# Patient Record
Sex: Male | Born: 1955 | Race: White | Hispanic: No | Smoking: Never smoker
Health system: Southern US, Community
[De-identification: ages and names within clinical notes are randomized; demographics above are authoritative.]

## PROBLEM LIST (undated history)

## (undated) DIAGNOSIS — I10 Essential (primary) hypertension: Secondary | ICD-10-CM

## (undated) DIAGNOSIS — E119 Type 2 diabetes mellitus without complications: Secondary | ICD-10-CM

## (undated) DIAGNOSIS — I499 Cardiac arrhythmia, unspecified: Secondary | ICD-10-CM

## (undated) DIAGNOSIS — M542 Cervicalgia: Secondary | ICD-10-CM

## (undated) DIAGNOSIS — Z95810 Presence of automatic (implantable) cardiac defibrillator: Secondary | ICD-10-CM

## (undated) DIAGNOSIS — C189 Malignant neoplasm of colon, unspecified: Secondary | ICD-10-CM

## (undated) DIAGNOSIS — E785 Hyperlipidemia, unspecified: Secondary | ICD-10-CM

## (undated) DIAGNOSIS — I509 Heart failure, unspecified: Secondary | ICD-10-CM

## (undated) DIAGNOSIS — I429 Cardiomyopathy, unspecified: Secondary | ICD-10-CM

## (undated) HISTORY — DX: Cardiomyopathy, unspecified: I42.9

## (undated) HISTORY — DX: Cardiac arrhythmia, unspecified: I49.9

## (undated) HISTORY — DX: Essential (primary) hypertension: I10

## (undated) HISTORY — DX: Type 2 diabetes mellitus without complications: E11.9

## (undated) HISTORY — DX: Malignant neoplasm of colon, unspecified: C18.9

## (undated) HISTORY — PX: CARDIAC CATHETERIZATION: SHX172

## (undated) HISTORY — DX: Cervicalgia: M54.2

---

## 2003-08-06 HISTORY — PX: ROTATOR CUFF REPAIR: SHX139

## 2007-08-10 ENCOUNTER — Ambulatory Visit: Payer: Self-pay | Admitting: Family Medicine

## 2007-08-11 ENCOUNTER — Ambulatory Visit: Payer: Self-pay | Admitting: Internal Medicine

## 2007-12-15 ENCOUNTER — Ambulatory Visit: Payer: Self-pay

## 2008-01-24 ENCOUNTER — Ambulatory Visit: Payer: Self-pay | Admitting: Orthopaedic Surgery

## 2008-11-09 ENCOUNTER — Inpatient Hospital Stay: Payer: Self-pay | Admitting: Internal Medicine

## 2012-10-05 DIAGNOSIS — C189 Malignant neoplasm of colon, unspecified: Secondary | ICD-10-CM

## 2012-10-05 HISTORY — PX: INSERTION CENTRAL VENOUS ACCESS DEVICE W/ SUBCUTANEOUS PORT: SUR725

## 2012-10-05 HISTORY — DX: Malignant neoplasm of colon, unspecified: C18.9

## 2013-03-30 ENCOUNTER — Observation Stay: Payer: Self-pay | Admitting: Internal Medicine

## 2013-03-30 LAB — COMPREHENSIVE METABOLIC PANEL
BUN: 13 mg/dL (ref 7–18)
Bilirubin,Total: 0.8 mg/dL (ref 0.2–1.0)
Calcium, Total: 10.4 mg/dL — ABNORMAL HIGH (ref 8.5–10.1)
Chloride: 98 mmol/L (ref 98–107)
Co2: 30 mmol/L (ref 21–32)
Creatinine: 1.41 mg/dL — ABNORMAL HIGH (ref 0.60–1.30)
EGFR (African American): >60
EGFR (Non-African Amer.): 55 — ABNORMAL LOW
Potassium: 4.1 mmol/L (ref 3.5–5.1)
SGOT(AST): 32 U/L (ref 15–37)
SGPT (ALT): 51 U/L (ref 12–78)
Sodium: 134 mmol/L — ABNORMAL LOW (ref 136–145)
Total Protein: 8.6 g/dL — ABNORMAL HIGH (ref 6.4–8.2)

## 2013-03-30 LAB — TROPONIN I: Troponin-I: <0.02 ng/mL

## 2013-03-30 LAB — URINALYSIS, COMPLETE
Bacteria: NONE SEEN
Bilirubin,UR: NEGATIVE
Blood: NEGATIVE
Glucose,UR: >=500 mg/dL (ref 0–75)
Ketone: NEGATIVE
Nitrite: NEGATIVE
Ph: 6 (ref 4.5–8.0)
RBC,UR: 3 /HPF (ref 0–5)
Squamous Epithelial: <1

## 2013-03-30 LAB — CBC
HGB: 16 g/dL (ref 13.0–18.0)
MCH: 31.3 pg (ref 26.0–34.0)
RBC: 5.12 10*6/uL (ref 4.40–5.90)
RDW: 13.3 % (ref 11.5–14.5)
WBC: 14.8 10*3/uL — ABNORMAL HIGH (ref 3.8–10.6)

## 2013-03-30 LAB — HEMOGLOBIN A1C: Hemoglobin A1C: 12 % — ABNORMAL HIGH (ref 4.2–6.3)

## 2013-03-30 LAB — BETA-HYDROXYBUTYRIC ACID: Beta-Hydroxybutyrate: 1 mg/dL (ref 0.2–2.8)

## 2013-03-31 LAB — CBC WITH DIFFERENTIAL/PLATELET
Basophil %: 0.9 %
Eosinophil %: 3.5 %
Lymphocyte %: 38.3 %
MCH: 31.2 pg (ref 26.0–34.0)
Monocyte #: 0.8 x10 3/mm (ref 0.2–1.0)
Monocyte %: 7.3 %
Platelet: 165 10*3/uL (ref 150–440)
RDW: 13.1 % (ref 11.5–14.5)
WBC: 10.4 10*3/uL (ref 3.8–10.6)

## 2013-03-31 LAB — BASIC METABOLIC PANEL
Anion Gap: 6 — ABNORMAL LOW (ref 7–16)
BUN: 10 mg/dL (ref 7–18)
Calcium, Total: 8.7 mg/dL (ref 8.5–10.1)
Chloride: 105 mmol/L (ref 98–107)
Creatinine: 1.09 mg/dL (ref 0.60–1.30)
Potassium: 3.4 mmol/L — ABNORMAL LOW (ref 3.5–5.1)

## 2013-04-05 LAB — CULTURE, BLOOD (SINGLE)

## 2013-04-28 ENCOUNTER — Ambulatory Visit: Payer: Self-pay | Admitting: Unknown Physician Specialty

## 2013-05-18 ENCOUNTER — Ambulatory Visit: Payer: Self-pay | Admitting: Surgery

## 2013-05-18 LAB — CBC
HGB: 14.1 g/dL (ref 13.0–18.0)
MCHC: 34.9 g/dL (ref 32.0–36.0)
Platelet: 207 10*3/uL (ref 150–440)
RDW: 13.1 % (ref 11.5–14.5)
WBC: 10.3 10*3/uL (ref 3.8–10.6)

## 2013-05-18 LAB — BASIC METABOLIC PANEL
Calcium, Total: 9.6 mg/dL (ref 8.5–10.1)
Chloride: 106 mmol/L (ref 98–107)
Creatinine: 0.97 mg/dL (ref 0.60–1.30)
Glucose: 166 mg/dL — ABNORMAL HIGH (ref 65–99)
Potassium: 4.2 mmol/L (ref 3.5–5.1)

## 2013-05-25 ENCOUNTER — Inpatient Hospital Stay: Payer: Self-pay | Admitting: Surgery

## 2013-05-25 HISTORY — PX: COLECTOMY: SHX59

## 2013-05-25 LAB — PLATELET COUNT: Platelet: 192 10*3/uL (ref 150–440)

## 2013-05-26 LAB — CBC WITH DIFFERENTIAL/PLATELET
Eosinophil #: 0.3 10*3/uL (ref 0.0–0.7)
Lymphocyte %: 28 %
MCH: 31.4 pg (ref 26.0–34.0)
Monocyte %: 9.5 %
Neutrophil #: 8 10*3/uL — ABNORMAL HIGH (ref 1.4–6.5)
Neutrophil %: 59.6 %
RBC: 4.36 10*6/uL — ABNORMAL LOW (ref 4.40–5.90)

## 2013-05-26 LAB — BASIC METABOLIC PANEL
Anion Gap: 7 (ref 7–16)
BUN: 9 mg/dL (ref 7–18)
Calcium, Total: 8.7 mg/dL (ref 8.5–10.1)
Chloride: 102 mmol/L (ref 98–107)
Co2: 26 mmol/L (ref 21–32)
EGFR (African American): >60
EGFR (Non-African Amer.): >60
Osmolality: 274 (ref 275–301)
Potassium: 3.7 mmol/L (ref 3.5–5.1)
Sodium: 135 mmol/L — ABNORMAL LOW (ref 136–145)

## 2013-05-31 LAB — PATHOLOGY REPORT

## 2013-06-12 ENCOUNTER — Ambulatory Visit: Payer: Self-pay | Admitting: Internal Medicine

## 2013-06-22 DIAGNOSIS — I429 Cardiomyopathy, unspecified: Secondary | ICD-10-CM

## 2013-06-22 DIAGNOSIS — I499 Cardiac arrhythmia, unspecified: Secondary | ICD-10-CM

## 2013-06-22 HISTORY — DX: Cardiomyopathy, unspecified: I42.9

## 2013-06-22 HISTORY — PX: BLADDER SURGERY: SHX569

## 2013-06-22 HISTORY — DX: Cardiac arrhythmia, unspecified: I49.9

## 2013-06-26 ENCOUNTER — Ambulatory Visit: Payer: Self-pay | Admitting: Surgery

## 2013-06-27 LAB — PROTIME-INR: Prothrombin Time: 12.5 secs (ref 11.5–14.7)

## 2013-06-27 LAB — CBC CANCER CENTER
Basophil %: 0.9 %
Eosinophil #: 0.4 x10 3/mm (ref 0.0–0.7)
HGB: 14.9 g/dL (ref 13.0–18.0)
Lymphocyte #: 3.1 x10 3/mm (ref 1.0–3.6)
Lymphocyte %: 34.9 %
MCHC: 33.7 g/dL (ref 32.0–36.0)
Neutrophil %: 51.5 %
RBC: 4.95 10*6/uL (ref 4.40–5.90)
RDW: 13.2 % (ref 11.5–14.5)
WBC: 8.9 x10 3/mm (ref 3.8–10.6)

## 2013-06-27 LAB — COMPREHENSIVE METABOLIC PANEL
Albumin: 3.9 g/dL (ref 3.4–5.0)
Alkaline Phosphatase: 99 U/L (ref 50–136)
Anion Gap: 9 (ref 7–16)
Calcium, Total: 9.9 mg/dL (ref 8.5–10.1)
Co2: 26 mmol/L (ref 21–32)
Creatinine: 1.06 mg/dL (ref 0.60–1.30)
EGFR (African American): >60
EGFR (Non-African Amer.): >60
Potassium: 4.5 mmol/L (ref 3.5–5.1)
SGPT (ALT): 30 U/L (ref 12–78)
Sodium: 135 mmol/L — ABNORMAL LOW (ref 136–145)
Total Protein: 7.9 g/dL (ref 6.4–8.2)

## 2013-06-29 LAB — CEA: CEA: 0.9 ng/mL (ref 0.0–4.7)

## 2013-07-05 ENCOUNTER — Ambulatory Visit: Payer: Self-pay | Admitting: Internal Medicine

## 2013-07-11 ENCOUNTER — Ambulatory Visit: Payer: Self-pay | Admitting: Internal Medicine

## 2013-07-19 LAB — CBC CANCER CENTER
Basophil %: 0.7 %
Eosinophil #: 0.4 x10 3/mm (ref 0.0–0.7)
HCT: 41 % (ref 40.0–52.0)
HGB: 14.1 g/dL (ref 13.0–18.0)
Lymphocyte #: 2.7 x10 3/mm (ref 1.0–3.6)
MCV: 89 fL (ref 80–100)
Neutrophil #: 5.1 x10 3/mm (ref 1.4–6.5)
Neutrophil %: 59 %
Platelet: 190 x10 3/mm (ref 150–440)
RDW: 13.1 % (ref 11.5–14.5)
WBC: 8.7 x10 3/mm (ref 3.8–10.6)

## 2013-07-26 LAB — CBC CANCER CENTER
Basophil #: 0.1 x10 3/mm (ref 0.0–0.1)
Eosinophil #: 0.3 x10 3/mm (ref 0.0–0.7)
Eosinophil %: 5.7 %
Lymphocyte #: 2.4 x10 3/mm (ref 1.0–3.6)
MCH: 30.4 pg (ref 26.0–34.0)
MCHC: 34.4 g/dL (ref 32.0–36.0)
MCV: 88 fL (ref 80–100)
Monocyte #: 0.6 x10 3/mm (ref 0.2–1.0)
Monocyte %: 10.1 %
Neutrophil %: 43.6 %
WBC: 6 x10 3/mm (ref 3.8–10.6)

## 2013-07-26 LAB — COMPREHENSIVE METABOLIC PANEL
Albumin: 3.5 g/dL (ref 3.4–5.0)
Bilirubin,Total: 0.8 mg/dL (ref 0.2–1.0)
Chloride: 104 mmol/L (ref 98–107)
Creatinine: 1.05 mg/dL (ref 0.60–1.30)
EGFR (African American): >60
Glucose: 325 mg/dL — ABNORMAL HIGH (ref 65–99)
Osmolality: 286 (ref 275–301)
Potassium: 4.4 mmol/L (ref 3.5–5.1)
SGOT(AST): 29 U/L (ref 15–37)
SGPT (ALT): 41 U/L (ref 12–78)
Sodium: 138 mmol/L (ref 136–145)
Total Protein: 6.9 g/dL (ref 6.4–8.2)

## 2013-07-31 ENCOUNTER — Ambulatory Visit: Payer: Self-pay

## 2013-08-02 LAB — CBC CANCER CENTER
Basophil #: 0.1 x10 3/mm (ref 0.0–0.1)
Basophil %: 1 %
Eosinophil #: 0.1 x10 3/mm (ref 0.0–0.7)
Eosinophil %: 1.4 %
HCT: 41.4 % (ref 40.0–52.0)
HGB: 14 g/dL (ref 13.0–18.0)
Lymphocyte %: 38.1 %
MCH: 29.7 pg (ref 26.0–34.0)
Monocyte #: 0.8 x10 3/mm (ref 0.2–1.0)
Neutrophil #: 3.7 x10 3/mm (ref 1.4–6.5)
RBC: 4.72 10*6/uL (ref 4.40–5.90)
RDW: 13.1 % (ref 11.5–14.5)

## 2013-08-05 ENCOUNTER — Ambulatory Visit: Payer: Self-pay | Admitting: Internal Medicine

## 2013-08-09 LAB — COMPREHENSIVE METABOLIC PANEL
Albumin: 3.6 g/dL (ref 3.4–5.0)
Anion Gap: 8 (ref 7–16)
BUN: 12 mg/dL (ref 7–18)
Bilirubin,Total: 1 mg/dL (ref 0.2–1.0)
Calcium, Total: 10.2 mg/dL — ABNORMAL HIGH (ref 8.5–10.1)
Chloride: 99 mmol/L (ref 98–107)
Co2: 25 mmol/L (ref 21–32)
EGFR (Non-African Amer.): >60
Glucose: 366 mg/dL — ABNORMAL HIGH (ref 65–99)
SGOT(AST): 29 U/L (ref 15–37)
Sodium: 132 mmol/L — ABNORMAL LOW (ref 136–145)

## 2013-08-09 LAB — CBC CANCER CENTER
Basophil %: 1.3 %
Eosinophil #: 0.1 x10 3/mm (ref 0.0–0.7)
Eosinophil %: 1.2 %
HCT: 43.3 % (ref 40.0–52.0)
Lymphocyte #: 2.8 x10 3/mm (ref 1.0–3.6)
Lymphocyte %: 34.8 %
MCH: 29.5 pg (ref 26.0–34.0)
MCHC: 33.6 g/dL (ref 32.0–36.0)
Monocyte #: 1 x10 3/mm (ref 0.2–1.0)
Neutrophil #: 4.1 x10 3/mm (ref 1.4–6.5)
Neutrophil %: 50.8 %
RBC: 4.94 10*6/uL (ref 4.40–5.90)
WBC: 8.1 x10 3/mm (ref 3.8–10.6)

## 2013-08-23 LAB — CBC CANCER CENTER
Basophil #: 0.1 x10 3/mm (ref 0.0–0.1)
Basophil %: 1.2 %
HCT: 41.4 % (ref 40.0–52.0)
HGB: 13.9 g/dL (ref 13.0–18.0)
Lymphocyte %: 34.7 %
MCH: 29.6 pg (ref 26.0–34.0)
MCHC: 33.6 g/dL (ref 32.0–36.0)
MCV: 88 fL (ref 80–100)
Monocyte %: 11.6 %
Neutrophil #: 3.4 x10 3/mm (ref 1.4–6.5)
Neutrophil %: 50.2 %
Platelet: 80 x10 3/mm — ABNORMAL LOW (ref 150–440)
RBC: 4.71 10*6/uL (ref 4.40–5.90)
RDW: 14.5 % (ref 11.5–14.5)
WBC: 6.8 x10 3/mm (ref 3.8–10.6)

## 2013-08-23 LAB — COMPREHENSIVE METABOLIC PANEL
Albumin: 3.4 g/dL (ref 3.4–5.0)
Alkaline Phosphatase: 89 U/L (ref 50–136)
Anion Gap: 11 (ref 7–16)
Bilirubin,Total: 0.8 mg/dL (ref 0.2–1.0)
Calcium, Total: 9.4 mg/dL (ref 8.5–10.1)
Co2: 26 mmol/L (ref 21–32)
Creatinine: 1.29 mg/dL (ref 0.60–1.30)
EGFR (Non-African Amer.): >60
Glucose: 323 mg/dL — ABNORMAL HIGH (ref 65–99)
Osmolality: 289 (ref 275–301)
Potassium: 3.7 mmol/L (ref 3.5–5.1)
SGOT(AST): 19 U/L (ref 15–37)
Sodium: 138 mmol/L (ref 136–145)
Total Protein: 6.9 g/dL (ref 6.4–8.2)

## 2013-08-23 LAB — GLUCOSE, RANDOM
Glucose: 343 mg/dL — ABNORMAL HIGH (ref 65–99)
Glucose: 299 mg/dL — ABNORMAL HIGH (ref 65–99)

## 2013-08-24 LAB — CEA: CEA: 2.7 ng/mL (ref 0.0–4.7)

## 2013-09-04 ENCOUNTER — Ambulatory Visit: Payer: Self-pay | Admitting: Internal Medicine

## 2013-09-06 LAB — CBC CANCER CENTER
Eosinophil #: 0.1 x10 3/mm (ref 0.0–0.7)
Eosinophil %: 1.8 %
HCT: 41.1 % (ref 40.0–52.0)
HGB: 13.7 g/dL (ref 13.0–18.0)
Lymphocyte %: 31.7 %
MCHC: 33.5 g/dL (ref 32.0–36.0)
MCV: 89 fL (ref 80–100)
Monocyte #: 0.8 x10 3/mm (ref 0.2–1.0)
Neutrophil #: 3.1 x10 3/mm (ref 1.4–6.5)
Neutrophil %: 52.2 %
Platelet: 92 x10 3/mm — ABNORMAL LOW (ref 150–440)
WBC: 6 x10 3/mm (ref 3.8–10.6)

## 2013-09-06 LAB — COMPREHENSIVE METABOLIC PANEL
Albumin: 3.4 g/dL (ref 3.4–5.0)
Alkaline Phosphatase: 77 U/L
Anion Gap: 6 — ABNORMAL LOW (ref 7–16)
BUN: 10 mg/dL (ref 7–18)
Bilirubin,Total: 0.9 mg/dL (ref 0.2–1.0)
Chloride: 101 mmol/L (ref 98–107)
Co2: 27 mmol/L (ref 21–32)
Creatinine: 1.09 mg/dL (ref 0.60–1.30)
EGFR (African American): >60
Glucose: 306 mg/dL — ABNORMAL HIGH (ref 65–99)
Osmolality: 279 (ref 275–301)
SGPT (ALT): 40 U/L (ref 12–78)
Sodium: 134 mmol/L — ABNORMAL LOW (ref 136–145)
Total Protein: 7.1 g/dL (ref 6.4–8.2)

## 2013-09-20 LAB — COMPREHENSIVE METABOLIC PANEL
Anion Gap: 11 (ref 7–16)
Bilirubin,Total: 0.5 mg/dL (ref 0.2–1.0)
Calcium, Total: 9.1 mg/dL (ref 8.5–10.1)
Chloride: 99 mmol/L (ref 98–107)
Co2: 24 mmol/L (ref 21–32)
Creatinine: 1.25 mg/dL (ref 0.60–1.30)
EGFR (African American): >60
EGFR (Non-African Amer.): >60
Glucose: 443 mg/dL — ABNORMAL HIGH (ref 65–99)
Osmolality: 286 (ref 275–301)
Potassium: 4 mmol/L (ref 3.5–5.1)
SGOT(AST): 27 U/L (ref 15–37)
SGPT (ALT): 43 U/L (ref 12–78)
Sodium: 134 mmol/L — ABNORMAL LOW (ref 136–145)

## 2013-09-20 LAB — CBC CANCER CENTER
Eosinophil #: 0.1 x10 3/mm (ref 0.0–0.7)
Eosinophil %: 1.6 %
HCT: 39.9 % — ABNORMAL LOW (ref 40.0–52.0)
Lymphocyte #: 2.1 x10 3/mm (ref 1.0–3.6)
Lymphocyte %: 37.3 %
Monocyte #: 0.8 x10 3/mm (ref 0.2–1.0)
Monocyte %: 14.1 %
Neutrophil #: 2.6 x10 3/mm (ref 1.4–6.5)
Neutrophil %: 45.9 %
Platelet: 72 x10 3/mm — ABNORMAL LOW (ref 150–440)

## 2013-09-22 LAB — CBC CANCER CENTER
Basophil #: 0.1 x10 3/mm (ref 0.0–0.1)
Basophil %: 1.2 %
Eosinophil %: 1.7 %
HCT: 40 % (ref 40.0–52.0)
Lymphocyte #: 1.8 x10 3/mm (ref 1.0–3.6)
Lymphocyte %: 35.7 %
MCHC: 32.5 g/dL (ref 32.0–36.0)
Neutrophil #: 2.3 x10 3/mm (ref 1.4–6.5)
Platelet: 75 x10 3/mm — ABNORMAL LOW (ref 150–440)
RDW: 18.2 % — ABNORMAL HIGH (ref 11.5–14.5)

## 2013-09-22 LAB — BASIC METABOLIC PANEL
Anion Gap: 9 (ref 7–16)
BUN: 8 mg/dL (ref 7–18)
Calcium, Total: 9.1 mg/dL (ref 8.5–10.1)
EGFR (Non-African Amer.): >60
Osmolality: 281 (ref 275–301)
Potassium: 4 mmol/L (ref 3.5–5.1)
Sodium: 135 mmol/L — ABNORMAL LOW (ref 136–145)

## 2013-10-05 ENCOUNTER — Ambulatory Visit: Payer: Self-pay | Admitting: Internal Medicine

## 2013-10-18 LAB — COMPREHENSIVE METABOLIC PANEL
ALBUMIN: 3.6 g/dL (ref 3.4–5.0)
ANION GAP: 5 — AB (ref 7–16)
AST: 37 U/L (ref 15–37)
Alkaline Phosphatase: 96 U/L
BUN: 7 mg/dL (ref 7–18)
Bilirubin,Total: 0.6 mg/dL (ref 0.2–1.0)
CHLORIDE: 101 mmol/L (ref 98–107)
CO2: 26 mmol/L (ref 21–32)
Calcium, Total: 9.8 mg/dL (ref 8.5–10.1)
Creatinine: 0.98 mg/dL (ref 0.60–1.30)
GLUCOSE: 278 mg/dL — AB (ref 65–99)
Osmolality: 272 (ref 275–301)
Potassium: 4.2 mmol/L (ref 3.5–5.1)
SGPT (ALT): 42 U/L (ref 12–78)
Sodium: 132 mmol/L — ABNORMAL LOW (ref 136–145)
Total Protein: 8.3 g/dL — ABNORMAL HIGH (ref 6.4–8.2)

## 2013-10-18 LAB — CBC CANCER CENTER
BASOS PCT: 0.9 %
Basophil #: 0.1 x10 3/mm (ref 0.0–0.1)
EOS ABS: 0.2 x10 3/mm (ref 0.0–0.7)
EOS PCT: 1.4 %
HCT: 44.1 % (ref 40.0–52.0)
HGB: 14.3 g/dL (ref 13.0–18.0)
LYMPHS ABS: 4.5 x10 3/mm — AB (ref 1.0–3.6)
LYMPHS PCT: 28.7 %
MCH: 30.4 pg (ref 26.0–34.0)
MCHC: 32.5 g/dL (ref 32.0–36.0)
MCV: 93 fL (ref 80–100)
MONO ABS: 1.2 x10 3/mm — AB (ref 0.2–1.0)
MONOS PCT: 8 %
NEUTROS ABS: 9.5 x10 3/mm — AB (ref 1.4–6.5)
NEUTROS PCT: 61 %
Platelet: 140 x10 3/mm — ABNORMAL LOW (ref 150–440)
RBC: 4.72 10*6/uL (ref 4.40–5.90)
RDW: 18.5 % — ABNORMAL HIGH (ref 11.5–14.5)
WBC: 15.6 x10 3/mm — ABNORMAL HIGH (ref 3.8–10.6)

## 2013-10-19 LAB — CEA: CEA: 3.2 ng/mL (ref 0.0–4.7)

## 2013-11-05 ENCOUNTER — Ambulatory Visit: Payer: Self-pay | Admitting: Internal Medicine

## 2013-12-15 ENCOUNTER — Ambulatory Visit: Payer: Self-pay | Admitting: Internal Medicine

## 2013-12-18 ENCOUNTER — Ambulatory Visit: Payer: Self-pay | Admitting: Internal Medicine

## 2013-12-18 LAB — CBC CANCER CENTER
BASOS ABS: 0.1 x10 3/mm (ref 0.0–0.1)
Basophil %: 1.3 %
EOS ABS: 0.4 x10 3/mm (ref 0.0–0.7)
Eosinophil %: 3.9 %
HCT: 42.4 % (ref 40.0–52.0)
HGB: 14.1 g/dL (ref 13.0–18.0)
LYMPHS ABS: 2.7 x10 3/mm (ref 1.0–3.6)
Lymphocyte %: 27.2 %
MCH: 31 pg (ref 26.0–34.0)
MCHC: 33.3 g/dL (ref 32.0–36.0)
MCV: 93 fL (ref 80–100)
Monocyte #: 0.7 x10 3/mm (ref 0.2–1.0)
Monocyte %: 6.7 %
NEUTROS ABS: 6.1 x10 3/mm (ref 1.4–6.5)
Neutrophil %: 60.9 %
Platelet: 246 x10 3/mm (ref 150–440)
RBC: 4.56 10*6/uL (ref 4.40–5.90)
RDW: 14 % (ref 11.5–14.5)
WBC: 10 x10 3/mm (ref 3.8–10.6)

## 2013-12-18 LAB — COMPREHENSIVE METABOLIC PANEL
ALBUMIN: 3.7 g/dL (ref 3.4–5.0)
ANION GAP: 9 (ref 7–16)
Alkaline Phosphatase: 77 U/L
BILIRUBIN TOTAL: 0.4 mg/dL (ref 0.2–1.0)
BUN: 9 mg/dL (ref 7–18)
CHLORIDE: 99 mmol/L (ref 98–107)
Calcium, Total: 10 mg/dL (ref 8.5–10.1)
Co2: 26 mmol/L (ref 21–32)
Creatinine: 1.17 mg/dL (ref 0.60–1.30)
GLUCOSE: 285 mg/dL — AB (ref 65–99)
Osmolality: 277 (ref 275–301)
Potassium: 4.6 mmol/L (ref 3.5–5.1)
SGOT(AST): 57 U/L — ABNORMAL HIGH (ref 15–37)
SGPT (ALT): 64 U/L (ref 12–78)
Sodium: 134 mmol/L — ABNORMAL LOW (ref 136–145)
Total Protein: 8.1 g/dL (ref 6.4–8.2)

## 2013-12-20 LAB — CEA: CEA: 1.6 ng/mL (ref 0.0–4.7)

## 2013-12-22 IMAGING — CT CT ABD-PELV W/ CM
1 of 3 series · 13 of 32 positions shown, 18 images · non-contrast
Comparison: none

REASON FOR EXAM: neoplasm colon
COMMENTS:

PROCEDURE:     CT  - CT ABDOMEN / PELVIS  W  - June 27, 2013 [DATE]
RESULT:
TECHNIQUE: Helical 3 mm sections were obtained from the lung bases through
the pubic symphysis status post intravenous administration of 100 ml of
Ysovue-6MM and oral contrast.

[Series 2: 3mm soft tissue · axial · 0.90mm/px · z∈[-1030,-570]mm · 13 of 173 slices shown, 18 images]
[im 10/173  soft-tissue]
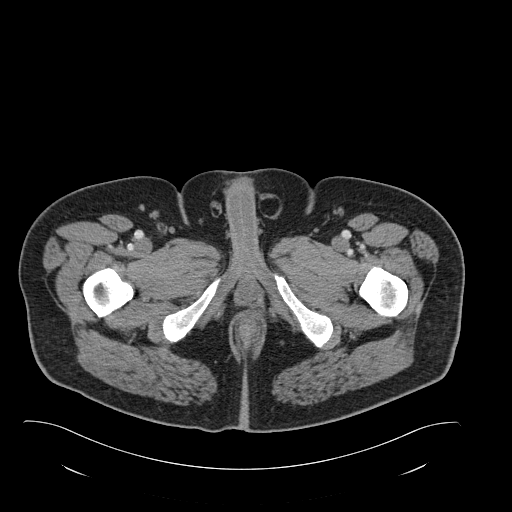
[im 10/173  bone]
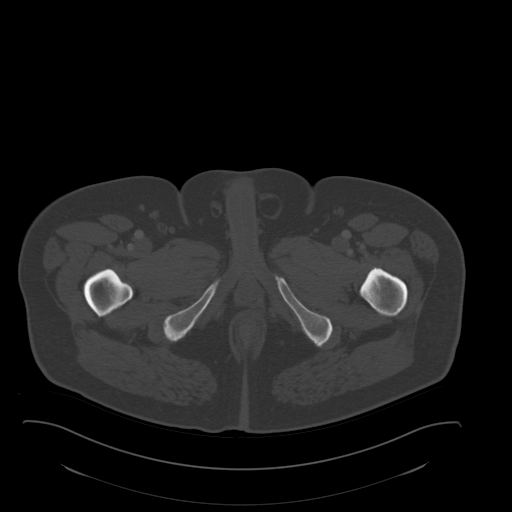
[im 28/173  soft-tissue]
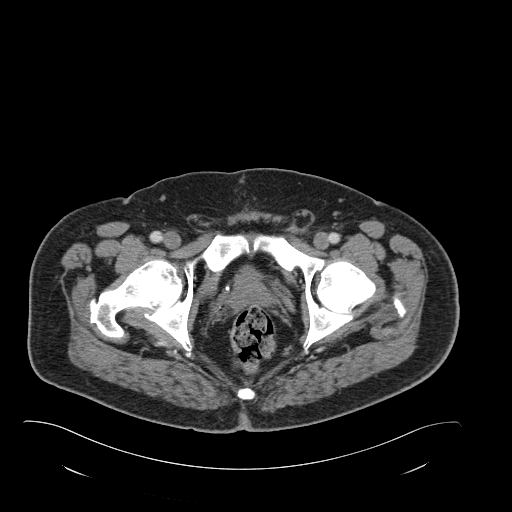
[im 37/173  soft-tissue]
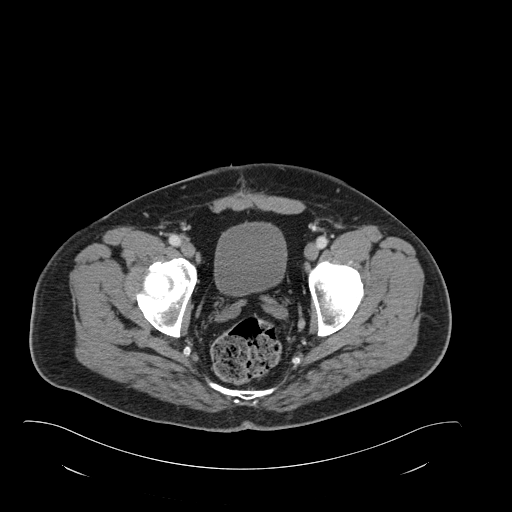
[im 55/173  soft-tissue]
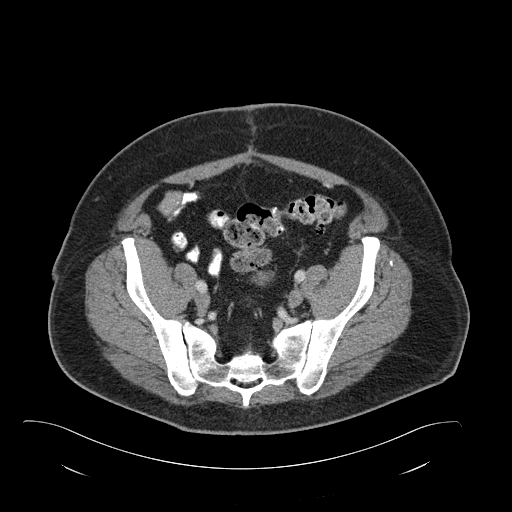
[im 64/173  soft-tissue]
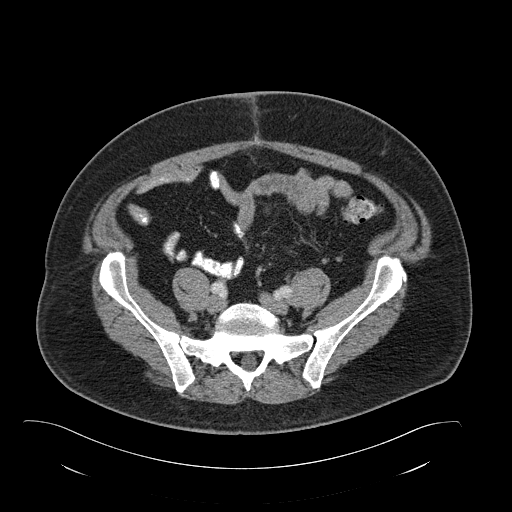
[im 82/173  soft-tissue]
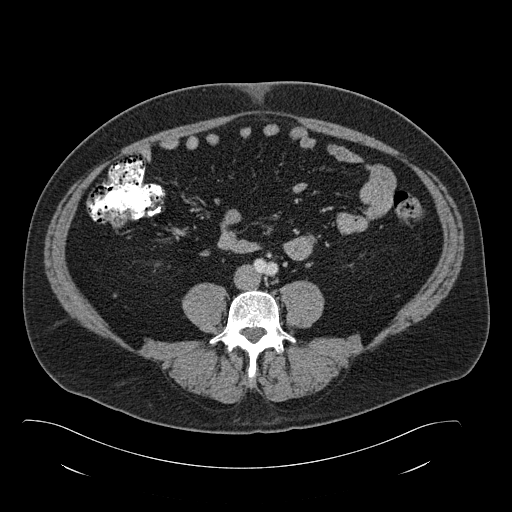
[im 91/173  soft-tissue]
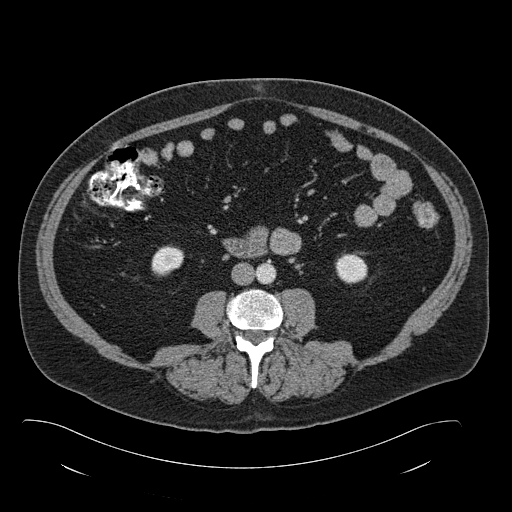
[im 109/173  soft-tissue]
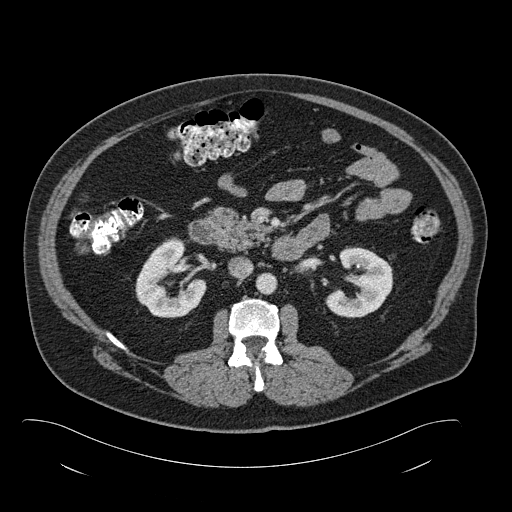
[im 118/173  soft-tissue]
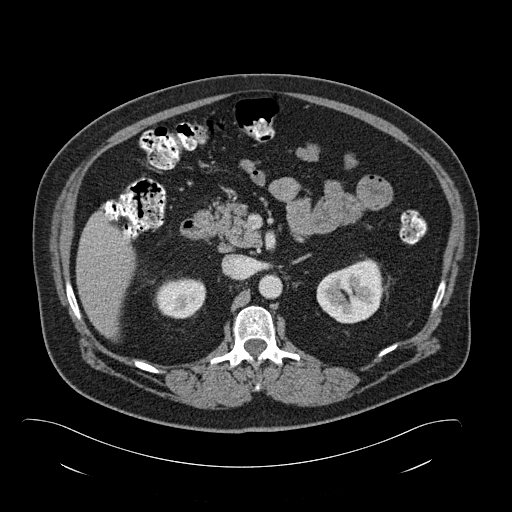
[im 118/173  bone]
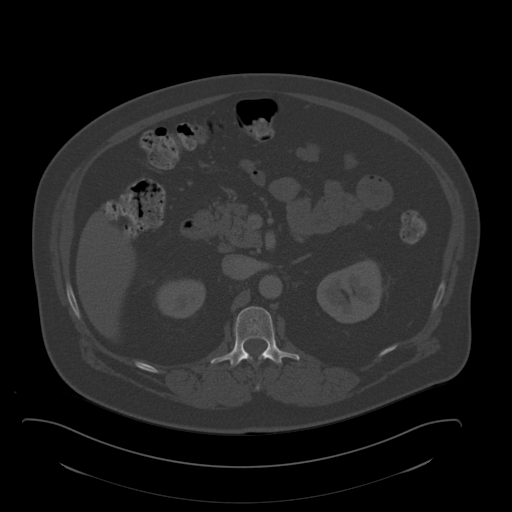
[im 136/173  soft-tissue]
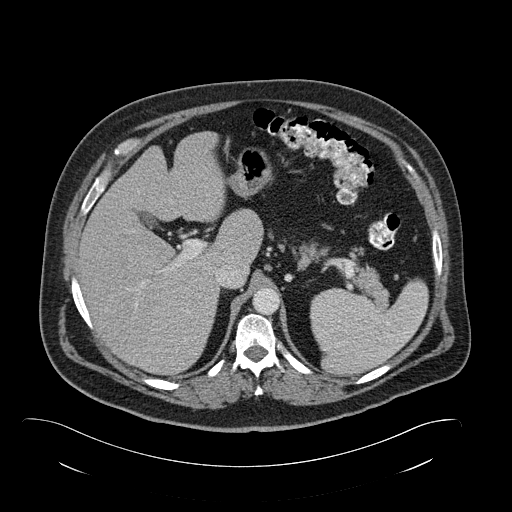
[im 136/173  lung]
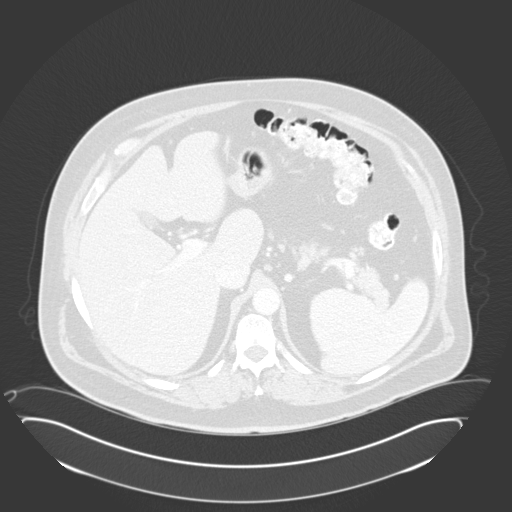
[im 145/173  soft-tissue]
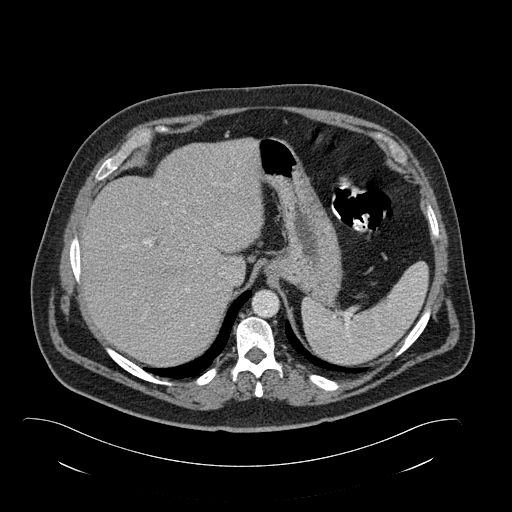
[im 145/173  lung]
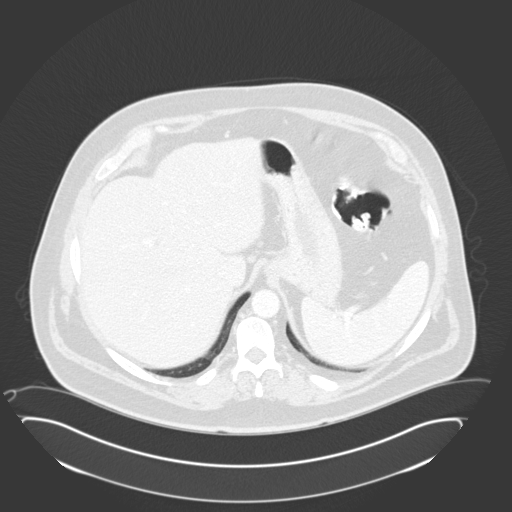
[im 154/173  lung]
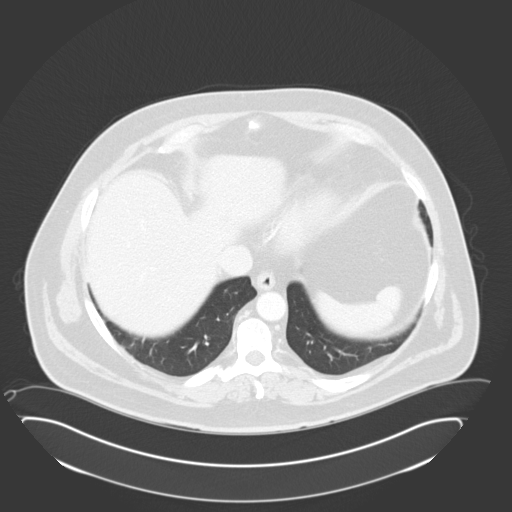
[im 163/173  soft-tissue]
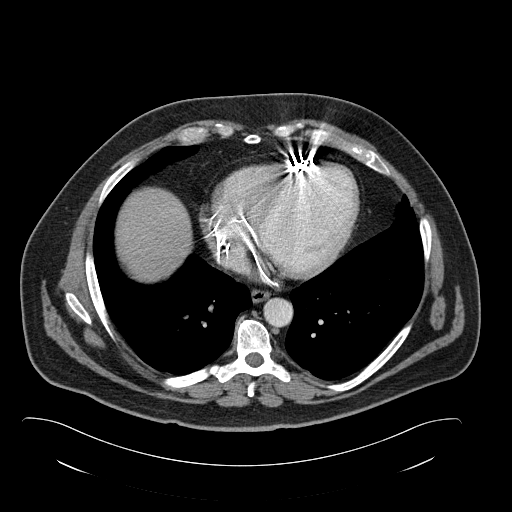
[im 163/173  lung]
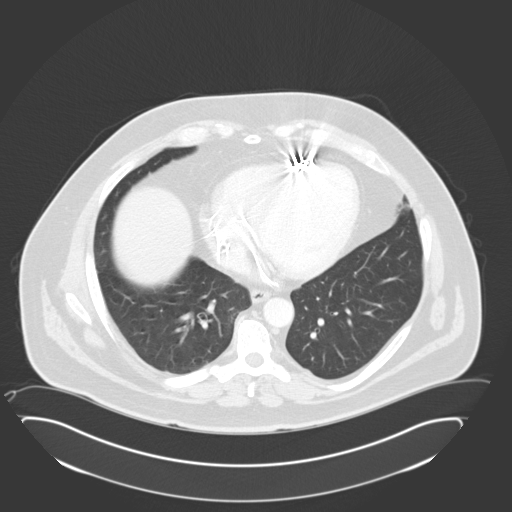

[13 of 32 positions shown; findings below may reference images not displayed]

FINDINGS: The lung bases are unremarkable.

There is mild diffuse low attenuation about the liver architecture. Within
the anterior subcapsular region of the left lobe of the liver, a vague area
of low attenuation is appreciated. This area vaguely persists on delayed
imaging and measures approximately 1.4 cm in diameter, appreciated on image
#34 of the soft tissue series. Considering the patient's history,
surveillance evaluation of this lesion is recommended. If clinically
warranted, further evaluation with liver MRI may help to further
characterize this finding. The liver is otherwise unremarkable. Calcified
gallstone is appreciated within the neck of the gallbladder.

The spleen, adrenals, and pancreas are unremarkable. Nonobstructing calculi
are identified within the left kidney, as well as a small, 1 cm, exophytic,
nodular focus along the posterior midpole and a cyst within the anterior
lower pole. The right kidney is unremarkable. There is no CT evidence of
bowel obstruction, enteritis, colitis or diverticulitis. A moderate amount
of fecal retention is identified within the sigmoid colon. There is
otherwise no evidence of abdominal masses, free fluid, loculated fluid
collections or adenopathy. There is no evidence of an abdominal aortic
aneurysm. The celiac, SMA, IMA, portal vein, and SMV are opacified.
IMPRESSION: 1.  Indeterminate vague finding within the anterior aspect of the left lobe
of liver as described above. Correlation with CEA levels is recommended as
well as liver function tests including alpha-fetal protein, if clinically
warranted. Further evaluation with MRI may possibly help to further
characterize this lesion or alternatively, continued surveillance CT
evaluation can be obtained.
2.  Gallstone within the neck of the gallbladder.
3.  Nonobstructing calculi, left kidney, indeterminate nodule too small for
characterization and cyst.
4.  Fecal retention.

## 2014-01-03 ENCOUNTER — Ambulatory Visit: Payer: Self-pay | Admitting: Internal Medicine

## 2014-01-22 ENCOUNTER — Inpatient Hospital Stay: Payer: Self-pay | Admitting: Surgery

## 2014-01-22 HISTORY — PX: CHOLECYSTECTOMY: SHX55

## 2014-01-22 LAB — CBC WITH DIFFERENTIAL/PLATELET
Basophil #: 0.1 10*3/uL (ref 0.0–0.1)
Basophil %: 0.4 %
Eosinophil #: 0 10*3/uL (ref 0.0–0.7)
Eosinophil %: 0.1 %
HCT: 45.3 % (ref 40.0–52.0)
HGB: 15.3 g/dL (ref 13.0–18.0)
LYMPHS ABS: 2 10*3/uL (ref 1.0–3.6)
Lymphocyte %: 9.9 %
MCH: 31 pg (ref 26.0–34.0)
MCHC: 33.8 g/dL (ref 32.0–36.0)
MCV: 92 fL (ref 80–100)
Monocyte #: 1.3 x10 3/mm — ABNORMAL HIGH (ref 0.2–1.0)
Monocyte %: 6.6 %
Neutrophil #: 16.5 10*3/uL — ABNORMAL HIGH (ref 1.4–6.5)
Neutrophil %: 83 %
Platelet: 214 10*3/uL (ref 150–440)
RBC: 4.93 10*6/uL (ref 4.40–5.90)
RDW: 14.1 % (ref 11.5–14.5)
WBC: 19.9 10*3/uL — AB (ref 3.8–10.6)

## 2014-01-22 LAB — COMPREHENSIVE METABOLIC PANEL
ALBUMIN: 4.4 g/dL (ref 3.4–5.0)
ALK PHOS: 74 U/L
ALT: 62 U/L (ref 12–78)
Anion Gap: 4 — ABNORMAL LOW (ref 7–16)
BILIRUBIN TOTAL: 1.1 mg/dL — AB (ref 0.2–1.0)
BUN: 11 mg/dL (ref 7–18)
CALCIUM: 9.6 mg/dL (ref 8.5–10.1)
CREATININE: 0.95 mg/dL (ref 0.60–1.30)
Chloride: 100 mmol/L (ref 98–107)
Co2: 27 mmol/L (ref 21–32)
EGFR (African American): >60
EGFR (Non-African Amer.): >60
GLUCOSE: 325 mg/dL — AB (ref 65–99)
Osmolality: 275 (ref 275–301)
POTASSIUM: 5.1 mmol/L (ref 3.5–5.1)
SGOT(AST): 39 U/L — ABNORMAL HIGH (ref 15–37)
Sodium: 131 mmol/L — ABNORMAL LOW (ref 136–145)
Total Protein: 9.2 g/dL — ABNORMAL HIGH (ref 6.4–8.2)

## 2014-01-22 LAB — URINALYSIS, COMPLETE
BACTERIA: NONE SEEN
BLOOD: NEGATIVE
Bilirubin,UR: NEGATIVE
LEUKOCYTE ESTERASE: NEGATIVE
Nitrite: NEGATIVE
PROTEIN: NEGATIVE
Ph: 5 (ref 4.5–8.0)
RBC, UR: NONE SEEN /HPF (ref 0–5)
SQUAMOUS EPITHELIAL: NONE SEEN
Specific Gravity: 1.03 (ref 1.003–1.030)
WBC UR: 1 /HPF (ref 0–5)

## 2014-01-22 LAB — LIPASE, BLOOD: Lipase: 270 U/L (ref 73–393)

## 2014-01-23 LAB — COMPREHENSIVE METABOLIC PANEL
Albumin: 3.5 g/dL (ref 3.4–5.0)
Alkaline Phosphatase: 56 U/L
Anion Gap: 2 — ABNORMAL LOW (ref 7–16)
BILIRUBIN TOTAL: 0.9 mg/dL (ref 0.2–1.0)
BUN: 8 mg/dL (ref 7–18)
CALCIUM: 8.6 mg/dL (ref 8.5–10.1)
CHLORIDE: 103 mmol/L (ref 98–107)
CO2: 30 mmol/L (ref 21–32)
Creatinine: 0.79 mg/dL (ref 0.60–1.30)
GLUCOSE: 194 mg/dL — AB (ref 65–99)
Osmolality: 274 (ref 275–301)
Potassium: 4 mmol/L (ref 3.5–5.1)
SGOT(AST): 32 U/L (ref 15–37)
SGPT (ALT): 47 U/L (ref 12–78)
SODIUM: 135 mmol/L — AB (ref 136–145)
TOTAL PROTEIN: 7.3 g/dL (ref 6.4–8.2)

## 2014-01-23 LAB — CBC WITH DIFFERENTIAL/PLATELET
Basophil #: 0 10*3/uL (ref 0.0–0.1)
Basophil %: 0.3 %
EOS ABS: 0.3 10*3/uL (ref 0.0–0.7)
EOS PCT: 2.1 %
HCT: 38.7 % — ABNORMAL LOW (ref 40.0–52.0)
HGB: 13 g/dL (ref 13.0–18.0)
LYMPHS PCT: 23 %
Lymphocyte #: 2.9 10*3/uL (ref 1.0–3.6)
MCH: 31 pg (ref 26.0–34.0)
MCHC: 33.6 g/dL (ref 32.0–36.0)
MCV: 92 fL (ref 80–100)
MONO ABS: 1.6 x10 3/mm — AB (ref 0.2–1.0)
Monocyte %: 12.7 %
Neutrophil #: 7.8 10*3/uL — ABNORMAL HIGH (ref 1.4–6.5)
Neutrophil %: 61.9 %
PLATELETS: 158 10*3/uL (ref 150–440)
RBC: 4.19 10*6/uL — AB (ref 4.40–5.90)
RDW: 14.3 % (ref 11.5–14.5)
WBC: 12.6 10*3/uL — ABNORMAL HIGH (ref 3.8–10.6)

## 2014-01-26 LAB — PATHOLOGY REPORT

## 2014-01-27 LAB — CULTURE, BLOOD (SINGLE)

## 2014-02-02 ENCOUNTER — Ambulatory Visit: Payer: Self-pay | Admitting: Internal Medicine

## 2014-03-05 ENCOUNTER — Ambulatory Visit: Payer: Self-pay | Admitting: Internal Medicine

## 2014-04-09 ENCOUNTER — Ambulatory Visit: Payer: Self-pay | Admitting: Internal Medicine

## 2014-04-09 LAB — BASIC METABOLIC PANEL
ANION GAP: 10 (ref 7–16)
BUN: 13 mg/dL (ref 7–18)
Calcium, Total: 9.3 mg/dL (ref 8.5–10.1)
Chloride: 101 mmol/L (ref 98–107)
Co2: 27 mmol/L (ref 21–32)
Creatinine: 1.34 mg/dL — ABNORMAL HIGH (ref 0.60–1.30)
EGFR (African American): >60
GFR CALC NON AF AMER: 58 — AB
Glucose: 425 mg/dL — ABNORMAL HIGH (ref 65–99)
Osmolality: 294 (ref 275–301)
Potassium: 4.5 mmol/L (ref 3.5–5.1)
Sodium: 138 mmol/L (ref 136–145)

## 2014-04-09 LAB — CBC CANCER CENTER
Basophil #: 0.1 x10 3/mm (ref 0.0–0.1)
Basophil %: 0.5 %
Eosinophil #: 0.4 x10 3/mm (ref 0.0–0.7)
Eosinophil %: 3.9 %
HCT: 40.8 % (ref 40.0–52.0)
HGB: 13.6 g/dL (ref 13.0–18.0)
LYMPHS ABS: 3.2 x10 3/mm (ref 1.0–3.6)
Lymphocyte %: 28.5 %
MCH: 30.6 pg (ref 26.0–34.0)
MCHC: 33.3 g/dL (ref 32.0–36.0)
MCV: 92 fL (ref 80–100)
MONO ABS: 0.9 x10 3/mm (ref 0.2–1.0)
Monocyte %: 7.7 %
Neutrophil #: 6.6 x10 3/mm — ABNORMAL HIGH (ref 1.4–6.5)
Neutrophil %: 59.4 %
PLATELETS: 182 x10 3/mm (ref 150–440)
RBC: 4.44 10*6/uL (ref 4.40–5.90)
RDW: 14.1 % (ref 11.5–14.5)
WBC: 11.1 x10 3/mm — AB (ref 3.8–10.6)

## 2014-04-10 LAB — CEA: CEA: 1.5 ng/mL (ref 0.0–4.7)

## 2014-04-20 DIAGNOSIS — Z95810 Presence of automatic (implantable) cardiac defibrillator: Secondary | ICD-10-CM

## 2014-04-20 HISTORY — DX: Presence of automatic (implantable) cardiac defibrillator: Z95.810

## 2014-04-24 ENCOUNTER — Ambulatory Visit: Payer: Self-pay | Admitting: Cardiology

## 2014-04-24 DIAGNOSIS — Z45018 Encounter for adjustment and management of other part of cardiac pacemaker: Secondary | ICD-10-CM

## 2014-04-24 LAB — URINALYSIS, COMPLETE
BILIRUBIN, UR: NEGATIVE
Bacteria: NONE SEEN
Blood: NEGATIVE
Ketone: NEGATIVE
Leukocyte Esterase: NEGATIVE
NITRITE: NEGATIVE
PH: 5 (ref 4.5–8.0)
Protein: NEGATIVE
SQUAMOUS EPITHELIAL: NONE SEEN
Specific Gravity: 1.012 (ref 1.003–1.030)
WBC UR: 1 /HPF (ref 0–5)

## 2014-04-24 LAB — BASIC METABOLIC PANEL
Anion Gap: 4 — ABNORMAL LOW (ref 7–16)
BUN: 9 mg/dL (ref 7–18)
CHLORIDE: 103 mmol/L (ref 98–107)
Calcium, Total: 9.4 mg/dL (ref 8.5–10.1)
Co2: 29 mmol/L (ref 21–32)
Creatinine: 1 mg/dL (ref 0.60–1.30)
EGFR (Non-African Amer.): >60
Glucose: 194 mg/dL — ABNORMAL HIGH (ref 65–99)
Osmolality: 276 (ref 275–301)
Potassium: 4.2 mmol/L (ref 3.5–5.1)
SODIUM: 136 mmol/L (ref 136–145)

## 2014-04-24 LAB — CBC WITH DIFFERENTIAL/PLATELET
BASOS PCT: 0.9 %
Basophil #: 0.1 10*3/uL (ref 0.0–0.1)
EOS ABS: 0.4 10*3/uL (ref 0.0–0.7)
Eosinophil %: 3.8 %
HCT: 41.6 % (ref 40.0–52.0)
HGB: 13.8 g/dL (ref 13.0–18.0)
LYMPHS PCT: 33.2 %
Lymphocyte #: 3.8 10*3/uL — ABNORMAL HIGH (ref 1.0–3.6)
MCH: 30.4 pg (ref 26.0–34.0)
MCHC: 33.1 g/dL (ref 32.0–36.0)
MCV: 92 fL (ref 80–100)
Monocyte #: 1 x10 3/mm (ref 0.2–1.0)
Monocyte %: 9.1 %
Neutrophil #: 6.1 10*3/uL (ref 1.4–6.5)
Neutrophil %: 53 %
Platelet: 190 10*3/uL (ref 150–440)
RBC: 4.53 10*6/uL (ref 4.40–5.90)
RDW: 13.4 % (ref 11.5–14.5)
WBC: 11.5 10*3/uL — AB (ref 3.8–10.6)

## 2014-04-24 LAB — PROTIME-INR
INR: 1
Prothrombin Time: 13.1 secs (ref 11.5–14.7)

## 2014-04-24 LAB — APTT: ACTIVATED PTT: 27.5 s (ref 23.6–35.9)

## 2014-04-27 ENCOUNTER — Ambulatory Visit: Payer: Self-pay | Admitting: Cardiology

## 2014-04-27 HISTORY — PX: OTHER SURGICAL HISTORY: SHX169

## 2014-05-05 ENCOUNTER — Ambulatory Visit: Payer: Self-pay | Admitting: Internal Medicine

## 2014-06-01 ENCOUNTER — Ambulatory Visit: Payer: Self-pay | Admitting: Unknown Physician Specialty

## 2014-06-04 LAB — PATHOLOGY REPORT

## 2014-06-05 ENCOUNTER — Ambulatory Visit: Payer: Self-pay | Admitting: Internal Medicine

## 2014-07-05 ENCOUNTER — Ambulatory Visit: Payer: Self-pay | Admitting: Internal Medicine

## 2014-07-09 LAB — CBC CANCER CENTER
Basophil #: 0.1 x10 3/mm (ref 0.0–0.1)
Basophil %: 1.2 %
EOS PCT: 4.1 %
Eosinophil #: 0.5 x10 3/mm (ref 0.0–0.7)
HCT: 42.9 % (ref 40.0–52.0)
HGB: 14.4 g/dL (ref 13.0–18.0)
LYMPHS ABS: 3.1 x10 3/mm (ref 1.0–3.6)
LYMPHS PCT: 26.8 %
MCH: 30.2 pg (ref 26.0–34.0)
MCHC: 33.5 g/dL (ref 32.0–36.0)
MCV: 90 fL (ref 80–100)
Monocyte #: 0.8 x10 3/mm (ref 0.2–1.0)
Monocyte %: 7 %
Neutrophil #: 7 x10 3/mm — ABNORMAL HIGH (ref 1.4–6.5)
Neutrophil %: 60.9 %
PLATELETS: 184 x10 3/mm (ref 150–440)
RBC: 4.76 10*6/uL (ref 4.40–5.90)
RDW: 13.1 % (ref 11.5–14.5)
WBC: 11.6 x10 3/mm — ABNORMAL HIGH (ref 3.8–10.6)

## 2014-07-09 LAB — COMPREHENSIVE METABOLIC PANEL
ALBUMIN: 3.8 g/dL (ref 3.4–5.0)
ANION GAP: 8 (ref 7–16)
Alkaline Phosphatase: 62 U/L
BILIRUBIN TOTAL: 0.8 mg/dL (ref 0.2–1.0)
BUN: 15 mg/dL (ref 7–18)
CO2: 27 mmol/L (ref 21–32)
Calcium, Total: 9.8 mg/dL (ref 8.5–10.1)
Chloride: 100 mmol/L (ref 98–107)
Creatinine: 1.36 mg/dL — ABNORMAL HIGH (ref 0.60–1.30)
EGFR (African American): >60
GFR CALC NON AF AMER: 57 — AB
GLUCOSE: 307 mg/dL — AB (ref 65–99)
OSMOLALITY: 283 (ref 275–301)
POTASSIUM: 4.6 mmol/L (ref 3.5–5.1)
SGOT(AST): 28 U/L (ref 15–37)
SGPT (ALT): 49 U/L
Sodium: 135 mmol/L — ABNORMAL LOW (ref 136–145)
Total Protein: 7.3 g/dL (ref 6.4–8.2)

## 2014-07-10 LAB — CEA: CEA: 0.9 ng/mL (ref 0.0–4.7)

## 2014-08-05 ENCOUNTER — Ambulatory Visit: Payer: Self-pay | Admitting: Internal Medicine

## 2014-09-04 ENCOUNTER — Ambulatory Visit: Payer: Self-pay | Admitting: Internal Medicine

## 2014-10-05 ENCOUNTER — Ambulatory Visit: Payer: Self-pay | Admitting: Internal Medicine

## 2014-10-08 LAB — COMPREHENSIVE METABOLIC PANEL
ANION GAP: 7 (ref 7–16)
Albumin: 3.9 g/dL (ref 3.4–5.0)
Alkaline Phosphatase: 67 U/L
BUN: 11 mg/dL (ref 7–18)
Bilirubin,Total: 0.8 mg/dL (ref 0.2–1.0)
CHLORIDE: 102 mmol/L (ref 98–107)
CO2: 28 mmol/L (ref 21–32)
Calcium, Total: 9.3 mg/dL (ref 8.5–10.1)
Creatinine: 1.16 mg/dL (ref 0.60–1.30)
Glucose: 207 mg/dL — ABNORMAL HIGH (ref 65–99)
OSMOLALITY: 279 (ref 275–301)
Potassium: 4.3 mmol/L (ref 3.5–5.1)
SGOT(AST): 24 U/L (ref 15–37)
SGPT (ALT): 46 U/L
SODIUM: 137 mmol/L (ref 136–145)
Total Protein: 7.8 g/dL (ref 6.4–8.2)

## 2014-10-08 LAB — CBC CANCER CENTER
Basophil #: 0.1 x10 3/mm (ref 0.0–0.1)
Basophil %: 0.7 %
EOS ABS: 0.5 x10 3/mm (ref 0.0–0.7)
Eosinophil %: 4.4 %
HCT: 42.8 % (ref 40.0–52.0)
HGB: 14.5 g/dL (ref 13.0–18.0)
Lymphocyte #: 2.8 x10 3/mm (ref 1.0–3.6)
Lymphocyte %: 26.4 %
MCH: 30.3 pg (ref 26.0–34.0)
MCHC: 33.8 g/dL (ref 32.0–36.0)
MCV: 90 fL (ref 80–100)
MONO ABS: 0.8 x10 3/mm (ref 0.2–1.0)
MONOS PCT: 7.4 %
Neutrophil #: 6.5 x10 3/mm (ref 1.4–6.5)
Neutrophil %: 61.1 %
PLATELETS: 189 x10 3/mm (ref 150–440)
RBC: 4.77 10*6/uL (ref 4.40–5.90)
RDW: 13.4 % (ref 11.5–14.5)
WBC: 10.7 x10 3/mm — AB (ref 3.8–10.6)

## 2014-10-09 LAB — CEA: CEA: 1 ng/mL (ref 0.0–4.7)

## 2014-11-05 ENCOUNTER — Ambulatory Visit: Payer: Self-pay | Admitting: Family Medicine

## 2014-11-05 ENCOUNTER — Ambulatory Visit: Payer: Self-pay | Admitting: Internal Medicine

## 2014-12-21 IMAGING — CT CT CHEST-ABD-PELV W/ CM
2 of 5 series · 15 of 46 positions shown, 17 images · IV contrast (isovue)
Comparison: 01/22/2014 abdominal pelvic CT. 12/18/2013 chest,
abdomen, and pelvic CT. Chest radiograph of 04/24/2014.

CLINICAL DATA: Colon cancer with partial colonic resectionm
chemotherapy. No current complaints. Status post sigmoid colectomy
05/25/2013.

EXAM:
CT CHEST, ABDOMEN, AND PELVIS WITH CONTRAST
TECHNIQUE: Multidetector CT imaging of the chest, abdomen and pelvis was
performed following the standard protocol during bolus
administration of intravenous contrast.
CONTRAST:  125 cc Isovue 370

[Series 2: cap with · axial · 0.92mm/px · z∈[-1124,-534]mm · 12 of 134 slices shown, 14 images]
[im 8/134  soft-tissue]
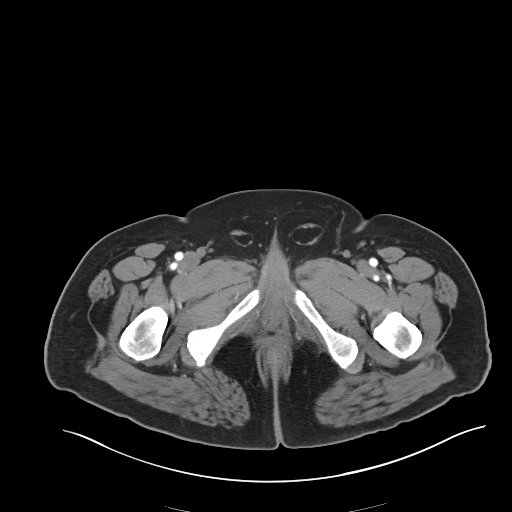
[im 8/134  bone]
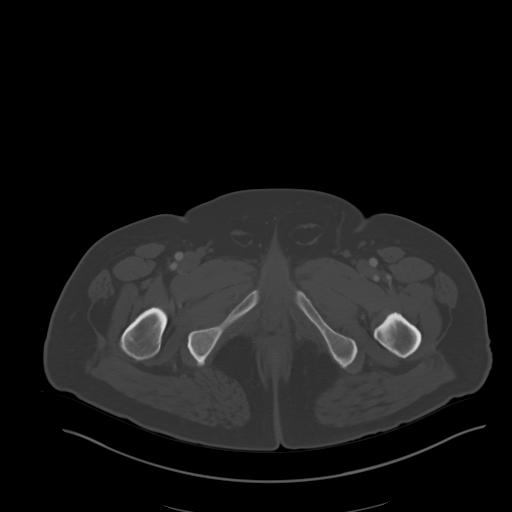
[im 24/134  soft-tissue]
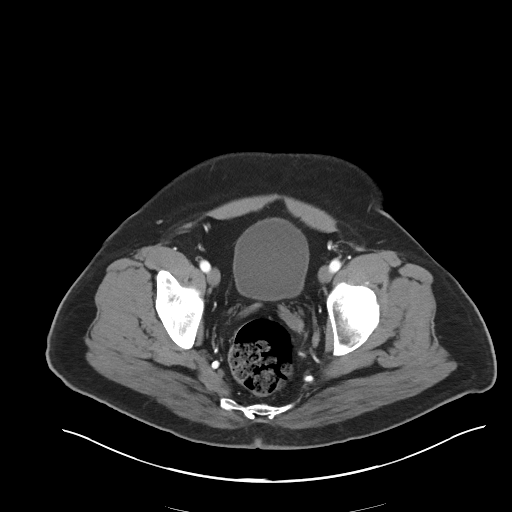
[im 32/134  soft-tissue]
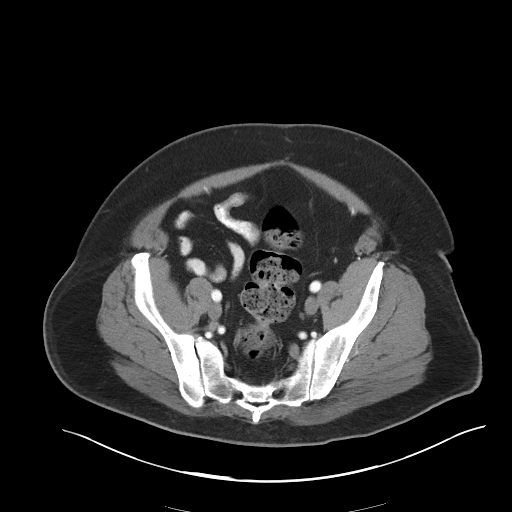
[im 40/134  soft-tissue]
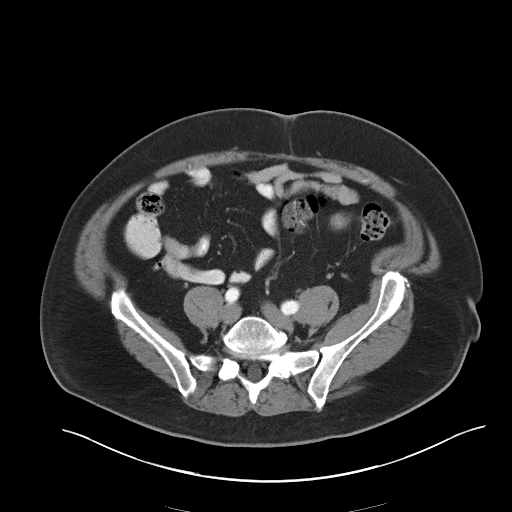
[im 55/134  soft-tissue]
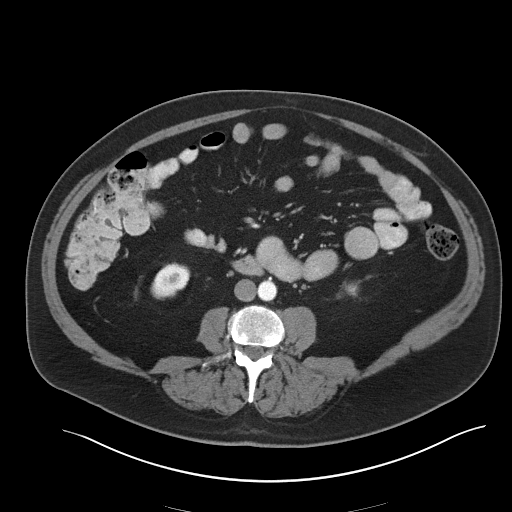
[im 63/134  soft-tissue]
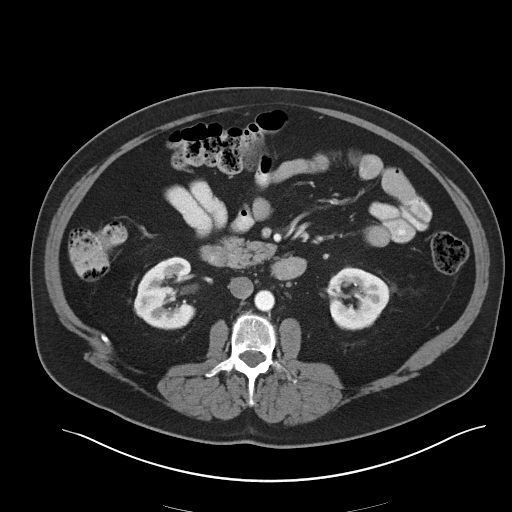
[im 71/134  soft-tissue]
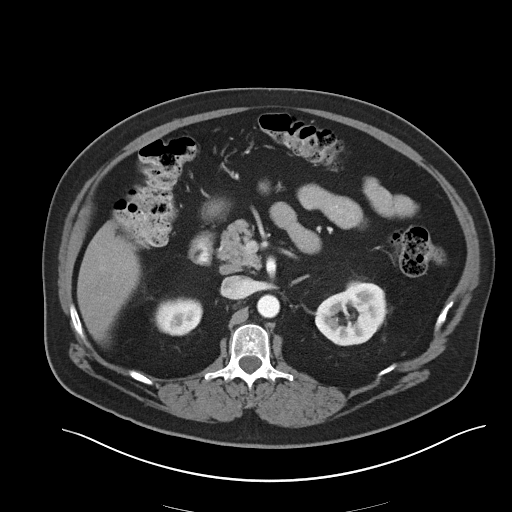
[im 87/134  soft-tissue]
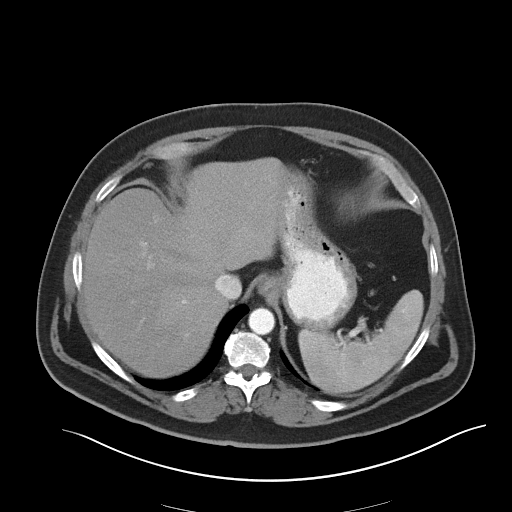
[im 94/134  soft-tissue]
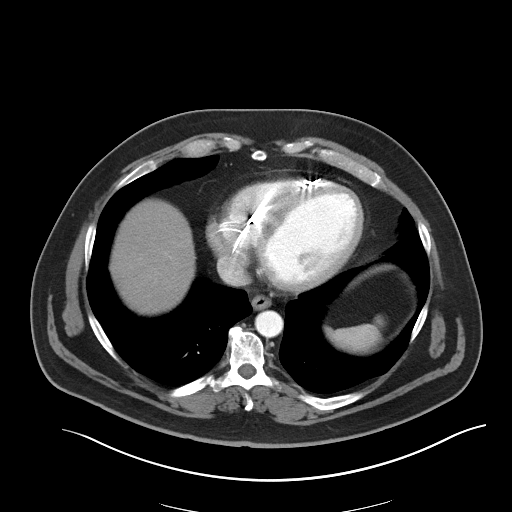
[im 94/134  bone]
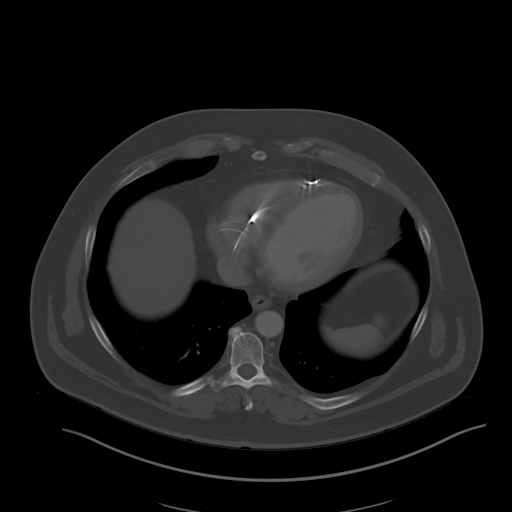
[im 102/134  soft-tissue]
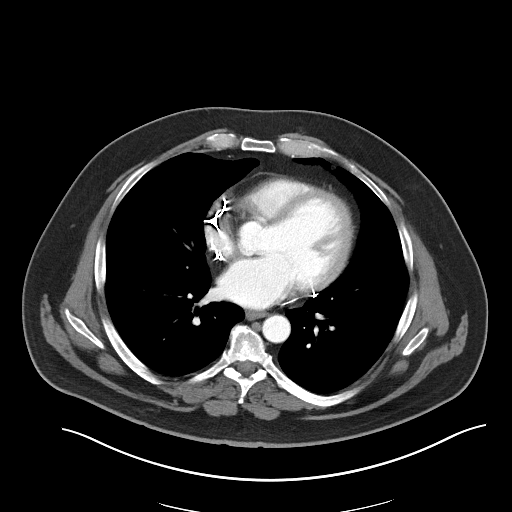
[im 118/134  soft-tissue]
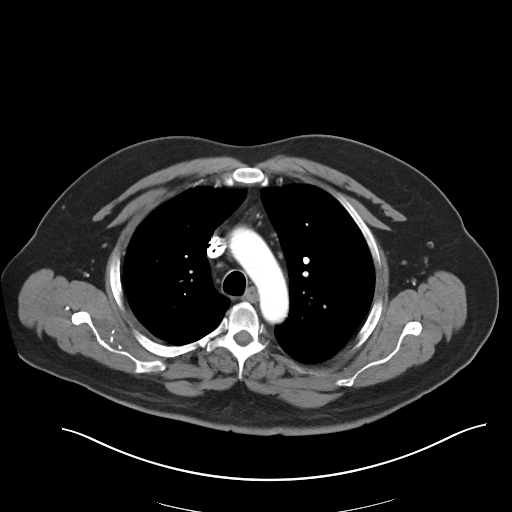
[im 126/134  soft-tissue]
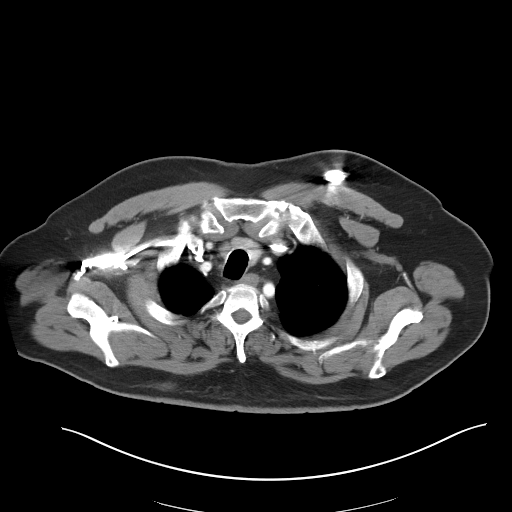

[Series 6: cor cap with · coronal · 0.90mm/px · 3 of 179 slices shown]
[im 60/179  soft-tissue]
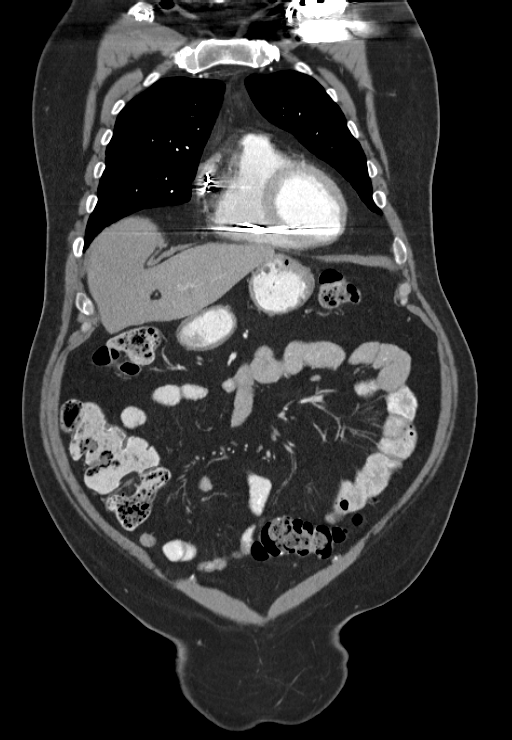
[im 80/179  soft-tissue]
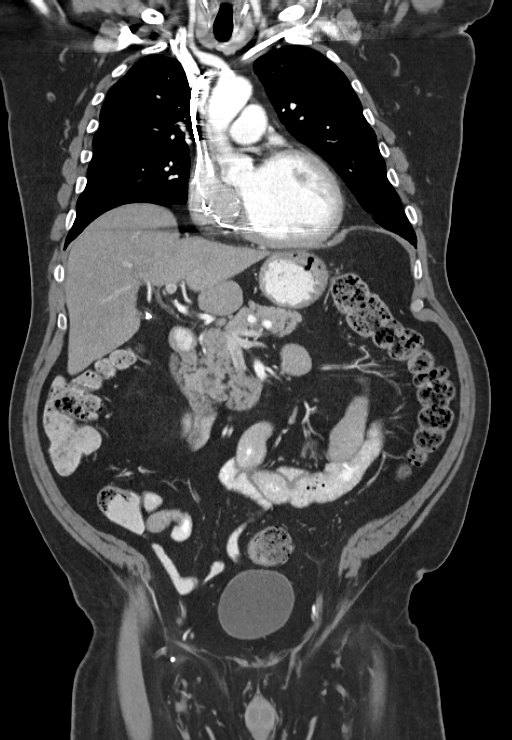
[im 99/179  soft-tissue]
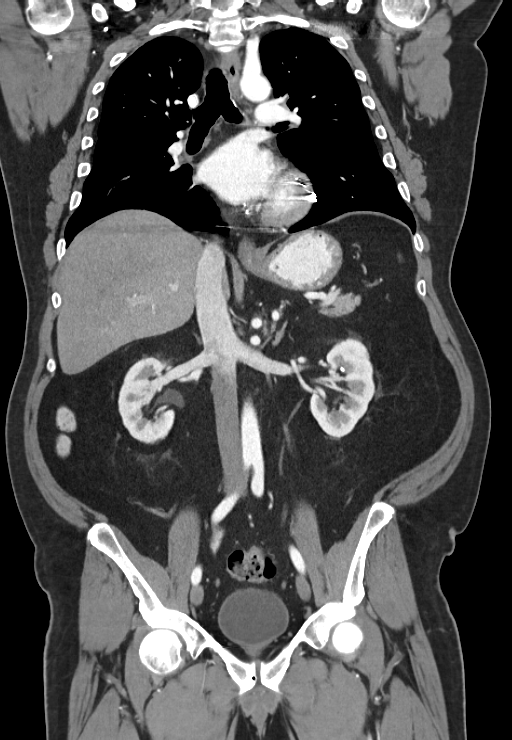

[15 of 46 positions shown; findings below may reference images not displayed]

FINDINGS: CT CHEST FINDINGS

Lungs/Pleura: No nodules or airspace opacities.

No pleural fluid.

Heart/Mediastinum: No supraclavicular adenopathy. Pacer/AICD device
with leads at right atrium and right ventricle. A right-sided
Port-A-Cath which terminates at the superior caval/atrial junction.

Mild cardiomegaly, without pericardial effusion. No central
pulmonary embolism, on this non-dedicated study. No mediastinal or
hilar adenopathy.

CT ABDOMEN AND PELVIS FINDINGS

Hepatobiliary: Mild hepatic steatosis. Prominent caudate lobe,
similar. Vague subcapsular hypoattenuation in the lateral segment
left liver lobe is similar, on the order of 9 mm. Example image 55.
Cholecystectomy, without biliary ductal dilatation.

Spleen: Small splenules.

Pancreas: Normal, without mass or pancreatic ductal dilatation.

Stomach/Bowel: Normal stomach, without wall thickening. Surgical
sutures in the sigmoid, without locally recurrent disease. Normal
terminal ileum and appendix. Normal small bowel without abdominal
ascites. No evidence of omental or peritoneal disease.

Urinary tract: Normal adrenal glands. 7 mm inter/lower pole left
renal lesion is unchanged in size. This remains complex, as
evidenced by increased density within. This has been similar in size
back to 06/27/2013.

Left renal cysts. Bilateral renal collecting system calculi. Normal
ureters bladder.

Vascular/Lymphatic: No aneurysm. No abdominopelvic adenopathy.

Reproductive: Normal prostate, without significant free pelvic
fluid.

Musculoskeletal: Right femoral bone island.

Other:  Tiny bilateral fat containing inguinal hernias.
IMPRESSION: CT CHEST IMPRESSION

No acute process or evidence of metastatic disease in the chest.

CT ABDOMEN AND PELVIS IMPRESSION

1. Surgical sutures in the sigmoid. No evidence of recurrent or
metastatic disease.
2. Similar vague hypoattenuation in the left lobe of the liver.
Favored to represent focal steatosis. Recommend attention on
follow-up.
3. Similar prominence of the caudate lobe of the liver. No specific
findings to suggest cirrhosis. Correlate with risk factors for liver
disease.
4. Bilateral nephrolithiasis.
5. Indeterminate 7 mm left renal lesion, unchanged. This could
represent a hemorrhagic cyst or a small renal neoplasm.

## 2014-12-24 ENCOUNTER — Ambulatory Visit: Payer: Self-pay | Admitting: Internal Medicine

## 2015-01-07 ENCOUNTER — Ambulatory Visit
Admit: 2015-01-07 | Disposition: A | Discharge status: Home / Self Care | Payer: Self-pay | Attending: Internal Medicine | Admitting: Internal Medicine

## 2015-01-07 LAB — COMPREHENSIVE METABOLIC PANEL
ANION GAP: 8 (ref 7–16)
Albumin: 4.3 g/dL
Alkaline Phosphatase: 64 U/L
BUN: 14 mg/dL
Bilirubin,Total: 1 mg/dL
CALCIUM: 9.2 mg/dL
CO2: 23 mmol/L
Chloride: 101 mmol/L
Creatinine: 0.99 mg/dL
Glucose: 404 mg/dL — ABNORMAL HIGH
Potassium: 4.3 mmol/L
SGOT(AST): 42 U/L — ABNORMAL HIGH
SGPT (ALT): 51 U/L
Sodium: 132 mmol/L — ABNORMAL LOW
Total Protein: 7.7 g/dL

## 2015-01-07 LAB — CBC CANCER CENTER
BASOS ABS: 0.1 x10 3/mm (ref 0.0–0.1)
Basophil %: 1.1 %
EOS PCT: 4 %
Eosinophil #: 0.4 x10 3/mm (ref 0.0–0.7)
HCT: 42.5 % (ref 40.0–52.0)
HGB: 14.5 g/dL (ref 13.0–18.0)
LYMPHS ABS: 2.7 x10 3/mm (ref 1.0–3.6)
LYMPHS PCT: 28.7 %
MCH: 29.9 pg (ref 26.0–34.0)
MCHC: 34.1 g/dL (ref 32.0–36.0)
MCV: 88 fL (ref 80–100)
Monocyte #: 0.7 x10 3/mm (ref 0.2–1.0)
Monocyte %: 7.3 %
NEUTROS PCT: 58.9 %
Neutrophil #: 5.6 x10 3/mm (ref 1.4–6.5)
PLATELETS: 174 x10 3/mm (ref 150–440)
RBC: 4.85 10*6/uL (ref 4.40–5.90)
RDW: 13.1 % (ref 11.5–14.5)
WBC: 9.5 x10 3/mm (ref 3.8–10.6)

## 2015-01-07 LAB — CREATININE, SERUM: Creatine, Serum: 0.99 mg/dL

## 2015-01-08 ENCOUNTER — Encounter: Payer: Self-pay | Admitting: *Deleted

## 2015-01-08 LAB — CEA: CEA: 1.4 ng/mL (ref 0.0–4.7)

## 2015-01-25 NOTE — Op Note (Signed)
PATIENT NAME:  [name] [name], [name] [name] MR#:  [id number] DATE OF BIRTH:  1956/06/04  DATE OF PROCEDURE:  06/26/2013  PREOPERATIVE DIAGNOSIS: Colon cancer.   POSTOPERATIVE DIAGNOSIS: Colon cancer.   PROCEDURE PERFORMED: Insertion central venous catheter with subcutaneous infusion port.   SURGEON: Rochel Brome, M.D.   ANESTHESIA: Local, monitored anesthesia care.   INDICATIONS: This 59 year old male had recent surgery for colon cancer, now needing central venous access for chemotherapy.   DESCRIPTION OF PROCEDURE: The patient was placed on the operating table in the supine position and sedated. A rolled towel was placed behind the shoulder blades. The anterior aspect of the neck and the right subclavian areas were prepared with ChloraPrep and draped in a sterile manner. The jugular vein was identified with ultrasound. The skin beneath the clavicle was infiltrated with 1% Xylocaine and then a transversely oriented 3 cm incision was made and carried down through subcutaneous tissues. A subcutaneous pouch was created just anterior to the deep fascia, large enough to admit the [place] port. As noted, a number of small bleeding points were cauterized and after creating the pouch, attention was moved to the neck. The jugular vein was further identified with ultrasound and the jugular vein appeared normal. Carotid artery was identified. The skin overlying the jugular vein was infiltrated with 1% Xylocaine. A transversely oriented 6 mm incision was made over the jugular vein. A needle was inserted into the vein using ultrasound guidance. An ultrasound image was saved. The guidewire was advanced down through the needle and into the vena cava. The needle was withdrawn. The position of the guidewire was seen in the vena cava. Also identified pacemaker wires. This was seen with fluoroscopy.   Next, the dilator and introducer sheath were advanced over the guidewire. The guidewire and dilator were removed. There was some dark  blood, which came out through the sheath. Next, the catheter was passed down through the sheath and the sheath was peeled away. The tip of the catheter was positioned in the superior vena cava as seen with fluoroscopy and a fluoroscopic image was saved for the paper chart and this was placed at 14 cm from the skin incision. Next, the catheter was tunneled down to the subclavian incision. Pressure was held over the tunnel site. The catheter was cut to fit and attached to the [place] port using the accompanying sleeve. The port was accessed with a Huber needle, aspirated a trace of blood, and then flushed with 10 mL of saline. The port was placed into the subcutaneous pouch and sutured to the deep fascia with 4-0 silk. Hemostasis was intact. The pouch was closed with interrupted 4-0 Vicryl and both skin incisions were closed with 4-0 Vicryl subcuticular suture and Dermabond. The patient tolerated the procedure satisfactorily and was then prepared for transfer to the recovery room.  ____________________________ Lenna Sciara. Rochel Brome, MD jws:aw D: 06/26/2013 14:37:02 ET T: 06/26/2013 14:54:15 ET JOB#: [id number]  cc: Loreli Dollar, MD, <Dictator> Loreli Dollar MD ELECTRONICALLY SIGNED 06/27/2013 9:01

## 2015-01-25 NOTE — Discharge Summary (Signed)
PATIENT NAME:  [name] [name], [name] [name] MR#:  [id number] DATE OF BIRTH:  19-Apr-1956  DATE OF ADMISSION:  05/25/2013 DATE OF DISCHARGE:  05/28/2013  HISTORY OF PRESENT ILLNESS: This 59 year old patient came in the hospital for elective sigmoid resection. He had recently had screening colonoscopy done by Dr. Vira Agar with findings of a sessile tubular adenoma of the sigmoid colon, 30 cm from the dentate line. 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 A tattoo was placed just distal to the lesion.   PAST MEDICAL HISTORY: 1.  Cardiomyopathy.  2.  Hypertension.  3.  Hyperlipidemia.  4.  Non-insulin-dependent diabetes mellitus.  6.  Obesity.   PAST SURGICAL HISTORY: Pacemaker and defibrillator implanted May 2010.   Other details of past medical history and physical findings are recorded on the typed [place] [facility] [place]: The patient did have preoperative bowel preparation. Preoperative prophylactic antibiotic and had a lower abdominal midline incision with sigmoid colectomy.   Postoperatively, he was treated with IV fluids and subcutaneous prophylactic heparin. He was also treated with below-knee support stockings. Was begun initially on a clear liquid diet, later advanced to a full liquid diet, which he tolerated well, had satisfactory bowel movements and was in satisfactory condition for discharge on the third postoperative day.   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 Tumor size was 1 cm in greatest dimension. Tumor invaded the submucosa. There was metastatic disease in 1 out of 14 regional mesenteric lymph nodes. Margins were clear.   DIAGNOSIS:  Carcinoma of the sigmoid colon.   OPERATION: Sigmoid colectomy.   Instructions were given for further postoperative care, diet, wound care and activity level. Plans made for follow-up and also anticipate oncology consultation.     ____________________________ Lenna Sciara. Rochel Brome, MD jws:cc D: 06/12/2013 20:12:51 ET T: 06/12/2013 21:08:12 ET JOB#: [id number]  cc: Loreli Dollar, MD, <Dictator> Loreli Dollar MD ELECTRONICALLY SIGNED 06/13/2013 18:00

## 2015-01-25 NOTE — H&P (Signed)
PATIENT NAME:  [name] [name], [name] [name] MR#:  [id number] DATE OF BIRTH:  [birth year]  DATE OF ADMISSION:  03/30/2013  PRIMARY CARE PHYSICIAN:  Irven Easterly. Kary Kos, MD  PRIMARY CARDIOLOGIST:  Javier Docker. Ubaldo Glassing, MD  PRIMARY ENDOCRINOLOGIST: Sherlon Handing, MD  CHIEF COMPLAINT:   Hypotension and uncontrolled diabetes.   HISTORY OF PRESENT ILLNESS:  A 59 year old male patient with a history of diabetes mellitus, hypertension, chronic systolic CHF with ejection fraction of 25% presents to the Emergency Room sent in from Dr. Joycie Peek office after a blood pressure could not be recorded in the office. While in the Emergency Room, the patient's blood pressure could not be recorded. He had fluid boluses after which his blood pressure is presently at 92/60. The patient also had an elevated white count and is being admitted for observation for possible sepsis. The patient has been asymptomatic all through these symptoms except polyuria. Over the past few weeks, his blood sugars have been elevated in the 200 to 300 range and it was 500 in Dr. Joycie Peek office. He had 20 units of NovoLog given and also 10 units of NovoLog given at 5:00 p.m. later on. Presently, blood sugar is 250.   The patient has had polyuria and also continued to take his Lasix. No shortness of breath, chest pain, lightheadedness. He feels well.  He does not remember what changes were made to his medications in Dr. Joycie Peek office after he saw her.   PAST MEDICAL HISTORY:   1.  Diabetes mellitus.  2.  Hypertension.  3.  Chronic congestive heart failure with ejection fraction of 25%.  4.  AICD placement.  5.  Hyperlipidemia.   PAST SURGICAL HISTORY:  Left shoulder operation and AICD placed.   HOME MEDICATIONS:   1.  Benicar/hydrochlorothiazide 20/12.5 mg 1 tablet oral daily.  2.  Zocor 40 mg daily.  3.  Metformin 500 mg twice daily.  4.  Glyburide 8 mg oral twice a day.   ALLERGIES:  No known drug allergies.   SOCIAL HISTORY:  The patient does not  smoke. He used to drink alcohol heavily, but quit 8 years back. No drug use. Lives at home with his wife.   FAMILY HISTORY:  Mother is 75 with colon cancer. Father is 51 with congestive heart failure. Sister has sleep apnea.   REVIEW OF SYSTEMS:  CONSTITUTIONAL: No fever, fatigue, weakness, weight loss, weight gain.  EYES: No blurred vision, pain or redness.  ENT: No tinnitus, ear pain, hearing loss.  RESPIRATORY: No cough, wheeze, hemoptysis.  CARDIOVASCULAR: No chest pain, orthopnea, edema,  GASTROINTESTINAL: No nausea, vomiting, diarrhea or abdominal pain.  GENITOURINARY: No dysuria, hematuria. Has frequency.  ENDOCRINE: Has polyuria. No thyroid problems.  HEMATOLOGIC/LYMPHATICS: No anemia, easy bruising, bleeding.  INTEGUMENTARY: No acne, rash, lesions.  MUSCULOSKELETAL: No back pain, arthritis.  NEUROLOGIC: No focal numbness, weakness, dysarthria, seizures.  PSYCHIATRIC: No anxiety, depression.   PHYSICAL EXAMINATION: VITAL SIGNS: Temperature 97.9, pulse of 77, blood pressure 92/60 (this initially could not be recorded in the ER) and saturating 96% on room air.  GENERAL: Morbidly obese Caucasian male patient lying in bed, comfortable, conversational, cooperative with exam.  PSYCHIATRIC: Alert and oriented x 3. Mood and affect appropriate. Judgment intact.  HEENT: Atraumatic, normocephalic. Oral mucosa moist and pink. No oral ulcers or thrush. External ears and nose normal. No pallor. No icterus. Pupils bilaterally equal and reactive to light.  NECK: Supple. No thyromegaly. No palpable lymph nodes. Trachea midline. No carotid bruit, JVD.  CARDIOVASCULAR:  S1, S2, without any murmurs. Peripheral pulses 2+. No edema.  RESPIRATORY: Normal work of breathing. Clear to auscultation on both sides. No crackles.  GASTROINTESTINAL: Soft abdomen, nontender. Bowel sounds present. No hepatosplenomegaly palpable.  SKIN: Warm and dry. No petechiae, rash, ulcers.  GENITOURINARY: No CVA tenderness or  bladder distention.  MUSCULOSKELETAL: No joint swelling, redness, effusion of the large joints. Normal muscle tone.  NEUROLOGICAL: Motor strength 5 out of 5 in upper and lower extremities. Sensation to fine touch intact all over. Cranial nerves II through XII are intact.  LYMPHATIC: No cervical lymphadenopathy.   LABORATORY DATA:  Glucose was 421 initially and presently 258, BUN 13, creatinine 1.41, sodium 134, potassium 4.1. GFR is 55. Calcium is 10.4. Ketones negative. Troponin is less than 0.02. WBC is 14.8, hemoglobin 16, platelets 246. Urinalysis shows no bacteria.   DIAGNOSTIC DATA:  EKG shows a paced rhythm. Chest x-ray shows no acute abnormalities.   ASSESSMENT AND PLAN:   1.  Hypotension in a patient with polyuria on Lasix likely secondary from dehydration, but with elevated white count cannot rule out sepsis. Although there is no source of infection, we will wait for blood cultures. Start patient empirically on Levaquin; already a dose has been given in the Emergency Room. We will hold his Lasix and continue intravenous fluids. We will have to watch out for any fluid overload with his low ejection fraction of 25%. Hold blood pressure medications.  2.  Uncontrolled diabetes mellitus. Continue the metformin and glyburide the patient is on. We will start him on Lantus15 units daily considering his elevated blood sugars. He will follow up with Dr. Gabriel Carina as an outpatient. Check HbA1c.  3.  Hypertension presently hypotensive. Hold medications.  4.  Acute renal failure/chronic kidney disease. This could also be progressive chronic kidney disease. His baseline creatinine seemed to be 1.2, but this is on old lab study. We will start him on intravenous fluids and repeat labs in the morning.  5.  Chronic systolic congestive heart failure with an ejection fraction 25%, stable Now that pt is on fluids. Watch out for any fluid overload.  6.  Hyponatremia. Corrected sodium of greater than 135.  7.  Deep  venous thrombosis prophylaxis with heparin.   CODE STATUS:  Full code.   TIME SPENT TODAY ON THIS CASE:  Was 9 minutes.   ____________________________ Leia Alf Omah Dewalt, MD srs:si D: 03/30/2013 21:39:00 ET T: 03/30/2013 22:26:12 ET JOB#: [id number]  cc: Alveta Heimlich R. Kaede Clendenen, MD, <Dictator> Irven Easterly. Kary Kos, MD Javier Docker Ubaldo Glassing, MD A. Lavone Orn, MD  Alveta Heimlich Arlice Colt MD ELECTRONICALLY SIGNED 03/31/2013 15:53

## 2015-01-25 NOTE — Discharge Summary (Signed)
PATIENT NAME:  [name], BERENSON MR#:  [id number] DATE OF BIRTH:  1955/12/15  DATE OF ADMISSION:  03/30/2013 DATE OF DISCHARGE:  03/31/2013  PRIMARY CARE PHYSICIAN:  Dr. Kary Kos   DISCHARGE DIAGNOSES: 1. Hypotension. 2. Acute renal failure.  3. Chronic kidney disease.  4. Leukocytosis, possibly due to reaction.  5. Hypertension.  6. Congestive heart failure, chronic systolic ejection fraction 25%.  7. Diabetes.   CONDITION: Stable.   CODE STATUS: Full code.   HOME MEDICATIONS: Glimepiride 4 mg p.o. 2 tablets b.i.d., metformin 500 mg p.o. b.i.d. Zocor 40 mg p.o. at bedtime, Coreg 3.125 mg p.o. daily.   DIET: Low sodium, low fat, low cholesterol, ADA diet.   ACTIVITY: As tolerated.   FOLLOW UP CARE: Follow with PCP within 1 week.   REASON FOR ADMISSION: Hypotension, uncontrolled diabetes.   HOSPITAL COURSE: The patient is a 59 year old gentleman with a history of hypertension, diabetes, congestive heart failure with ejection fraction 25%, who presented to ED due to hypotension and uncontrolled diabetes. The patient was sent from Dr. Joycie Peek office to his blood pressure was 92/60. Blood sugar was about 200 to 300 range but it was 500 in Dr. Joycie Peek office. For detailed history and physical examination, please refer to the admission note dictated by Dr. Darvin Neighbours.  On admission date, the patient's glucose was 421, BUN 13, creatinine 1.41. Troponin less than 0.02. WBC 14.8.  After his chest x-ray did not show any acute abnormality.  1. Hypotension. After admission, the patient's Lasix and other hypermedication was on hold. The patient was treated with gentle rehydration and blood pressure has been improving and the last blood pressure is 106/71.  2. Acute renal failure. The patient's renal function has improved to normal range after IV fluid support.  3. Uncontrolled diabetes.  After admission, the patient has been treated with sliding scale with Lantus 15 units daily, blood sugar is about 200.   4. Congestive heart failure with ejection fraction 25%.  Lasix was on hold due to hypotension. The patient's congestive heart failure has been stable. No sign of fluid overload. 5. Hyponatremia. The patient's sodium was a little bit low at 134. After IV fluid support, it increased to 138.  6. Leukocytosis. WBC decreased to 10.4 today.   The patient has no symptoms today, his vital signs are stable. The patient will be discharged to home today. However, will hold the patient's Benicar, hydrochlorothiazide and Lasix for now and we will resume low dose Coreg 3.125 mg p.o. daily. The patient needs to follow up with primary care physician for possible resuming other hypertension medication if the patient's blood pressure is stable.  Again, the patient's ACE inhibitor was on hold due to hypotension and acute renal failure. It may be resumed depending on patient's primary care physician decision.  I discussed the patient's discharge plan with the patient and case manager and nurse.   TIME SPENT: About 37 minutes.   ____________________________ Demetrios Loll, MD qc:rw D: 03/31/2013 17:14:03 ET T: 03/31/2013 19:08:04 ET JOB#: [id number]  cc: Demetrios Loll, MD, <Dictator> Demetrios Loll MD ELECTRONICALLY SIGNED 03/31/2013 23:43

## 2015-01-25 NOTE — Op Note (Signed)
PATIENT NAME:  [name] [name], [name] [name] MR#:  [id number] DATE OF BIRTH:  1956/04/25  DATE OF PROCEDURE:  05/25/2013  PREOPERATIVE DIAGNOSIS:  Tubulovillous adenoma of the sigmoid colon.   POSTOPERATIVE DIAGNOSIS:  Tubulovillous adenoma of the sigmoid colon.   PROCEDURE:  Sigmoid colectomy.   SURGEON:  Rochel Brome, MD.   ANESTHESIA:  General.   INDICATION: This 59 year old male recently had colonoscopy findings of a tubulovillous adenoma of the sigmoid colon and surgery was recommended for definitive treatment.   DESCRIPTION OF PROCEDURE:  The patient was placed on the operating table in the supine position under general anesthesia. The abdomen was clipped. The circulating nurse inserted a Foley urinary catheter with Betadine preparation of the penis draining a clear, yellow urine. The legs were placed into the lithotomy position using bumblebee stirrups. I did a digital anorectal exam, evacuating a small amount of liquid stool. The perineum was prepared with Betadine solution and the abdomen was prepared with ChloraPrep, and the abdomen was draped out in a sterile manner.  A lower abdominal midline incision was made from the umbilicus to the pubic symphysis, carried down through subcutaneous tissues. Numerous small bleeding points were cauterized. The midline fascia was incised. The peritoneal cavity was opened. The wound was carefully inspected and a number of small bleeding points cauterized until hemostasis was intact.  Initial inspection, there was no palpable mass within the liver. There was an approximately 4 cm mass within the mid aspect of the sigmoid colon which did not appear to be growing through the bowel wall. There was no grossly palpable adenopathy. The sigmoid colon was mobilized with incision of the peritoneal reflection and the descending colon was mobilized with a combination of scissor dissection and use of Harmonic scalpel.  Next, proximal distal sites for margin of resection were  selected so that a segment of colon approximately 8 inches in length was removed. The mesentery, proximally, was dissected with a Harmonic scalpel and also distally dissected with Harmonic scalpel to remove a V-shaped portion of mesentery. One small artery was suture ligated with 3-0 chromic.  Next, the bowel is divided proximally and distally with electrocautery and the ends of the bowel were held up with Allis clamps. The specimen was passed off the table and submitted in formalin for routine pathology and made note that the ink mark is distal to the tumor.  The anastomosis was subsequently carried out. The more proximal segment of bowel had a somewhat smaller caliber and made an incision on the antimesenteric border to lengthen the staple line proximally.  Next, anastomosis was carried out with triangulation technique with 3 applications of the LO-75 stapler beginning along the mesenteric wall and then completing with 2 more applications. The anastomosis was widely patent and intact. Hemostasis was intact.  Next, the mesenteric defect was closed with running 3-0 chromic. Hemostasis was intact. Gloves, gown, instruments, towels, Bovie and suction were changed.  Next, the omentum was brought beneath the wound. The midline fascia was closed with interrupted 0 Maxon figure-of-eight sutures. The wound was further inspected. Several tiny bleeding points were cauterized. Hemostasis was subsequently intact. The skin was closed with clips. Dressings were applied with paper tape. The patient tolerated surgery satisfactorily and was then prepared for transfer to the recovery room.   ____________________________ Lenna Sciara. Rochel Brome, MD jws:ce D: 05/25/2013 10:28:26 ET T: 05/25/2013 11:26:26 ET JOB#: [id number]  cc: Loreli Dollar, MD, <Dictator> Loreli Dollar MD ELECTRONICALLY SIGNED 05/26/2013 20:58

## 2015-01-26 NOTE — H&P (Signed)
PATIENT NAME:  [name] [name], WESTRY MR#:  [id number] DATE OF BIRTH:  02-16-1956  DATE OF ADMISSION:  01/22/2014  HISTORY OF PRESENT ILLNESS: This 59 year old male was admitted emergently with acute onset of severe diffuse epigastric pain. This came on yesterday and lasted all day. He was anorexic, had no nausea or vomiting, but very little oral intake. He reported no chills or fever. He has no history of jaundice or hepatitis. He was evaluated by the Emergency Room staff and did have a CT scan which demonstrated gallstone and some evidence of a trace of pericholecystic fluid. White blood count was elevated. He was admitted emergently   PAST MEDICAL HISTORY:  1. He did have pneumonia several years ago.  2. He has a history of idiopathic cardiomyopathy diagnosed in 2010. He did have cardiac catheterization with insignificant coronary artery disease. He had a component of congestive heart failure at that time with ankle edema. Echocardiogram in April 2014 with ejection fraction 30% to 35%. He has no history of chest pain. He does have history of sick sinus syndrome and history of PVCs. He does have a pacemaker/defibrillator in the left subclavian area.  3. Hypertension.  4. Hyperlipidemia.  5. Noninsulin-dependent diabetes mellitus.  6. Obesity.  7. History of colon cancer. He had sigmoid colectomy in August 2014. He did have the cancer arising in a large tubulovillous adenoma with high-grade dysplasia. One out of 14 lymph nodes were positive. He did have chemotherapy, which he tolerated satisfactorily.   PAST SURGICAL HISTORY: Includes:  1. The above-mentioned sigmoid colectomy.  2. Pacemaker/defibrillator, May 2010. 3. Has had bladder outlet dilatation in 1995.  4. Has had rotator cuff surgery.   MEDICATIONS: Include:  1. Benicar 12.5/20 half tablet daily.  2. Carvedilol 3.125 mg 1 tablet daily.  3. Glimepiride 1 mg 2 tablets 2 times per day.  4. Januvia 100 mg 1 tablet daily. 5. Lantus SoloSTAR 22  units subcutaneous at bedtime.  6. Metformin 500 mg 2 tablets 2 times per day.  7. He has been on a research cancer medication 1 tablet daily.  8. Simvastatin 40 mg daily.   DRUG ALLERGIES: None known.   SOCIAL HISTORY: He is married. He does not smoke. Does not drink any alcohol. He is retired.   FAMILY HISTORY: Positive for colon cancer in his mother. Family history also positive for breast cancer, brain cancer and diabetes.   REVIEW OF SYSTEMS: He reports no other recent acute illness such as cough, cold, sore throat. No chest pain. No difficulties breathing. He has been moving his bowels satisfactorily, voiding satisfactorily. Having no recent ankle edema. He reports no recent sores or boils. No chills or fever. No history of jaundice. Review of systems otherwise negative.   PHYSICAL EXAMINATION:  GENERAL: He is in the hospital bed in no acute distress. Awake, alert and oriented.  VITAL SIGNS: Temperature 98, pulse 82, respirations 20, blood pressure 108/72, oxygen saturation 96% on room air.  SKIN: Warm and dry, without rash or jaundice.  HEENT: Pupils equally reactive to light. Extraocular movements are intact. Sclerae are clear. Pharynx clear.  NECK: With no palpable mass. Does have pacemaker in the left subclavian area.  HEART: Regular rhythm, S1 and S2, without murmur.  LUNGS: Sounds are clear. No respiratory distress.  ABDOMEN: Obese, soft and nontender, with no palpable mass. No hepatomegaly.  EXTREMITIES: With no pedal edema.  NEUROLOGIC: Awake, alert and oriented, moving all extremities.   LABORATORY DATA: Reviewed. His glucose on admission was  325, this morning is 194. His creatinine 0.79. Liver panel this morning is normal. White blood count in the Emergency Room was 19,900 and this morning is 12,600, hemoglobin 13.0.   IMAGING: CT scan demonstrated a 1.5 cm gallstone in the gallbladder with mild pericholecystic inflammation, suspicious for acute cholecystitis. There was no  other abdominal mass identified.   IMPRESSION: Cholecystitis, cholelithiasis.   PLAN: I recommended laparoscopic cholecystectomy. I discussed the operation, care, risks and benefits with him. We are going to get that set up to do later today. Keep him n.p.o. for surgery.    ____________________________ Lenna Sciara. Rochel Brome, MD jws:lb D: 01/23/2014 09:14:13 ET T: 01/23/2014 09:30:47 ET JOB#: [id number]  cc: Loreli Dollar, MD, <Dictator> Irven Easterly. Kary Kos, MD Loreli Dollar MD ELECTRONICALLY SIGNED 01/23/2014 16:08

## 2015-01-26 NOTE — H&P (Signed)
PATIENT NAME:  [name] [name], [name] [name] MR#:  [id number] DATE OF BIRTH:  1956-08-16  DATE OF ADMISSION:  01/22/2014  PRIMARY CARE PHYSICIAN: Dr. Jarome Lamas.   REFERRING PHYSICIAN: Dr. Graciella Freer.   CHIEF COMPLAINT: Abdominal pain.   HISTORY OF PRESENT ILLNESS:  The patient is a 59 year old male with a history of multiple medical problems including diabetes mellitus; hypertension; cardiomyopathy, ischemic; status post defibrillator placement, presented to the Emergency Department with complaints of abdominal pain started since last night. The patient states could not sleep all night long. Fell asleep this morning woke up around 12. Continued to have the abdominal pain associated with nausea. Did not have any episodes of vomiting. Did not have any diarrhea.   WORKUP IN THE EMERGENCY DEPARTMENT:  Patient is found to have acute cholecystitis with a gallstone of 1.5 cm in the neck of the gallbladder with a pericholecystic inflammation. The patient requested for surgery by Dr. Rochel Brome who will see the patient in the morning. Considering the patient's multiple medical problems, the patient is admitted to medicine service. Denies having any fever. At baseline, patient walks 3-4 blocks without having any shortness of breath or chest pain.   PAST MEDICAL HISTORY:  1. Colon cancer status post partial colectomy.  2. Cardiomyopathy, ischemic.  3. Hypertension.  4. Diabetes mellitus, insulin-dependent.  5. Pacemaker defibrillator placement.  6. Open sigmoid colectomy.   ALLERGIES:  No known drug allergies.   MEDICATIONS:  1. Simvastatin 40 mg once a day.  2. Research cancer medication 1 capsule once a day.  3. Metformin 500 mg 2 tablets 2 times a day.  4. Lantus 22 units once at bedtime.  5. Januvia 100 mg once a day.  6. Glimepiride 2 tablets 2 times a day 1 mg. 7. Coreg 3.125 mg b.i.d.  8. Benicar hydrochlorothiazide half tablet once a day.   SOCIAL HISTORY: No history of smoking, drinking  alcohol, or using illicit drugs.  Married, lives with his wife.   FAMILY HISTORY: History of heart problems.   REVIEW OF SYSTEMS:  CONSTITUTIONAL: Generalized weakness.  EYES: There is no change in vision.  ENT: No change in hearing.  RESPIRATORY: No cough, shortness of breath.  CARDIOVASCULAR: No chest pain, palpations.  GASTROINTESTINAL: Has nausea, vomiting, abdominal pain.  GENITOURINARY: No dysuria or hematuria.  ENDOCRINE: Has diagnosis of diabetes mellitus. HEMATOLOGY: No easy bruising or bleeding.  SKIN: No rash or lesions.  MUSCULOSKELETAL: No joint pains and aches.  NEUROLOGIC:  The patient denies any numbness or weakness in any part of the body.   PHYSICAL EXAMINATION:  GENERAL: This is a well-built, well-nourished, age-appropriate male lying down in the bed, not in distress. VITAL SIGNS: Temperature 98.7, pulse 86, blood pressure 95/65, respiratory rate of 16, oxygen saturation 99% on room air.  HEENT: Head normocephalic, atraumatic. Eyes: No scleral icterus. Conjunctivae normal. Pupils equal and react to light. Extraocular movements are intact. Mucous membranes moist. No pharyngeal erythema. NECK: Supple. No lymphadenopathy, No JVD, no thyromegaly.  CHEST: Has no focal tenderness.  LUNGS:  Bilaterally clear to auscultation.   HEART: S1, S2, regular. No murmurs are heard.  ABDOMEN: Bowel sounds somewhat decreased, mildly distended. Could not appreciate any hepatosplenomegaly.  EXTREMITIES: No pedal edema. Pulses 2+.  SKIN: No rash or lesions.  MUSCULOSKELETAL: Good range of motion in all extremities.  NEUROLOGICAL:  Patient is alert, oriented to place, person and time. Cranial nerves II-XII intact. Motor 5/5 in upper and lower extremities.   LABORATORY DATA: Complete metabolic  panel: Sodium 131, potassium 5.1, bilirubin 1.1.    within normal limits. Lipase 270. CT abdomen and pelvis shows 1.5 cm stone in the neck of the gallbladder with the pericholecystic fluid. CBC: WBC  of 20,000, hemoglobin 15.3, platelet count of 214,000.   UA negative for nitrites and leukocyte esterase.   ASSESSMENT AND PLAN: The patient is a 59 year old male who comes with abdominal pain and is found to have acute cholecystitis.  1. Acute cholecystitis. Keep the patient on Zosyn. Keep the patient on surgery consult for Dr. Rochel Brome who will see the patient in the morning.  2. Concerning the patient's functional capability of able to walk 2-3 blocks without having chest pain, patient is cleared for the surgery. Continue with beta blockers perioperatively.  3. Hypotension. Low normal blood pressure.  Will hold all blood pressure medications.  4. Diabetes mellitus. Hold all oral medications. Continue with Lantus.  5. Hypertension.  As mentioned above, hold all blood pressure medications.  6. Keep the patient on deep vein thrombosis prophylaxis with Lovenox.   TIME SPENT: 50 minutes.  ____________________________ Monica Becton, MD pv:dd D: 01/22/2014 22:30:00 ET T: 01/22/2014 22:48:39 ET JOB#: [id number]  cc: Irven Easterly. Kary Kos, MD Monica Becton, MD, <Dictator>  Grier Mitts Newman Waren MD ELECTRONICALLY SIGNED 02/01/2014 20:58

## 2015-01-26 NOTE — Op Note (Signed)
PATIENT NAME:  [name], [name] MR#:  [id number] DATE OF BIRTH:  [birth year]  DATE OF PROCEDURE:  04/27/2014  TITLE OF THE PROCEDURE: Implantation of a new biventricular implantable cardiac defibrillator generator.   INDICATION:  1. Chronic systolic heart failure.  2. Left bundle branch block.  3. Biventricular implantable cardiac defibrillator generator at elective replacement indicator.   DETAILS OF PROCEDURE: Written informed consent was obtained and placed in the patient's permanent medical record after the risks and benefits were explained to the patient. The patient was taken to the Operating Room in a fasting, non-sedated state. The appropriate resuscitative equipment was attached to the patient. The patient was prepped and draped in a sterile manner. The area of the left infraclavicular fossa was scrubbed with Hibiclens. Using 1% Xylocaine, local anesthetic was administered to the area of the left infraclavicular fossa over the old incision site. Using a scalpel, an incision was made over the old incision site. Using a combination of electrocautery and blunt dissection, the implantable cardiac defibrillator and the leads were dissected free from the pocket. Adequate hemostasis was obtained and the implantable cardiac defibrillator was disconnected from the existing leads. Testing of the existing leads were as follows: P waves were 3.5. Atrial lead impedance was 494 ohms, threshold was 0.5 v at 0.5 ms. The RV lead sensed R wave 11.5, lead impedance was 342, and threshold was 0.7 v at 0.5 ms. The LV lead showed a lead impedance of 416, threshold of 1.7 v at 0.5 ms.   The new generator was attached to the leads and was placed in the pocket and the pocket was closed with running layers of 2-0 and 3-0 Vicryl and the subcuticular layer with 4-0 V-Loc.   Steri-Strips were placed over the incision site, as well as a sterile gauze and OpSite.   ESTIMATED BLOOD LOSS: 5 mL.  There were no complications  noted at the conclusion of the procedure and no fluoroscopy was utilized.   SUMMARY OF IMPLANTED HARDWARE: The patient has a Medtronic biventricular implantable cardiac defibrillator, model Viva XT CRT-DDTB. The serial number is [id number] implanted 04/27/2014.  The atrial leads are Medtronic 5076, model CapsureFix, serial number [id number] implanted 02/21/2009. The RV lead is a Medtronic [id number] Dual coil lead, serial number [id number] implanted 02/21/2009. The LV lead is a Medtronic [id number], model Attain Ability, serial number [id number] implanted 02/21/2009.   Impedance the RV defibrillation lead was 47. SVC was 56 ohms.   The device was programmed as follows: Bradycardia mode DDD with a lower rate limit of 50, upper tracking rate 130, upper sensory rate 120, adaptive CRT was programmed on Paced AV delay 150, sensed AV delay 100.  Tachycardia detection Skene's treatments were as follows. There was a 2 zone device, 1 active and 2 monitor.  The monitor zone is 171 beats per minute with 32 intervals for detection. The VF zone set at 200 beats per minute, 30 out of 40 as the  detection parameters. Shocks are programmed at maximum 35 joules x 4 with the first shock 35 joules with reverse polarity.     ____________________________ Priscille Heidelberg Marcello Moores, MD klt:ts D: 04/27/2014 13:49:42 ET T: 04/27/2014 14:52:45 ET JOB#: [id number]  cc: Lennette Bihari L. Marcello Moores, MD, <Dictator> Marzetta Board MD ELECTRONICALLY SIGNED 05/11/2014 9:21

## 2015-01-26 NOTE — Op Note (Signed)
PATIENT NAME:  [name] [name], [name] [name] MR#:  [id number] DATE OF BIRTH:  11-Jun-1956  DATE OF PROCEDURE:  01/23/2014  PREOPERATIVE DIAGNOSES: 1.  Cholecystitis. 2.  Cholelithiasis.   POSTOPERATIVE DIAGNOSES: 1.  Acute gangrenous cholecystitis. 2.  Cholelithiasis.   PROCEDURE: Laparoscopic cholecystectomy.   SURGEON: Rochel Brome, M.D.   ANESTHESIA: General.   INDICATIONS FOR PROCEDURE: This 59 year old male was admitted emergently with diffuse abdominal pain. He had CT findings of gallstone with evidence of pericholecystic fluid. He had an elevated white blood count, was started on antibiotics and surgery was recommended for definitive treatment.   DESCRIPTION OF PROCEDURE: The patient was placed on the operating table in the supine position under general anesthesia. The abdomen was clipped and prepared with ChloraPrep, draped in a sterile manner.  There was an old scar extending from the pubis to the umbilicus, and there was a long distance between the umbilicus and the right costal margin and I elected to make this incision just above the umbilicus which was made oriented longitudinally, some 12 mm in length and dissection was carried down to the deep fascia which was grasped with a laryngeal hook and elevated. A Veress needle went in very easily and was aspirated and irrigated with a saline solution. Next, the peritoneal cavity was inflated with carbon dioxide. The Veress needle was removed. The 10 mm cannula also went in very easily. It appeared that the fascia was quite thin. The peritoneal cavity was further insufflated with carbon dioxide. The liver appeared normal. There was a phlegmon surrounding the gallbladder. Brief survey of other intestines appeared typical. Another incision was made in the epigastrium slightly to the right of the midline to introduce an 11 mm cannula. Two incisions were made in the lateral aspect of the right upper quadrant to introduce two 5 mm cannulas.   The omentum was  dissected away from the gallbladder with blunt dissection. As it was peeled away, the gallbladder was found to have acute gangrenous changes. It was decompressed with a lancing needle, draining dark bile. Next, the gallbladder was retracted towards the right shoulder. The additional phlegmon was peeled away and the gallbladder neck appeared to be very fatty. The gallbladder neck was retracted inferiorly and laterally as the fat was incised and demonstrated the cystic duct, which was dissected free from surrounding structures. Also, the cystic artery was dissected free from the surrounding structures. Some additional fatty material was dissected away from these structures. A critical view of safety was demonstrated. An Endo Clip was placed across the cystic duct adjacent to the neck of the gallbladder. An incision was made in the cystic duct to introduce a Reddick catheter. The catheter would only thread in about 5 mm and would not thread any further; therefore, a cholangiogram was not done. The Reddick catheter was removed. The cystic duct was doubly ligated with Endo Clips and divided. The cystic artery was controlled with double Endo Clips and divided. The gallbladder was further dissected demonstrating the plane of dissection between the gallbladder and the liver. Numerous small bleeding points were cauterized. There was some bleeding which I held pressure for about a minute with the stone scoop and subsequently irrigated and aspirated and cauterized, and hemostasis was subsequently intact. The gallbladder was further dissected, was somewhat tedious, and eventually completely separated from the liver. The site was irrigated with heparinized saline solution and aspirated. Hemostasis appeared to be intact. Next, the gallbladder was placed into an Endo Catch bag and brought up into the supraumbilical  incision. It was necessary to lengthen the skin incision by about a centimeter and also lengthen the fascial incision  by about 1.4 cm and the gallbladder could be identified and was incised and suctioned some additional bile out and with additional traction and manipulation the gallbladder with its stone was removed and submitted in formalin for routine pathology. Pressure was held at this site as the right upper quadrant was further examined with some further insufflation of the peritoneal cavity and further demonstrated the operative site and appeared that hemostasis was intact. Next, the remaining cannulas were removed. Carbon dioxide was allowed to escape from the peritoneal cavity. The fascial defect at the supraumbilical incision was closed with interrupted 0 Maxon figure-of-eight sutures. Next, the epigastric 11 mm incision and 5 mm incisions were closed with 5-0 chromic subcuticular sutures. The supraumbilical incision was closed with interrupted 4-0 Monocryl subcuticular sutures. All incisions were further dressed with benzoin, Steri-Strips, gauze and paper tape.  The patient tolerated the procedure satisfactorily and was then prepared for transfer to the recovery room.    ____________________________ Lenna Sciara. Rochel Brome, MD jws:ce D: 01/23/2014 15:17:24 ET T: 01/23/2014 17:55:28 ET JOB#: [id number]  cc: Loreli Dollar, MD, <Dictator> Loreli Dollar MD ELECTRONICALLY SIGNED 01/26/2014 11:31

## 2015-04-06 ENCOUNTER — Other Ambulatory Visit: Payer: Self-pay

## 2015-04-06 DIAGNOSIS — C189 Malignant neoplasm of colon, unspecified: Secondary | ICD-10-CM

## 2015-04-10 ENCOUNTER — Encounter: Payer: Self-pay | Admitting: *Deleted

## 2015-04-10 ENCOUNTER — Inpatient Hospital Stay (HOSPITAL_BASED_OUTPATIENT_CLINIC_OR_DEPARTMENT_OTHER): Payer: Private Health Insurance - Indemnity | Admitting: Internal Medicine

## 2015-04-10 ENCOUNTER — Inpatient Hospital Stay: Payer: Private Health Insurance - Indemnity | Attending: Internal Medicine

## 2015-04-10 VITALS — BP 109/80 | HR 86 | Temp 98.3°F | Wt 233.5 lb

## 2015-04-10 DIAGNOSIS — Z79899 Other long term (current) drug therapy: Secondary | ICD-10-CM | POA: Diagnosis not present

## 2015-04-10 DIAGNOSIS — I10 Essential (primary) hypertension: Secondary | ICD-10-CM

## 2015-04-10 DIAGNOSIS — C189 Malignant neoplasm of colon, unspecified: Secondary | ICD-10-CM | POA: Insufficient documentation

## 2015-04-10 DIAGNOSIS — N289 Disorder of kidney and ureter, unspecified: Secondary | ICD-10-CM | POA: Insufficient documentation

## 2015-04-10 DIAGNOSIS — Z9221 Personal history of antineoplastic chemotherapy: Secondary | ICD-10-CM

## 2015-04-10 DIAGNOSIS — G629 Polyneuropathy, unspecified: Secondary | ICD-10-CM | POA: Diagnosis not present

## 2015-04-10 DIAGNOSIS — E119 Type 2 diabetes mellitus without complications: Secondary | ICD-10-CM | POA: Insufficient documentation

## 2015-04-10 DIAGNOSIS — Z85038 Personal history of other malignant neoplasm of large intestine: Secondary | ICD-10-CM

## 2015-04-10 LAB — COMPREHENSIVE METABOLIC PANEL
ALT: 44 U/L (ref 17–63)
AST: 40 U/L (ref 15–41)
Albumin: 4.2 g/dL (ref 3.5–5.0)
Alkaline Phosphatase: 46 U/L (ref 38–126)
Anion gap: 7 (ref 5–15)
BILIRUBIN TOTAL: 0.8 mg/dL (ref 0.3–1.2)
BUN: 13 mg/dL (ref 6–20)
CALCIUM: 9.1 mg/dL (ref 8.9–10.3)
CO2: 25 mmol/L (ref 22–32)
CREATININE: 0.92 mg/dL (ref 0.61–1.24)
Chloride: 100 mmol/L — ABNORMAL LOW (ref 101–111)
GFR calc non Af Amer: >60 mL/min (ref >60)
Glucose, Bld: 348 mg/dL — ABNORMAL HIGH (ref 65–99)
Potassium: 4.8 mmol/L (ref 3.5–5.1)
SODIUM: 132 mmol/L — AB (ref 135–145)
Total Protein: 7.7 g/dL (ref 6.5–8.1)

## 2015-04-10 LAB — CBC WITH DIFFERENTIAL/PLATELET
BASOS PCT: 1 %
Basophils Absolute: 0.1 10*3/uL (ref 0–0.1)
Eosinophils Absolute: 0.5 10*3/uL (ref 0–0.7)
Eosinophils Relative: 6 %
HCT: 40.8 % (ref 40.0–52.0)
Hemoglobin: 14 g/dL (ref 13.0–18.0)
Lymphocytes Relative: 27 %
Lymphs Abs: 2.5 10*3/uL (ref 1.0–3.6)
MCH: 30.5 pg (ref 26.0–34.0)
MCHC: 34.3 g/dL (ref 32.0–36.0)
MCV: 89 fL (ref 80.0–100.0)
MONOS PCT: 7 %
Monocytes Absolute: 0.7 10*3/uL (ref 0.2–1.0)
Neutro Abs: 5.6 10*3/uL (ref 1.4–6.5)
Neutrophils Relative %: 59 %
Platelets: 195 10*3/uL (ref 150–440)
RBC: 4.58 MIL/uL (ref 4.40–5.90)
RDW: 13.3 % (ref 11.5–14.5)
WBC: 9.4 10*3/uL (ref 3.8–10.6)

## 2015-04-10 NOTE — Progress Notes (Signed)
Patient has no complaints today.

## 2015-04-10 NOTE — Progress Notes (Signed)
[facility] [address] Protocol Research Encounter note:  Patient arrives in clinic today for 12 month follow up visit on Celecoxib 400 mg ( 1 tablet) daily.  He reports that he forgot his calendars this morning but he will bring them to the registration desk in the morning so I can reconcile his drug.  He has one opened bottle with 77 capsules remaining.  He states that he does not believe he has missed any doses but I don't have the completed calendars for review.  His review of systems was negative.  Specifically he denies any gi issues ie, nausea, vomiting, gastritis, constipation, diarrhea, or reflux. He denies any neuropathy.  His labs show a decreased sodium and chloride level  but these are not clinically significant.  He has an elevated glucose (Grade 3) but this is a non-fasting value, not clinically significant  and patient is historically  non-complaint with his recommended diet. His CEA is pending.   I issued a new bottle of Celecoxib with 100 capsules and 3 new monthly calendars.  He will return to clinic in 3 months for his next follow up visit.    Jake Samples, RN  Addendum: Pt.'s CEA- 1.3.  Pt. dropped off his calendars at the [facility] front desk.  I am still unable to reconcile his medications as the dates on his April calender are incorrect. Will follow-up.

## 2015-04-11 LAB — CEA: CEA: 1.3 ng/mL (ref 0.0–4.7)

## 2015-04-17 ENCOUNTER — Telehealth: Payer: Self-pay | Admitting: *Deleted

## 2015-04-17 NOTE — Telephone Encounter (Signed)
Pt having CT Scan on 06-26-15 at 9:30 at [facility] . Pt need to pick up prep kit NPO after midnight.   I change appt from 07-03-15 to 07-10-15 at 10:45...         T/C made to patient's wife [name] [name] to give her the above appointment information. The patient has a CT scan scheduled on 06/26/15 at 9:30am. Inst her that he will need to pick up a prep kit and that he should not eat or drink anything after midnight the night before. Also informed her that his follow up appt with Dr. Ma Hillock has been changed from 07/03/15  to 07/10/15 at 10:45am. [name] [name] states she is writing all of this down. Informed her that the information will also be mailed to them.

## 2015-04-21 NOTE — Progress Notes (Signed)
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 Tumor invades submucosa, margins uninvolved.  1/14 lymph nodes positive for tumor.  Patient completed 6 cycles of adjuvant chemotherapy with FOLFOX chemo on 07/12/13 - 09/22/13 (on clinical trial, CAL[facility] [id number]) and is on celecoxib/placebo.  HISTORY OF PRESENT ILLNESS:  Patient returns for continued oncology evaluation, he was seen few months ago. He is getting treatment on clinical trial. States he is doing well.. Denies any abdominal or pelvic pain. No diarrhea, constipation, bright red blood in stools, or melena. No new dyspnea, cough, chest pain, or hemoptysis. No fevers or chills. He has mild intermittent numbness in the tips of his fingers but primarily in the feet, feels that this is steadily improving. Denies any difficulty in use of hands, imbalance, or difficulty walking. Eating well. No new mood disturbances.   REVIEW OF SYSTEMS:   ROS As in HPI above. In addition, no fever, chills or sweats. No new headaches or focal weakness. No  sore throat, cough, shortness of breath, sputum, hemoptysis or chest pain. No abdominal pain, constipation, diarrhea, dysuria or hematuria. No new skin rash or bleeding symptoms. No new paresthesias in extremities. No polyuria polydipsia. PS ECOG 0.   PAST MEDICAL HISTORY: Reviewed. Past Medical History  Diagnosis Date  . Colon cancer   . Hypertension   . Diabetes mellitus without complication   . Cardiac arrhythmia 06/22/2013  . Pacemaker 06/22/2013  . Cardiomyopathy 06/22/2013          Diabetes mellitus  Hypertension  Hyperlipidemia  Obesity  History of pneumonia  several years ago  Idiopathic cardiomyopathy diagnosed 2010. ECHO April 2014 with EF of 30-35%.  History of sick sinus syndrome, history of PVCs, has pacemaker defibrillator in left subclavian area.  Sigmoid colectomy on 05/25/2013 for T1N1 colon cancer  Pacemaker defibrillator May 2010  Bladder stretched 1995  Rotator cuff surgery, left  Cholecystectomy April 2015  PAST SURGICAL HISTORY: Reviewed. Past Surgical History  Procedure Laterality Date  . Cholecystectomy    . Colectomy Left 05/25/2013    sigmoid colectomy  . Bladder surgery N/A 06/22/2013    "bladder stretched"  . Rotator cuff repair Left 08/2003    FAMILY HISTORY: Reviewed. Family History  Problem Relation Age of Onset  . Cancer Mother   . Diabetes Father   . Stroke Father   . Sleep apnea Sister   . Cancer Maternal Aunt     SOCIAL HISTORY: Reviewed. History  Substance Use Topics  . Smoking status: Never Smoker   . Smokeless tobacco: Not on file  . Alcohol Use: No    Allergies  Allergen Reactions  . No Known Allergies     Current Outpatient Prescriptions  Medication Sig Dispense Refill  . carvedilol (COREG) 3.125 MG tablet Take 3.125 mg by mouth 1 day or 1 dose.    Marland Kitchen glimepiride (AMARYL) 1 MG tablet Take 2 mg by mouth 2 (two) times daily. Take 2 tablets twice daily    . glimepiride (AMARYL) 4 MG tablet Take 4 mg by mouth daily with breakfast.    . glucose blood (ONE TOUCH ULTRA TEST) test strip Use 2 (two) times daily. Use as instructed.    . Insulin Glargine (  LANTUS) 100 UNIT/ML Solostar Pen Inject 22 Units into the skin 1 day or 1 dose. Once daily at bedtime    . metFORMIN (GLUCOPHAGE) 500 MG tablet Take 1,000 mg by mouth 2 (two) times daily with a meal.    . olmesartan-hydrochlorothiazide (BENICAR HCT) 20-12.5 MG per tablet Take 0.5 tablets by mouth daily.    . simvastatin (ZOCOR) 40 MG tablet Take 40 mg by mouth daily. Once daily at bedtime    . sitaGLIPtin (JANUVIA) 100 MG tablet Take 100 mg by  mouth 1 day or 1 dose. In the morning     No current facility-administered medications for this visit.    PHYSICAL EXAM: Filed Vitals:   04/10/15 1037  BP: 109/80  Pulse: 86  Temp: 98.3 F (36.8 C)     Body mass index is 33.5 kg/(m^2).    ECOG FS:0 - Asymptomatic  GENERAL: Patient is alert and oriented and in no acute distress. There is no icterus. HEENT: EOMs intact. Oral exam negative for thrush or lesions. No cervical lymphadenopathy. CVS: S1S2, regular LUNGS: Bilaterally clear to auscultation, no rhonchi. ABDOMEN: Soft, nontender. No hepatosplenomegaly clinically.  NEURO: grossly nonfocal, cranial nerves are intact. Gait unremarkable. EXTREMITIES: No pedal edema.   LAB RESULTS:    Component Value Date/Time   NA 132* 04/10/2015 1002   NA 132* 01/07/2015 1052   K 4.8 04/10/2015 1002   K 4.3 01/07/2015 1052   CL 100* 04/10/2015 1002   CL 101 01/07/2015 1052   CO2 25 04/10/2015 1002   CO2 23 01/07/2015 1052   GLUCOSE 348* 04/10/2015 1002   GLUCOSE 404* 01/07/2015 1052   BUN 13 04/10/2015 1002   BUN 14 01/07/2015 1052   CREATININE 0.92 04/10/2015 1002   CREATININE 0.99 01/07/2015 1052   CALCIUM 9.1 04/10/2015 1002   CALCIUM 9.2 01/07/2015 1052   PROT 7.7 04/10/2015 1002   PROT 7.7 01/07/2015 1052   ALBUMIN 4.2 04/10/2015 1002   ALBUMIN 4.3 01/07/2015 1052   AST 40 04/10/2015 1002   AST 42* 01/07/2015 1052   ALT 44 04/10/2015 1002   ALT 51 01/07/2015 1052   ALKPHOS 46 04/10/2015 1002   ALKPHOS 64 01/07/2015 1052   BILITOT 0.8 04/10/2015 1002   GFRNONAA >60 04/10/2015 1002   GFRNONAA >60 01/07/2015 1052   GFRAA >60 04/10/2015 1002   GFRAA >60 01/07/2015 1052   Lab Results  Component Value Date   WBC 9.4 04/10/2015   NEUTROABS 5.6 04/10/2015   HGB 14.0 04/10/2015   HCT 40.8 04/10/2015   MCV 89.0 04/10/2015   PLT 195 04/10/2015     STUDIES: 06/26/14. CT scan of chest/abdomen/pelvis with contrast. IMPRESSION:  No acute process or evidence of metastatic  disease in the chest. Surgical sutures in the sigmoid. No evidence of recurrent or metastatic disease. Similar vague hypoattenuation in the left lobe of the liver. Favored to represent focal steatosis. Recommend attention on follow-up. Similar prominence of the caudate lobe of the liver. No specific findings to suggest cirrhosis. Correlate with risk factors for liver disease. Bilateral nephrolithiasis. Indeterminate 7 mm left renal lesion, unchanged. This could represent a hemorrhagic cyst or a small renal neoplasm.  12/24/14 - CT scan of the chest/abdomen/pelvis. IMPRESSION: Postsurgical changes of partial colectomy, with unremarkable appearance of the surgical site of the sigmoid colon. Unchanged appearance of hypodense focus within segment 3 of the liver, relatively unchanged in size over the course of CT examinations dating to 06/27/2013. There is a second nonspecific hypodense focus within  the right liver lobe measuring 4 mm which was not visualized on the prior CT, though may not have been imaged secondary to volume averaging. Recommend attention to these 2 foci on follow-up imaging studies. Indeterminate left kidney lesion again visualized without interval growth. Differential includes complex cyst or small renal cell carcinoma less than 1 cm.  04/10/15 - CEA normal at 1.3.  01/23/15 - US kidneys.  IMPRESSION: No evidence of hydronephrosis. 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   ASSESSMENT / PLAN:   1. 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 Tumor invades submucosa, margins uninvolved. 1/14 lymph nodes positive for tumor. Patient is enrolled on CALGB clinical trial 80702. He has completed planned 6 cycles of FOLFOX chemotherapy, he continues on oral medication on CALGB clinical trial with celecoxib/placebo. CT scan done on 12/24/14  does not show any evidence of recurrent or metastatic malignancy, similar hypoattenuation in the left lobe of the liver favored to represent focal steatosis, and will continue to follow up on subsequent CT scans which will be done as per protocol guidelines. Serum CEA from today is normal at 1.3, labs reviewed and explained to patient. Plan is continued close surveillance as per protocol guidelines, he will continue on oral medication on CALGB trial. He will be scheduled for follow-up in about 12 weeks as per protocol guidelines with repeat labs including CBC, met-C, CEA.      2. Peripheral Neuropathy -  mild, grade 1, continues to gradually improve. No need for intervention at this time. Continue to monitor.  3. Diabetes mellitus - Blood sugar elevated at 348, patient states that he has been eating lot of watermelon and will stop doing this. States he will contact Dr.Solum if sugars still remain high (grade 1). 4. h/o Leukocytosis - Has resolved, WBC, normal today. Continue to monitor upon next visit here. 5. Indeterminate left kidney lesion again visualized on CT scan without interval growth. Differential includes complex cyst or small renal cell carcinoma less than 1 cm - per d/w urologist Dr.Brandon over the phone, they recommend continued monitoring and consider further workup if there is increase in size. In between visits, patient advised to call or come to ER in case of any new symptoms or acute sickness. He is agreeable to this plan.      Leia Alf, MD   04/21/2015 12:11 PM

## 2015-06-25 ENCOUNTER — Other Ambulatory Visit: Payer: Self-pay | Admitting: *Deleted

## 2015-06-25 DIAGNOSIS — C189 Malignant neoplasm of colon, unspecified: Secondary | ICD-10-CM

## 2015-06-26 ENCOUNTER — Ambulatory Visit
Admission: RE | Admit: 2015-06-26 | Discharge: 2015-06-26 | Disposition: A | Discharge status: Home / Self Care | Payer: Private Health Insurance - Indemnity | Source: Ambulatory Visit | Attending: Internal Medicine | Admitting: Internal Medicine

## 2015-06-26 ENCOUNTER — Ambulatory Visit: Admission: RE | Admit: 2015-06-26 | Payer: Private Health Insurance - Indemnity | Source: Ambulatory Visit

## 2015-06-26 DIAGNOSIS — C189 Malignant neoplasm of colon, unspecified: Secondary | ICD-10-CM | POA: Diagnosis present

## 2015-06-26 MED ORDER — IOHEXOL 350 MG/ML SOLN
100.0000 mL | Freq: Once | INTRAVENOUS | Status: AC | PRN
  Administered 2015-06-26: 100 mL via INTRAVENOUS

## 2015-07-03 ENCOUNTER — Other Ambulatory Visit: Payer: Private Health Insurance - Indemnity

## 2015-07-03 ENCOUNTER — Ambulatory Visit: Payer: Private Health Insurance - Indemnity | Admitting: Internal Medicine

## 2015-07-10 ENCOUNTER — Inpatient Hospital Stay: Payer: Private Health Insurance - Indemnity | Attending: Internal Medicine

## 2015-07-10 ENCOUNTER — Encounter: Payer: Self-pay | Admitting: *Deleted

## 2015-07-10 ENCOUNTER — Inpatient Hospital Stay (HOSPITAL_BASED_OUTPATIENT_CLINIC_OR_DEPARTMENT_OTHER): Payer: Private Health Insurance - Indemnity | Admitting: Internal Medicine

## 2015-07-10 DIAGNOSIS — Z006 Encounter for examination for normal comparison and control in clinical research program: Secondary | ICD-10-CM | POA: Diagnosis not present

## 2015-07-10 DIAGNOSIS — R200 Anesthesia of skin: Secondary | ICD-10-CM | POA: Diagnosis not present

## 2015-07-10 DIAGNOSIS — E785 Hyperlipidemia, unspecified: Secondary | ICD-10-CM | POA: Diagnosis not present

## 2015-07-10 DIAGNOSIS — Z794 Long term (current) use of insulin: Secondary | ICD-10-CM | POA: Insufficient documentation

## 2015-07-10 DIAGNOSIS — Z85038 Personal history of other malignant neoplasm of large intestine: Secondary | ICD-10-CM

## 2015-07-10 DIAGNOSIS — N289 Disorder of kidney and ureter, unspecified: Secondary | ICD-10-CM | POA: Insufficient documentation

## 2015-07-10 DIAGNOSIS — R202 Paresthesia of skin: Secondary | ICD-10-CM | POA: Insufficient documentation

## 2015-07-10 DIAGNOSIS — E119 Type 2 diabetes mellitus without complications: Secondary | ICD-10-CM | POA: Insufficient documentation

## 2015-07-10 DIAGNOSIS — Z950 Presence of cardiac pacemaker: Secondary | ICD-10-CM | POA: Diagnosis not present

## 2015-07-10 DIAGNOSIS — I10 Essential (primary) hypertension: Secondary | ICD-10-CM | POA: Insufficient documentation

## 2015-07-10 DIAGNOSIS — C189 Malignant neoplasm of colon, unspecified: Secondary | ICD-10-CM | POA: Insufficient documentation

## 2015-07-10 DIAGNOSIS — I429 Cardiomyopathy, unspecified: Secondary | ICD-10-CM | POA: Insufficient documentation

## 2015-07-10 DIAGNOSIS — Z79899 Other long term (current) drug therapy: Secondary | ICD-10-CM | POA: Insufficient documentation

## 2015-07-10 DIAGNOSIS — G629 Polyneuropathy, unspecified: Secondary | ICD-10-CM | POA: Insufficient documentation

## 2015-07-10 DIAGNOSIS — E669 Obesity, unspecified: Secondary | ICD-10-CM | POA: Diagnosis not present

## 2015-07-10 LAB — CBC WITH DIFFERENTIAL/PLATELET
BASOS ABS: 0.1 10*3/uL (ref 0–0.1)
BASOS PCT: 1 %
EOS ABS: 0.6 10*3/uL (ref 0–0.7)
Eosinophils Relative: 6 %
HEMATOCRIT: 45.9 % (ref 40.0–52.0)
HEMOGLOBIN: 15.5 g/dL (ref 13.0–18.0)
Lymphocytes Relative: 26 %
Lymphs Abs: 2.5 10*3/uL (ref 1.0–3.6)
MCH: 30.1 pg (ref 26.0–34.0)
MCHC: 33.9 g/dL (ref 32.0–36.0)
MCV: 89 fL (ref 80.0–100.0)
MONOS PCT: 8 %
Monocytes Absolute: 0.7 10*3/uL (ref 0.2–1.0)
NEUTROS ABS: 5.7 10*3/uL (ref 1.4–6.5)
NEUTROS PCT: 59 %
Platelets: 231 10*3/uL (ref 150–440)
RBC: 5.16 MIL/uL (ref 4.40–5.90)
RDW: 13.2 % (ref 11.5–14.5)
WBC: 9.7 10*3/uL (ref 3.8–10.6)

## 2015-07-10 LAB — COMPREHENSIVE METABOLIC PANEL
ALBUMIN: 4.7 g/dL (ref 3.5–5.0)
ALK PHOS: 50 U/L (ref 38–126)
ALT: 45 U/L (ref 17–63)
ANION GAP: 6 (ref 5–15)
AST: 40 U/L (ref 15–41)
BILIRUBIN TOTAL: 1.1 mg/dL (ref 0.3–1.2)
BUN: 18 mg/dL (ref 6–20)
CALCIUM: 9.2 mg/dL (ref 8.9–10.3)
CO2: 25 mmol/L (ref 22–32)
CREATININE: 1.06 mg/dL (ref 0.61–1.24)
Chloride: 104 mmol/L (ref 101–111)
GFR calc Af Amer: >60 mL/min (ref >60)
GFR calc non Af Amer: >60 mL/min (ref >60)
GLUCOSE: 211 mg/dL — AB (ref 65–99)
Potassium: 4.2 mmol/L (ref 3.5–5.1)
SODIUM: 135 mmol/L (ref 135–145)
TOTAL PROTEIN: 8.8 g/dL — AB (ref 6.5–8.1)

## 2015-07-10 NOTE — Progress Notes (Signed)
07/10/2015 @12 :15pm [name] returns to clinic today for consideration of next cycle of study drug. He has now completed 2 years of Celecoxib/placebo and has one more year to go on the study. Patient returned his bottle of Celecoxib/placebo and per pill count - he has 72 tabs left. He also returned his medication calendars, which show that [name] [name] was compliant with taking his medication except for 7 questionable days, which patient cannot explain. After looking at the calendars, it was noted that some begin on Sunday, and others begin on Wednesday. His calendars also only go through 06/22/15. Patient reports he has taken his medication daily since 06/22/15 - but ran out of calendars. Patient assisted to account for additional days that were not on calendar. He reports that his wife puts them in his pillbox and he takes them at the same time every morning after breakfast. Patient was dispensed a new bottle of 100 capsules of Celecoxib/placebo 400mg  and was given 4 new calendars to last until his next scheduled visit in January. He denies experiencing any new adverse events other than occasional neuropathy in fingertips, and some pain in right shoulder - both grade 1 and unrelated to study drug. Blood glucose levels elevated again today, however these are not fasting levels. He is to follow up with his PCP regarding his diabetes. Dr. Ma Hillock has reviewed CT scan with [name] [name]. Will schedule return appt on 10/08/15. [place]

## 2015-07-10 NOTE — Progress Notes (Signed)
Patient here for a follow up appointment.  Had CT scan 2 weeks ago and here for results.  No complaints of pain

## 2015-07-11 LAB — CEA: CEA: 0.8 ng/mL (ref 0.0–4.7)

## 2015-07-12 ENCOUNTER — Other Ambulatory Visit: Payer: Self-pay | Admitting: *Deleted

## 2015-07-12 DIAGNOSIS — M542 Cervicalgia: Secondary | ICD-10-CM

## 2015-07-12 DIAGNOSIS — C189 Malignant neoplasm of colon, unspecified: Secondary | ICD-10-CM

## 2015-07-12 DIAGNOSIS — R202 Paresthesia of skin: Principal | ICD-10-CM

## 2015-07-12 DIAGNOSIS — R200 Anesthesia of skin: Secondary | ICD-10-CM

## 2015-07-13 NOTE — Progress Notes (Addendum)
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 Tumor invades submucosa, margins uninvolved.  1/14 lymph nodes positive for tumor.  Patient completed 6 cycles of adjuvant chemotherapy with FOLFOX chemo on 07/12/13 - 09/22/13 (on clinical trial, CALGB 81017) and is on celecoxib/placebo.  HISTORY OF PRESENT ILLNESS:  Patient returns for continued oncology follow-up for history of colon cancer as described above, he is enrolled on clinical trial. He was seen few months ago. States he is doing well.. Denies any abdominal or pelvic pain. No new constipation, diarrhea, bright red blood in stools, or melena. No new dyspnea, cough, chest pain, or hemoptysis. No fevers or chills. He has mild intermittent numbness in the tips of his fingers but primarily in the feet, feels that this is continuing to gradually improve. Complains of intermittent radiating pain right upper extremity, intermittent numbness in the right forearm without muscle weakness, right shoulder area pain, mild neck discomfort. Denies any difficulty in use of hands, imbalance, or difficulty walking. Eating well. No new mood disturbances.   REVIEW OF SYSTEMS:   ROS  As in HPI above. In addition, no fevers or chills. No new headaches or focal weakness. No  sore throat, cough, shortness of breath, sputum, hemoptysis or chest pain. No abdominal pain, constipation, diarrhea, dysuria or hematuria. No new skin rash or bleeding symptoms. No new paresthesias in extremities. No polyuria polydipsia. Denies falls or loss of consciousness. PS ECOG 0.   PAST MEDICAL HISTORY:  Reviewed. Past Medical History  Diagnosis Date  . Colon cancer   . Hypertension   . Cardiac arrhythmia 06/22/2013  . Pacemaker 06/22/2013  . Cardiomyopathy 06/22/2013  . Diabetes mellitus without complication     Patient takes Metformin          Diabetes mellitus  Hypertension  Hyperlipidemia  Obesity  History of pneumonia several years ago  Idiopathic cardiomyopathy diagnosed 2010. ECHO April 2014 with EF of 30-35%.  History of sick sinus syndrome, history of PVCs, has pacemaker defibrillator in left subclavian area.  Sigmoid colectomy on 05/25/2013 for T1N1 colon cancer  Pacemaker defibrillator May 2010  Bladder stretched 1995  Rotator cuff surgery, left  Cholecystectomy April 2015  PAST SURGICAL HISTORY: Reviewed. Past Surgical History  Procedure Laterality Date  . Cholecystectomy    . Colectomy Left 05/25/2013    sigmoid colectomy  . Bladder surgery N/A 06/22/2013    "bladder stretched"  . Rotator cuff repair Left 08/2003    FAMILY HISTORY: Reviewed. Family History  Problem Relation Age of Onset  . Cancer Mother   . Diabetes Father   . Stroke Father   . Sleep apnea Sister   . Cancer Maternal Aunt     SOCIAL HISTORY: Reviewed. Social History  Substance Use Topics  . Smoking status: Never Smoker   . Smokeless tobacco: Not on file  . Alcohol Use: No    Allergies  Allergen Reactions  . No Known Allergies     Current Outpatient Prescriptions  Medication Sig Dispense Refill  . canagliflozin (INVOKANA) 300 MG TABS tablet Take 300 mg by mouth daily before breakfast.    . carvedilol (COREG) 3.125 MG tablet Take 3.125 mg by mouth  1 day or 1 dose.    Marland Kitchen glimepiride (AMARYL) 1 MG tablet Take 2 mg by mouth 2 (two) times daily. Take 2 tablets twice daily    . glimepiride (AMARYL) 4 MG tablet Take 4 mg by mouth daily with breakfast.    . glucose blood (ONE TOUCH ULTRA TEST) test strip Use 2 (two) times daily. Use as instructed.    . Insulin Glargine (LANTUS) 100  UNIT/ML Solostar Pen Inject 22 Units into the skin 1 day or 1 dose. Once daily at bedtime    . metFORMIN (GLUCOPHAGE) 500 MG tablet Take 1,000 mg by mouth 2 (two) times daily with a meal.    . olmesartan-hydrochlorothiazide (BENICAR HCT) 20-12.5 MG per tablet Take 0.5 tablets by mouth daily.    . simvastatin (ZOCOR) 40 MG tablet Take 40 mg by mouth daily. Once daily at bedtime    . sitaGLIPtin (JANUVIA) 100 MG tablet Take 100 mg by mouth 1 day or 1 dose. In the morning     No current facility-administered medications for this visit.    PHYSICAL EXAM: ECOG FS:0 - Asymptomatic GENERAL: Alert and oriented and in no acute distress. No icterus. HEENT: EOMs intact. Oral exam negative for thrush. No cervical lymphadenopathy. CVS: S1S2, regular LUNGS: Bilaterally clear to auscultation, no crepitations or rhonchi. ABDOMEN: Soft, nontender. No hepatomegaly clinically.  NEURO: grossly nonfocal, cranial nerves are intact. Gait unremarkable. EXTREMITIES: No pedal edema.  LAB RESULTS:    Component Value Date/Time   NA 135 07/10/2015 1110   NA 132* 01/07/2015 1052   K 4.2 07/10/2015 1110   K 4.3 01/07/2015 1052   CL 104 07/10/2015 1110   CL 101 01/07/2015 1052   CO2 25 07/10/2015 1110   CO2 23 01/07/2015 1052   GLUCOSE 211* 07/10/2015 1110   GLUCOSE 404* 01/07/2015 1052   BUN 18 07/10/2015 1110   BUN 14 01/07/2015 1052   CREATININE 1.06 07/10/2015 1110   CREATININE 0.99 01/07/2015 1052   CALCIUM 9.2 07/10/2015 1110   CALCIUM 9.2 01/07/2015 1052   PROT 8.8* 07/10/2015 1110   PROT 7.7 01/07/2015 1052   ALBUMIN 4.7 07/10/2015 1110   ALBUMIN 4.3 01/07/2015 1052   AST 40 07/10/2015 1110   AST 42* 01/07/2015 1052   ALT 45 07/10/2015 1110   ALT 51 01/07/2015 1052   ALKPHOS 50 07/10/2015 1110   ALKPHOS 64 01/07/2015 1052   BILITOT 1.1 07/10/2015 1110   BILITOT 1.0 01/07/2015 1052   GFRNONAA >60 07/10/2015 1110   GFRNONAA >60 01/07/2015 1052   GFRNONAA >60 10/08/2014 1021   GFRAA >60  07/10/2015 1110   GFRAA >60 01/07/2015 1052   GFRAA >60 10/08/2014 1021   Lab Results  Component Value Date   WBC 9.7 07/10/2015   NEUTROABS 5.7 07/10/2015   HGB 15.5 07/10/2015   HCT 45.9 07/10/2015   MCV 89.0 07/10/2015   PLT 231 07/10/2015    STUDIES: 06/26/14. CT scan of chest/abdomen/pelvis with contrast. IMPRESSION:  No acute process or evidence of metastatic disease in the chest. Surgical sutures in the sigmoid. No evidence of recurrent or metastatic disease. Similar vague hypoattenuation in the left lobe of the liver. Favored to represent focal steatosis. Recommend attention on follow-up. Similar prominence of the caudate lobe of the liver. No specific findings to suggest cirrhosis. Correlate with risk factors for liver disease. Bilateral nephrolithiasis. Indeterminate 7 mm left renal lesion, unchanged. This could represent a hemorrhagic cyst or a small renal neoplasm.  12/24/14 - CT scan of  the chest/abdomen/pelvis. IMPRESSION: Postsurgical changes of partial colectomy, with unremarkable appearance of the surgical site of the sigmoid colon. Unchanged appearance of hypodense focus within segment 3 of the liver, relatively unchanged in size over the course of CT examinations dating to 06/27/2013. There is a second nonspecific hypodense focus within the right liver lobe measuring 4 mm which was not visualized on the prior CT, though may not have been imaged secondary to volume averaging. Recommend attention to these 2 foci on follow-up imaging studies. Indeterminate left kidney lesion again visualized without interval growth. Differential includes complex cyst or small renal cell carcinoma less than 1 cm.  01/23/15 - US kidneys.  IMPRESSION: No evidence of hydronephrosis. 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  06/26/15 - CT scan of the chest/abdomen/pelvis -  IMPRESSION: Status post sigmoid resection. No evidence of recurrent or metastatic disease. RECIST 1.1  criteria as above.  07/10/15 - CEA normal at 0.8.   ASSESSMENT / PLAN:   1. 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 Tumor invades submucosa, margins uninvolved. 1/14 lymph nodes positive for tumor. Patient is enrolled on CALGB clinical trial 80702. He has completed planned 6 cycles of FOLFOX chemotherapy, he continues on oral medication on CALGB clinical trial with celecoxib/placebo. CT scan done on 06/26/15 does not show any evidence of recurrent or metastatic malignancy, left kidney cysts, and will continue to follow up on subsequent CT scans which will be done as per protocol guidelines. Serum CEA from today is normal at 0.8, labs reviewed and explained to patient. Plan is continued close surveillance as per protocol guidelines, he will continue on oral medication on CALGB trial. Will follow-up in about 12 weeks as per protocol guidelines with repeat labs including CBC, met-C, CEA.      2. Peripheral Neuropathy -  mild, grade 1, continues to gradually improve. No need for intervention at this time. Continue to monitor.  3. Diabetes mellitus - Blood sugar elevated but better today 211, postprandial bleeding. Patient states that blood sugar monitoring at home is doing better, and he continues to follow with endocrinologist Dr.Solum (grade 1). 4. h/o Leukocytosis - Has resolved, WBC, normal today. Continue to monitor upon next visit here. 5. Indeterminate left kidney lesion again visualized on CT scan without interval growth. Differential includes complex cyst or small renal cell carcinoma less than 1 cm - per prior d/w urologist Dr.Brandon over the phone, they recommend continued monitoring and consider further workup if there is increase in size. CT scan from 06/26/2015 reports left renal cysts. 6. Radiating pain right upper extremity, intermittent numbness in the  right forearm without muscle weakness, right shoulder area pain, mild neck discomfort - will get x-ray of the right shoulder, xray right humerus to evaluate for pain. Given paresthesias and neck discomfort, will get CT scan of the neck to evaluate for disc disease or occult metastasis given history of colon cancer (patient cannot get MRI since he has defibrillator). 7. In between visits, patient advised to call or come to ER in case of any new symptoms or acute sickness. He is agreeable to this plan.    Leia Alf, MD   07/13/2015 9:17 AM

## 2015-07-15 ENCOUNTER — Ambulatory Visit: Admission: RE | Admit: 2015-07-15 | Payer: Private Health Insurance - Indemnity | Source: Ambulatory Visit

## 2015-07-15 ENCOUNTER — Other Ambulatory Visit: Payer: Self-pay | Admitting: *Deleted

## 2015-07-15 ENCOUNTER — Encounter: Payer: Self-pay | Admitting: *Deleted

## 2015-07-16 ENCOUNTER — Other Ambulatory Visit: Payer: Self-pay | Admitting: Family Medicine

## 2015-07-16 ENCOUNTER — Encounter: Payer: Self-pay | Admitting: Family Medicine

## 2015-07-16 DIAGNOSIS — M542 Cervicalgia: Secondary | ICD-10-CM

## 2015-07-16 HISTORY — DX: Cervicalgia: M54.2

## 2015-07-17 ENCOUNTER — Ambulatory Visit: Admission: RE | Admit: 2015-07-17 | Payer: Private Health Insurance - Indemnity | Source: Ambulatory Visit

## 2015-07-17 ENCOUNTER — Ambulatory Visit
Admission: RE | Admit: 2015-07-17 | Discharge: 2015-07-17 | Disposition: A | Discharge status: Home / Self Care | Payer: Private Health Insurance - Indemnity | Source: Ambulatory Visit | Attending: Oncology | Admitting: Oncology

## 2015-07-17 ENCOUNTER — Other Ambulatory Visit: Payer: Self-pay | Admitting: Family Medicine

## 2015-07-17 ENCOUNTER — Ambulatory Visit
Admission: RE | Admit: 2015-07-17 | Discharge: 2015-07-17 | Disposition: A | Discharge status: Home / Self Care | Payer: Private Health Insurance - Indemnity | Source: Ambulatory Visit | Attending: Internal Medicine | Admitting: Internal Medicine

## 2015-07-17 DIAGNOSIS — C189 Malignant neoplasm of colon, unspecified: Secondary | ICD-10-CM

## 2015-07-17 DIAGNOSIS — M47892 Other spondylosis, cervical region: Secondary | ICD-10-CM | POA: Insufficient documentation

## 2015-07-17 DIAGNOSIS — M542 Cervicalgia: Secondary | ICD-10-CM | POA: Diagnosis present

## 2015-07-17 DIAGNOSIS — M25511 Pain in right shoulder: Secondary | ICD-10-CM | POA: Insufficient documentation

## 2015-10-10 ENCOUNTER — Inpatient Hospital Stay: Payer: Private Health Insurance - Indemnity | Attending: Family Medicine

## 2015-10-10 ENCOUNTER — Inpatient Hospital Stay: Payer: Private Health Insurance - Indemnity | Admitting: Family Medicine

## 2015-10-10 ENCOUNTER — Ambulatory Visit: Payer: Private Health Insurance - Indemnity | Admitting: Internal Medicine

## 2015-11-01 ENCOUNTER — Other Ambulatory Visit: Payer: Self-pay | Admitting: *Deleted

## 2015-11-01 DIAGNOSIS — Z85038 Personal history of other malignant neoplasm of large intestine: Secondary | ICD-10-CM

## 2015-11-04 ENCOUNTER — Inpatient Hospital Stay: Payer: Private Health Insurance - Indemnity

## 2015-11-04 ENCOUNTER — Inpatient Hospital Stay: Payer: Private Health Insurance - Indemnity | Attending: Internal Medicine | Admitting: Internal Medicine

## 2015-11-04 ENCOUNTER — Encounter: Payer: Self-pay | Admitting: *Deleted

## 2015-11-04 VITALS — BP 100/70 | HR 69 | Temp 97.8°F | Resp 18 | Ht 70.0 in | Wt 226.0 lb

## 2015-11-04 DIAGNOSIS — M1990 Unspecified osteoarthritis, unspecified site: Secondary | ICD-10-CM | POA: Diagnosis not present

## 2015-11-04 DIAGNOSIS — C187 Malignant neoplasm of sigmoid colon: Secondary | ICD-10-CM

## 2015-11-04 DIAGNOSIS — N289 Disorder of kidney and ureter, unspecified: Secondary | ICD-10-CM

## 2015-11-04 DIAGNOSIS — Z794 Long term (current) use of insulin: Secondary | ICD-10-CM | POA: Insufficient documentation

## 2015-11-04 DIAGNOSIS — Z7984 Long term (current) use of oral hypoglycemic drugs: Secondary | ICD-10-CM | POA: Insufficient documentation

## 2015-11-04 DIAGNOSIS — I429 Cardiomyopathy, unspecified: Secondary | ICD-10-CM | POA: Diagnosis not present

## 2015-11-04 DIAGNOSIS — E1165 Type 2 diabetes mellitus with hyperglycemia: Secondary | ICD-10-CM

## 2015-11-04 DIAGNOSIS — I10 Essential (primary) hypertension: Secondary | ICD-10-CM | POA: Diagnosis not present

## 2015-11-04 DIAGNOSIS — Z79899 Other long term (current) drug therapy: Secondary | ICD-10-CM | POA: Diagnosis not present

## 2015-11-04 DIAGNOSIS — M25511 Pain in right shoulder: Secondary | ICD-10-CM | POA: Diagnosis not present

## 2015-11-04 DIAGNOSIS — Z85038 Personal history of other malignant neoplasm of large intestine: Secondary | ICD-10-CM | POA: Insufficient documentation

## 2015-11-04 LAB — CBC WITH DIFFERENTIAL/PLATELET
BASOS ABS: 0.1 10*3/uL (ref 0–0.1)
Basophils Relative: 1 %
Eosinophils Absolute: 0.1 10*3/uL (ref 0–0.7)
Eosinophils Relative: 1 %
HEMATOCRIT: 47.2 % (ref 40.0–52.0)
HEMOGLOBIN: 16 g/dL (ref 13.0–18.0)
LYMPHS PCT: 29 %
Lymphs Abs: 2.2 10*3/uL (ref 1.0–3.6)
MCH: 30.4 pg (ref 26.0–34.0)
MCHC: 33.9 g/dL (ref 32.0–36.0)
MCV: 89.5 fL (ref 80.0–100.0)
MONO ABS: 0.8 10*3/uL (ref 0.2–1.0)
Monocytes Relative: 10 %
NEUTROS ABS: 4.6 10*3/uL (ref 1.4–6.5)
Neutrophils Relative %: 59 %
Platelets: 180 10*3/uL (ref 150–440)
RBC: 5.27 MIL/uL (ref 4.40–5.90)
RDW: 15.7 % — AB (ref 11.5–14.5)
WBC: 7.6 10*3/uL (ref 3.8–10.6)

## 2015-11-04 LAB — COMPREHENSIVE METABOLIC PANEL
ALK PHOS: 54 U/L (ref 38–126)
ALT: 42 U/L (ref 17–63)
AST: 27 U/L (ref 15–41)
Albumin: 4.6 g/dL (ref 3.5–5.0)
Anion gap: 6 (ref 5–15)
BILIRUBIN TOTAL: 1.1 mg/dL (ref 0.3–1.2)
BUN: 18 mg/dL (ref 6–20)
CALCIUM: 10 mg/dL (ref 8.9–10.3)
CO2: 25 mmol/L (ref 22–32)
CREATININE: 1.16 mg/dL (ref 0.61–1.24)
Chloride: 103 mmol/L (ref 101–111)
GFR calc Af Amer: >60 mL/min (ref >60)
Glucose, Bld: 134 mg/dL — ABNORMAL HIGH (ref 65–99)
Potassium: 4.8 mmol/L (ref 3.5–5.1)
Sodium: 134 mmol/L — ABNORMAL LOW (ref 135–145)
TOTAL PROTEIN: 8 g/dL (ref 6.5–8.1)

## 2015-11-04 NOTE — Progress Notes (Signed)
The patient states that he has not had his clinical trials tablets of Celebrex vs placebo since Christmas/December 2016.  He was "moving another house and remodeling."  He states that "Dr. Kary Kos told him not to take anymore of the study drug due to hyperglycemia." "I have a full bottle left and that I haven't even opened."

## 2015-11-04 NOTE — Progress Notes (Signed)
[facility] OFFICE PROGRESS NOTE  Patient Care Team: [place] Pink, MD as PCP - General (Family Medicine)   SUMMARY OF ONCOLOGIC HISTORY:  # AUG 2014- COLO CANCER [s/p sigmoid colectomy] STAGE III [pT1a (adeno ca in large tubulovillous adenoma- invades sub-mucosa)pN1(1/4 Ln pos)]; well diff low grade; margins- neg; on CALGB 16109 [FOLFOX x6 treatments- Oct 2014-Dec 2014]- Study drug x 3 years [Oct 2018] placebo Vs.celexocib. Aug 2015- [facility] [Dr.Elliot] Tubular adenoma x 2 [again in Aug 2018]   # PN- resolved  # Hx defib; DM    INTERVAL HISTORY:  This is my first interaction with the patient since I joined the practice September 2016. I reviewed the patient's prior charts/pertinent labs/imaging in detail; findings are summarized above.   A very pleasant 60 year old male patient with above history of colon cancer stage III on protocol CALGB 60454- is here for follow-up  Patient states that he had pain in his right shoulder for few months. For which he was evaluated by PCP-also received a shoulder injection.  As per the patient given his poorly controlled blood sugars- he was taken off his study medication [celecoxib versus placebo]; he has been off it for a month.  Otherwise his appetite is good. No nausea no vomiting. No abdominal pain. No blood in stools black stools. No tingling and numbness.    REVIEW OF SYSTEMS:  A complete 10 point review of system is done which is negative except mentioned above/history of present illness.   PAST MEDICAL HISTORY :  Past Medical History  Diagnosis Date  . Colon cancer ([place])   . Hypertension   . Cardiac arrhythmia 06/22/2013  . Cardiomyopathy ([place]) 06/22/2013  . Diabetes mellitus without complication [facility])     Patient takes Metformin  . Neck pain on right side 07/16/2015    PAST SURGICAL HISTORY :   Past Surgical History  Procedure Laterality Date  . Cholecystectomy    . Colectomy Left 05/25/2013    sigmoid colectomy   . Bladder surgery N/A 06/22/2013    "bladder stretched"  . Rotator cuff repair Left 08/2003  . Biventricular implantable cardioverter-defbrillator Left 04/27/2014    Medtronic biventricular ICD model Consulta CRTD [place], serial [id number]. The right atrial lead is Medtronic [id number], serial # [id number].  The right ventrcular lead is Medtronic [id number], serial # [id number] and the LV lead is [id number] Medtronic serial # [id number]    FAMILY HISTORY :   Family History  Problem Relation Age of Onset  . Cancer Mother   . Diabetes Father   . Stroke Father   . Sleep apnea Sister   . Cancer Maternal Aunt     SOCIAL HISTORY:   Social History  Substance Use Topics  . Smoking status: Never Smoker   . Smokeless tobacco: Not on file  . Alcohol Use: No    ALLERGIES:  is allergic to no known allergies.  MEDICATIONS:  Current Outpatient Prescriptions  Medication Sig Dispense Refill  . canagliflozin (INVOKANA) 300 MG TABS tablet Take 300 mg by mouth daily before breakfast.    . carvedilol (COREG) 3.125 MG tablet Take 3.125 mg by mouth 1 day or 1 dose.    Marland Kitchen glimepiride (AMARYL) 1 MG tablet Take 2 mg by mouth 2 (two) times daily. Take 2 tablets twice daily    . glucose blood (ONE TOUCH ULTRA TEST) test strip Use 2 (two) times daily. Use as instructed.    . Insulin Glargine (LANTUS) 100 UNIT/ML Solostar Pen Inject 22 Units into  the skin 1 day or 1 dose. Once daily at bedtime    . metFORMIN (GLUCOPHAGE) 500 MG tablet Take 1,000 mg by mouth 2 (two) times daily with a meal.    . olmesartan-hydrochlorothiazide (BENICAR HCT) 20-12.5 MG per tablet Take 0.5 tablets by mouth daily.    . simvastatin (ZOCOR) 40 MG tablet Take 40 mg by mouth daily. Once daily at bedtime    . sitaGLIPtin (JANUVIA) 100 MG tablet Take 100 mg by mouth 1 day or 1 dose. In the morning    . meloxicam (MOBIC) 15 MG tablet Take 1 tablet by mouth daily. Reported on 11/04/2015    . traMADol (ULTRAM) 50 MG tablet Take 1 tablet by mouth every 6  (six) hours. Reported on 11/04/2015     No current facility-administered medications for this visit.    PHYSICAL EXAMINATION: ECOG PERFORMANCE STATUS: 0 - Asymptomatic  BP 100/70 mmHg  Pulse 69  Temp(Src) 97.8 F (36.6 C) (Tympanic)  Resp 18  Ht 5\' 10"  (1.778 m)  Wt 225 lb 15.5 oz (102.5 kg)  BMI 32.42 kg/m2  Filed Weights   11/04/15 0832  Weight: 225 lb 15.5 oz (102.5 kg)    GENERAL: Well-nourished well-developed; Alert, no distress and comfortable.  Alone. EYES: no pallor or icterus OROPHARYNX: no thrush or ulceration; good dentition  NECK: supple, no masses felt LYMPH:  no palpable lymphadenopathy in the cervical, axillary or inguinal regions LUNGS: clear to auscultation and  No wheeze or crackles HEART/CVS: regular rate & rhythm and no murmurs; No lower extremity edema ABDOMEN:abdomen soft, non-tender and normal bowel sounds Musculoskeletal:no cyanosis of digits and no clubbing  PSYCH: alert & oriented x 3 with fluent speech NEURO: no focal motor/sensory deficits SKIN:  no rashes or significant lesions  LABORATORY DATA:  I have reviewed the data as listed    Component Value Date/Time   NA 135 07/10/2015 1110   NA 132* 01/07/2015 1052   K 4.2 07/10/2015 1110   K 4.3 01/07/2015 1052   CL 104 07/10/2015 1110   CL 101 01/07/2015 1052   CO2 25 07/10/2015 1110   CO2 23 01/07/2015 1052   GLUCOSE 211* 07/10/2015 1110   GLUCOSE 404* 01/07/2015 1052   BUN 18 07/10/2015 1110   BUN 14 01/07/2015 1052   CREATININE 1.06 07/10/2015 1110   CREATININE 0.99 01/07/2015 1052   CALCIUM 9.2 07/10/2015 1110   CALCIUM 9.2 01/07/2015 1052   PROT 8.8* 07/10/2015 1110   PROT 7.7 01/07/2015 1052   ALBUMIN 4.7 07/10/2015 1110   ALBUMIN 4.3 01/07/2015 1052   AST 40 07/10/2015 1110   AST 42* 01/07/2015 1052   ALT 45 07/10/2015 1110   ALT 51 01/07/2015 1052   ALKPHOS 50 07/10/2015 1110   ALKPHOS 64 01/07/2015 1052   BILITOT 1.1 07/10/2015 1110   BILITOT 1.0 01/07/2015 1052    GFRNONAA >60 07/10/2015 1110   GFRNONAA >60 01/07/2015 1052   GFRNONAA >60 10/08/2014 1021   GFRAA >60 07/10/2015 1110   GFRAA >60 01/07/2015 1052   GFRAA >60 10/08/2014 1021    No results found for: SPEP, UPEP  Lab Results  Component Value Date   WBC 7.6 11/04/2015   NEUTROABS 4.6 11/04/2015   HGB 16.0 11/04/2015   HCT 47.2 11/04/2015   MCV 89.5 11/04/2015   PLT 180 11/04/2015      Chemistry      Component Value Date/Time   NA 135 07/10/2015 1110   NA 132* 01/07/2015 1052   K  4.2 07/10/2015 1110   K 4.3 01/07/2015 1052   CL 104 07/10/2015 1110   CL 101 01/07/2015 1052   CO2 25 07/10/2015 1110   CO2 23 01/07/2015 1052   BUN 18 07/10/2015 1110   BUN 14 01/07/2015 1052   CREATININE 1.06 07/10/2015 1110   CREATININE 0.99 01/07/2015 1052      Component Value Date/Time   CALCIUM 9.2 07/10/2015 1110   CALCIUM 9.2 01/07/2015 1052   ALKPHOS 50 07/10/2015 1110   ALKPHOS 64 01/07/2015 1052   AST 40 07/10/2015 1110   AST 42* 01/07/2015 1052   ALT 45 07/10/2015 1110   ALT 51 01/07/2015 1052   BILITOT 1.1 07/10/2015 1110   BILITOT 1.0 01/07/2015 1052       RADIOGRAPHIC STUDIES: I have personally reviewed the radiological images as listed and agreed with the findings in the report. No results found.   ASSESSMENT & PLAN:   # COLON CA stage III-on CALGB 25366. Clinically no evidence of recurrence noted. Patient had been on placebo versus celecoxib- which is on hold as per PCP [sec to high blood sugars]. We will check with PCP regarding the study drug stoppage. Patient will otherwise follow-up with [place] in approximately 3 months/scans prior blood work.  # Right shoulder pain- Arthritis- unrelated to his prior history of colon cancer.    # PN- G-1- resolved.   # Left Renal indterminate lesion- ~1cm CT- Sep 2016- Stable.  # Diabetes poorly controlled- as per patient improving.   # 15 minutes face-to-face with the patient discussing the above plan of care; more than 50%  of time spent on prognosis/ natural history; counseling and coordination.     Cammie Sickle, MD 11/04/2015 8:39 AM

## 2015-11-04 NOTE — Progress Notes (Signed)
11/03/2014 @8 :30am  Mr. Cicotte returns to clinic for evaluation of continuation on the active portion of the CALGB 28413 research study. Patient missed his return appt scheduled on 10/08/15 and states he either did not receive an appointment card, or he lost it during his recent move. Mr. Esteves states he has not taken the study drug since Christmas, and also reports he could not find his medication calendars, so he did not return them or his study drug. He also reports he still has an unopened bottle of study drug at home. Patient reports he has been experiencing increased right shoulder pain and saw his PCP a few weeks ago. States Dr. Kary Kos prescribed some medication for him and told him to stop the study drug for two weeks. He also told the patient this could be causing his blood sugars to be elevated. Patient's fasting blood sugar this morning was 134 mg/dl - which is drastically improved. He also reports his neuropathy has resolved since last visit. Mr. Badenhop was recently seen by an orthopedic physician who gave him a steroid injection and prescribed another medication and told him to stay off the study drug for another week. Patient states he has not yet picked up the medication and does not know what it is. He is currently taking meloxicam and will be on that medication for a few more weeks. Instructed patient to remain off the study drug while he is taking this medication because it is in the NSAID class just like the study drug, and to call me and let me know the name of the new medication that was prescribed. No new study drug was dispensed this visit. Patient was provided with new med calendars in the event he is able to restart study drug later. Mr. Parady reportedly has a new bottle of celecoxib/placebo that he has not opened at home and thinks he took all of the remaining drug left from the previous bottle that had been sent back home with him at the last office visit.  KSS

## 2015-11-05 LAB — CEA: CEA: 1.3 ng/mL (ref 0.0–4.7)

## 2015-11-26 ENCOUNTER — Telehealth: Payer: Self-pay | Admitting: *Deleted

## 2015-11-26 NOTE — Telephone Encounter (Signed)
T/C made to patient's wife Nehemiah Settle to follow up Mr. Gundlach MD appointment yesterday. He missed his appointment last week due to having a GI bug according to his wife. She verified that the patient's PCP has renewed his prescription for Meloxicam and wants to send him for physical therapy due to his shoulder pain. She states they think he has bursitis in the right shoulder, but it does not seem to be improving. Instructed patient's wife to tell him to continue to hold the study drug and I will instruct them further regarding whether to discontinue it altogether. [name], BSN, MHA, OCN 11/26/2015 4:22 PM

## 2015-12-30 ENCOUNTER — Ambulatory Visit
Admission: RE | Admit: 2015-12-30 | Discharge: 2015-12-30 | Disposition: A | Discharge status: Home / Self Care | Payer: Managed Care, Other (non HMO) | Source: Ambulatory Visit | Attending: Internal Medicine | Admitting: Internal Medicine

## 2015-12-30 DIAGNOSIS — N200 Calculus of kidney: Secondary | ICD-10-CM | POA: Insufficient documentation

## 2015-12-30 DIAGNOSIS — C187 Malignant neoplasm of sigmoid colon: Secondary | ICD-10-CM | POA: Diagnosis present

## 2015-12-30 LAB — POCT I-STAT CREATININE: Creatinine, Ser: 1 mg/dL (ref 0.61–1.24)

## 2015-12-30 MED ORDER — IOPAMIDOL (ISOVUE-300) INJECTION 61%
100.0000 mL | Freq: Once | INTRAVENOUS | Status: AC | PRN
  Administered 2015-12-30: 100 mL via INTRAVENOUS

## 2016-01-16 ENCOUNTER — Inpatient Hospital Stay (HOSPITAL_BASED_OUTPATIENT_CLINIC_OR_DEPARTMENT_OTHER): Payer: Managed Care, Other (non HMO) | Admitting: Internal Medicine

## 2016-01-16 ENCOUNTER — Encounter: Payer: Self-pay | Admitting: *Deleted

## 2016-01-16 ENCOUNTER — Inpatient Hospital Stay: Payer: Managed Care, Other (non HMO) | Attending: Internal Medicine

## 2016-01-16 VITALS — BP 102/75 | HR 81 | Temp 96.8°F | Resp 18 | Wt 225.8 lb

## 2016-01-16 DIAGNOSIS — R944 Abnormal results of kidney function studies: Secondary | ICD-10-CM | POA: Diagnosis not present

## 2016-01-16 DIAGNOSIS — Z9049 Acquired absence of other specified parts of digestive tract: Secondary | ICD-10-CM | POA: Insufficient documentation

## 2016-01-16 DIAGNOSIS — M25511 Pain in right shoulder: Secondary | ICD-10-CM | POA: Insufficient documentation

## 2016-01-16 DIAGNOSIS — R200 Anesthesia of skin: Secondary | ICD-10-CM | POA: Diagnosis not present

## 2016-01-16 DIAGNOSIS — Z794 Long term (current) use of insulin: Secondary | ICD-10-CM

## 2016-01-16 DIAGNOSIS — Z79899 Other long term (current) drug therapy: Secondary | ICD-10-CM | POA: Insufficient documentation

## 2016-01-16 DIAGNOSIS — C187 Malignant neoplasm of sigmoid colon: Secondary | ICD-10-CM

## 2016-01-16 DIAGNOSIS — R531 Weakness: Secondary | ICD-10-CM | POA: Insufficient documentation

## 2016-01-16 DIAGNOSIS — E119 Type 2 diabetes mellitus without complications: Secondary | ICD-10-CM | POA: Diagnosis not present

## 2016-01-16 DIAGNOSIS — Z85038 Personal history of other malignant neoplasm of large intestine: Secondary | ICD-10-CM | POA: Diagnosis not present

## 2016-01-16 DIAGNOSIS — N289 Disorder of kidney and ureter, unspecified: Secondary | ICD-10-CM | POA: Diagnosis not present

## 2016-01-16 DIAGNOSIS — Z006 Encounter for examination for normal comparison and control in clinical research program: Secondary | ICD-10-CM

## 2016-01-16 DIAGNOSIS — Z7984 Long term (current) use of oral hypoglycemic drugs: Secondary | ICD-10-CM

## 2016-01-16 DIAGNOSIS — M1990 Unspecified osteoarthritis, unspecified site: Secondary | ICD-10-CM

## 2016-01-16 DIAGNOSIS — I429 Cardiomyopathy, unspecified: Secondary | ICD-10-CM | POA: Insufficient documentation

## 2016-01-16 DIAGNOSIS — I10 Essential (primary) hypertension: Secondary | ICD-10-CM | POA: Insufficient documentation

## 2016-01-16 LAB — CBC WITH DIFFERENTIAL/PLATELET
Basophils Absolute: 0.1 10*3/uL (ref 0–0.1)
Basophils Relative: 1 %
Eosinophils Absolute: 0.3 10*3/uL (ref 0–0.7)
Eosinophils Relative: 3 %
HEMATOCRIT: 45.4 % (ref 40.0–52.0)
HEMOGLOBIN: 15.7 g/dL (ref 13.0–18.0)
LYMPHS PCT: 27 %
Lymphs Abs: 2.2 10*3/uL (ref 1.0–3.6)
MCH: 31.6 pg (ref 26.0–34.0)
MCHC: 34.7 g/dL (ref 32.0–36.0)
MCV: 91.2 fL (ref 80.0–100.0)
MONO ABS: 0.7 10*3/uL (ref 0.2–1.0)
MONOS PCT: 9 %
NEUTROS ABS: 4.8 10*3/uL (ref 1.4–6.5)
NEUTROS PCT: 60 %
Platelets: 171 10*3/uL (ref 150–440)
RBC: 4.98 MIL/uL (ref 4.40–5.90)
RDW: 14.3 % (ref 11.5–14.5)
WBC: 8.1 10*3/uL (ref 3.8–10.6)

## 2016-01-16 LAB — COMPREHENSIVE METABOLIC PANEL
ALBUMIN: 4.4 g/dL (ref 3.5–5.0)
ALT: 48 U/L (ref 17–63)
ANION GAP: 4 — AB (ref 5–15)
AST: 34 U/L (ref 15–41)
Alkaline Phosphatase: 48 U/L (ref 38–126)
BILIRUBIN TOTAL: 1 mg/dL (ref 0.3–1.2)
BUN: 18 mg/dL (ref 6–20)
CO2: 25 mmol/L (ref 22–32)
Calcium: 9.3 mg/dL (ref 8.9–10.3)
Chloride: 104 mmol/L (ref 101–111)
Creatinine, Ser: 1.26 mg/dL — ABNORMAL HIGH (ref 0.61–1.24)
GFR calc Af Amer: >60 mL/min (ref >60)
Glucose, Bld: 297 mg/dL — ABNORMAL HIGH (ref 65–99)
POTASSIUM: 4.6 mmol/L (ref 3.5–5.1)
Sodium: 133 mmol/L — ABNORMAL LOW (ref 135–145)
TOTAL PROTEIN: 7.9 g/dL (ref 6.5–8.1)

## 2016-01-16 NOTE — Progress Notes (Signed)
Patient is experiencing pain in right shoulder and arm.  States it started when he had his port removed.  Having PT but does not feel it is helping.

## 2016-01-16 NOTE — Progress Notes (Signed)
[name] returns to clinic this morning for 30 month visit on CALGB 29562 study. He reports that he has not taken any of the study drug since before his last visit after Dr. Kary Kos told him to stop taking it. Reports continued pain in right shoulder even though he is receiving physical therapy and has had a steroid injection in the shoulder, and states he is experiencing numbness and tingling in right arm only. He continues to take Aleve for the pain, but has Tramadol for pain as well. Instructed patient that he has been off study drug too long to resume it, and he cannot take it anyway as long as he is requiring NSAIDS to manage his shoulder pain. No further study drug dispensed to Mr. Sannicolas this morning. He states he still has not found his medication calendars, but reports there are a few more boxes they have not unpacked since the move. Stressed the importance of returning the medication calendars if/when he finds them and Mr. Kahane states he will return them if he can locate them. Pill count for the returned bottle was 90 caps, and this bottle had been opened contrary to patient's previous report. Dr. Rogue Bussing in to examine Mr. Rigler and informed him that we will transition to the follow up portion of the 80702 study at this time since the patient will no longer be taking study drug. His follow up appts will now be scheduled every 6 months and he will return to see Dr. Rogue Bussing in 07/2016. CT of chest/abd/pelvis ordered prior to next MD visit. Appts given to patient before leaving the clinic. AE's with grade and attribution as follows: JAVELLE CAPITO AY:[id number]  01/16/2016  Adverse Event Log  Study/Protocol: CALGB 13086 Cycle: 30 month  Event Grade Onset Date Resolved Date Drug Name Attribution Treatment Comments  Arthralgia / Rt shoulder pain  Grade 2 07/10/15   Unrelated  Started post PAC removal  Right arm neuropathy Grade 1 07/10/15   Unlikely  Poss. R/t shoulder   Hyperglycemia  non-fasting Grade 3 01/16/16   Unrelated  Pt reportedly ate candy  [name], BSN, MHA, OCN 01/16/2016 10:26 AM

## 2016-01-16 NOTE — Progress Notes (Signed)
[facility] OFFICE PROGRESS NOTE  Patient Care Team: [place] Pink, MD as PCP - General (Family Medicine)   SUMMARY OF ONCOLOGIC HISTORY:  # AUG 2014- COLO CANCER [s/p sigmoid colectomy] STAGE III [pT1a (adeno ca in large tubulovillous adenoma- invades sub-mucosa)pN1(1/4 Ln pos)]; well diff low grade; margins- neg; on CALGB 91478 [FOLFOX x6 treatments- Oct 2014-Dec 2014]- Study drug x 3 years [Oct 2018] placebo Vs.celexocib. Aug 2015- [facility] [Dr.Elliot] Tubular adenoma x 2 [again in Aug 2018]; CT march 2017- NED  # LEFT KIDNEY cysts/ exophytic lesions <cm- STABLE; cont f/u on CT scan.   # Hx defib; DM    INTERVAL HISTORY:  A very pleasant 60 year old male patient with above history of colon cancer stage III on protocol CALGB 29562- is here for follow-up/review the results of his restaging CAT scan.  Patient has come off celecoxib/placebo because of intolerance/elevated blood sugars as PCP.   Patient's major complaint is right shoulder pain for which he has been currently in physical therapy. Denies any significant improvement. c/o tingling and numbness in the right hand only. Admits to mild weakness.  Otherwise his appetite is good. No nausea no vomiting. No abdominal pain. No blood in stools black stools.     REVIEW OF SYSTEMS:  A complete 10 point review of system is done which is negative except mentioned above/history of present illness.   PAST MEDICAL HISTORY :  Past Medical History  Diagnosis Date  . Hypertension   . Cardiac arrhythmia 06/22/2013  . Cardiomyopathy ([place]) 06/22/2013  . Diabetes mellitus without complication [facility])     Patient takes Metformin  . Neck pain on right side 07/16/2015  . Colon cancer [facility]) 2014    Partial Colon Resection.    PAST SURGICAL HISTORY :   Past Surgical History  Procedure Laterality Date  . Cholecystectomy    . Colectomy Left 05/25/2013    sigmoid colectomy  . Bladder surgery N/A 06/22/2013    "bladder stretched"   . Rotator cuff repair Left 08/2003  . Biventricular implantable cardioverter-defbrillator Left 04/27/2014    Medtronic biventricular ICD model Consulta CRTD [place], serial [id number]. The right atrial lead is Medtronic [id number], serial # [id number].  The right ventrcular lead is Medtronic [id number], serial # [id number] and the LV lead is 4196 Medtronic serial # [id number]    FAMILY HISTORY :   Family History  Problem Relation Age of Onset  . Cancer Mother   . Diabetes Father   . Stroke Father   . Sleep apnea Sister   . Cancer Maternal Aunt     SOCIAL HISTORY:   Social History  Substance Use Topics  . Smoking status: Never Smoker   . Smokeless tobacco: Not on file  . Alcohol Use: No    ALLERGIES:  is allergic to no known allergies.  MEDICATIONS:  Current Outpatient Prescriptions  Medication Sig Dispense Refill  . canagliflozin (INVOKANA) 300 MG TABS tablet Take 300 mg by mouth daily before breakfast.    . carvedilol (COREG) 3.125 MG tablet Take 3.125 mg by mouth 1 day or 1 dose.    Marland Kitchen glimepiride (AMARYL) 1 MG tablet Take 2 mg by mouth 2 (two) times daily. Take 2 tablets twice daily    . glucose blood (ONE TOUCH ULTRA TEST) test strip Use 2 (two) times daily. Use as instructed.    . Insulin Glargine (LANTUS) 100 UNIT/ML Solostar Pen Inject 22 Units into the skin 1 day or 1 dose. Once daily at bedtime    .  metFORMIN (GLUCOPHAGE) 500 MG tablet Take 1,000 mg by mouth 2 (two) times daily with a meal.    . olmesartan-hydrochlorothiazide (BENICAR HCT) 20-12.5 MG per tablet Take 0.5 tablets by mouth daily.    . simvastatin (ZOCOR) 40 MG tablet Take 40 mg by mouth daily. Once daily at bedtime    . sitaGLIPtin (JANUVIA) 100 MG tablet Take 100 mg by mouth 1 day or 1 dose. In the morning    . traMADol (ULTRAM) 50 MG tablet Take 1 tablet by mouth every 6 (six) hours. Reported on 11/04/2015    . FARXIGA 10 MG TABS tablet      No current facility-administered medications for this visit.     PHYSICAL EXAMINATION: ECOG PERFORMANCE STATUS: 0 - Asymptomatic  BP 102/75 mmHg  Pulse 81  Temp(Src) 96.8 F (36 C) (Tympanic)  Resp 18  Wt 225 lb 12 oz (102.4 kg)  Filed Weights   01/16/16 0957  Weight: 225 lb 12 oz (102.4 kg)    GENERAL: Well-nourished well-developed; Alert, no distress and comfortable.  Alone. EYES: no pallor or icterus OROPHARYNX: no thrush or ulceration; good dentition  NECK: supple, no masses felt LYMPH:  no palpable lymphadenopathy in the cervical, axillary or inguinal regions LUNGS: clear to auscultation and  No wheeze or crackles HEART/CVS: regular rate & rhythm and no murmurs; No lower extremity edema ABDOMEN:abdomen soft, non-tender and normal bowel sounds Musculoskeletal:no cyanosis of digits and no clubbing; patient holding his right arm.  PSYCH: alert & oriented x 3 with fluent speech NEURO: no focal motor/sensory deficits SKIN:  no rashes or significant lesions  LABORATORY DATA:  I have reviewed the data as listed    Component Value Date/Time   NA 133* 01/16/2016 0931   NA 132* 01/07/2015 1052   K 4.6 01/16/2016 0931   K 4.3 01/07/2015 1052   CL 104 01/16/2016 0931   CL 101 01/07/2015 1052   CO2 25 01/16/2016 0931   CO2 23 01/07/2015 1052   GLUCOSE 297* 01/16/2016 0931   GLUCOSE 404* 01/07/2015 1052   BUN 18 01/16/2016 0931   BUN 14 01/07/2015 1052   CREATININE 1.26* 01/16/2016 0931   CREATININE 0.99 01/07/2015 1052   CALCIUM 9.3 01/16/2016 0931   CALCIUM 9.2 01/07/2015 1052   PROT 7.9 01/16/2016 0931   PROT 7.7 01/07/2015 1052   ALBUMIN 4.4 01/16/2016 0931   ALBUMIN 4.3 01/07/2015 1052   AST 34 01/16/2016 0931   AST 42* 01/07/2015 1052   ALT 48 01/16/2016 0931   ALT 51 01/07/2015 1052   ALKPHOS 48 01/16/2016 0931   ALKPHOS 64 01/07/2015 1052   BILITOT 1.0 01/16/2016 0931   BILITOT 1.0 01/07/2015 1052   GFRNONAA >60 01/16/2016 0931   GFRNONAA >60 01/07/2015 1052   GFRNONAA >60 10/08/2014 1021   GFRAA >60 01/16/2016  0931   GFRAA >60 01/07/2015 1052   GFRAA >60 10/08/2014 1021    No results found for: SPEP, UPEP  Lab Results  Component Value Date   WBC 8.1 01/16/2016   NEUTROABS 4.8 01/16/2016   HGB 15.7 01/16/2016   HCT 45.4 01/16/2016   MCV 91.2 01/16/2016   PLT 171 01/16/2016      Chemistry      Component Value Date/Time   NA 133* 01/16/2016 0931   NA 132* 01/07/2015 1052   K 4.6 01/16/2016 0931   K 4.3 01/07/2015 1052   CL 104 01/16/2016 0931   CL 101 01/07/2015 1052   CO2 25 01/16/2016 0931  CO2 23 01/07/2015 1052   BUN 18 01/16/2016 0931   BUN 14 01/07/2015 1052   CREATININE 1.26* 01/16/2016 0931   CREATININE 0.99 01/07/2015 1052      Component Value Date/Time   CALCIUM 9.3 01/16/2016 0931   CALCIUM 9.2 01/07/2015 1052   ALKPHOS 48 01/16/2016 0931   ALKPHOS 64 01/07/2015 1052   AST 34 01/16/2016 0931   AST 42* 01/07/2015 1052   ALT 48 01/16/2016 0931   ALT 51 01/07/2015 1052   BILITOT 1.0 01/16/2016 0931   BILITOT 1.0 01/07/2015 1052         ASSESSMENT & PLAN:   # COLON CA stage III-on CALGB 09811. Clinically/Based on CT scans 12/30/2015- no evidence of recurrence noted. Patient had been on placebo versus celecoxib- Discontinued because of intolerance/high blood sugars.   # Intolerance to celecoxib/placebo discontinued in Oct 206 sec to elevated blood sugars.   # Right shoulder pain- Arthritis/ tingling and numbness - unrelated to his prior history of colon cancer.  Recommend follow up with PCP.  # Creatinine slightly elevated 1.26/ blood sugars elevated at 297. Recommend follow up with PCP. Patient given a copy of his labs.  # Left Renal indterminate lesion- ~1cm CT- Sep 2016- Stable.Follow-up as planned with further scans in 6 months.   #  patient will get CBC CMP/CEA 1 week before the next appointment/prior CT scan- follow-up in 6 months     Cammie Sickle, MD 01/16/2016 10:16 AM

## 2016-01-17 LAB — CEA: CEA: 1.6 ng/mL (ref 0.0–4.7)

## 2016-01-29 ENCOUNTER — Other Ambulatory Visit: Payer: Self-pay | Admitting: Orthopedic Surgery

## 2016-01-29 DIAGNOSIS — M5412 Radiculopathy, cervical region: Secondary | ICD-10-CM

## 2016-02-11 ENCOUNTER — Ambulatory Visit
Admission: RE | Admit: 2016-02-11 | Discharge: 2016-02-11 | Disposition: A | Discharge status: Home / Self Care | Payer: Managed Care, Other (non HMO) | Source: Ambulatory Visit | Attending: Orthopedic Surgery | Admitting: Orthopedic Surgery

## 2016-02-11 DIAGNOSIS — M5412 Radiculopathy, cervical region: Secondary | ICD-10-CM | POA: Insufficient documentation

## 2016-04-30 ENCOUNTER — Telehealth: Payer: Self-pay | Admitting: *Deleted

## 2016-04-30 NOTE — Telephone Encounter (Signed)
T/C made to patient for follow up. Spoke with patient's wife [name] [name] who reports he is doing about the same. She states he has not been checking his blood sugars like he should, but that he went to Dr. Francine Graven office (his PCP) this morning and had lab work to check it. [name] [name] reports he also has an appointment with Dr. Vira Agar in GI on 05/22/16 and he will schedule patient's follow up colonoscopy. Wife denies that [name] [name] is experiencing any new problems, and I instructed her to call me if anything new develops, and she agreed. [name], BSN, MHA, OCN 04/30/2016 4:47 PM

## 2016-07-02 ENCOUNTER — Inpatient Hospital Stay: Payer: Managed Care, Other (non HMO) | Attending: Internal Medicine

## 2016-07-02 ENCOUNTER — Ambulatory Visit: Admission: RE | Admit: 2016-07-02 | Payer: Managed Care, Other (non HMO) | Source: Ambulatory Visit

## 2016-07-06 ENCOUNTER — Encounter: Payer: Self-pay | Admitting: *Deleted

## 2016-07-06 ENCOUNTER — Inpatient Hospital Stay: Payer: Managed Care, Other (non HMO) | Admitting: Internal Medicine

## 2016-07-06 DIAGNOSIS — C187 Malignant neoplasm of sigmoid colon: Secondary | ICD-10-CM

## 2016-07-06 NOTE — Progress Notes (Signed)
[name] returns to clinic this morning for scheduled follow up appointment. Dr. Rogue Bussing is going to reschedule him because he did not have his CT scan performed last week as scheduled. [name] states he did not know he had a CT scan scheduled and that he never received an appointment. Spoke with Tia Thaxton who verified that the CT appointment was on the same schedule that his return appointment was on. Also looked back at my last encounter note and I had documented that the CT appointment was given to [name] before he left the clinic that day. Tis had made another appointment for CT on 07/14/16. [name] will come here prior to the CT and have labs collected for CBC, MetC and CEA. He has been scheduled to return and see Dr. Rogue Bussing on Friday - 07/17/16. He will not be billed for today's visit. [name] also reports that he recently saw Dr. Vira Agar, his GI doc and will have his colonoscopy on 08/02/16. [name], BSN, MHA, OCN 07/06/2016 10:14 AM

## 2016-07-14 ENCOUNTER — Other Ambulatory Visit: Payer: Self-pay

## 2016-07-14 ENCOUNTER — Inpatient Hospital Stay: Payer: Managed Care, Other (non HMO) | Attending: Internal Medicine

## 2016-07-14 ENCOUNTER — Ambulatory Visit
Admission: RE | Admit: 2016-07-14 | Discharge: 2016-07-14 | Disposition: A | Discharge status: Home / Self Care | Payer: Managed Care, Other (non HMO) | Source: Ambulatory Visit | Attending: Internal Medicine | Admitting: Internal Medicine

## 2016-07-14 DIAGNOSIS — C187 Malignant neoplasm of sigmoid colon: Secondary | ICD-10-CM

## 2016-07-14 DIAGNOSIS — E1122 Type 2 diabetes mellitus with diabetic chronic kidney disease: Secondary | ICD-10-CM | POA: Diagnosis not present

## 2016-07-14 DIAGNOSIS — N183 Chronic kidney disease, stage 3 (moderate): Secondary | ICD-10-CM | POA: Diagnosis not present

## 2016-07-14 DIAGNOSIS — K4020 Bilateral inguinal hernia, without obstruction or gangrene, not specified as recurrent: Secondary | ICD-10-CM | POA: Insufficient documentation

## 2016-07-14 DIAGNOSIS — I517 Cardiomegaly: Secondary | ICD-10-CM | POA: Insufficient documentation

## 2016-07-14 DIAGNOSIS — I2510 Atherosclerotic heart disease of native coronary artery without angina pectoris: Secondary | ICD-10-CM | POA: Diagnosis not present

## 2016-07-14 DIAGNOSIS — N281 Cyst of kidney, acquired: Secondary | ICD-10-CM | POA: Insufficient documentation

## 2016-07-14 DIAGNOSIS — I429 Cardiomyopathy, unspecified: Secondary | ICD-10-CM | POA: Diagnosis not present

## 2016-07-14 DIAGNOSIS — Z9049 Acquired absence of other specified parts of digestive tract: Secondary | ICD-10-CM | POA: Insufficient documentation

## 2016-07-14 DIAGNOSIS — I499 Cardiac arrhythmia, unspecified: Secondary | ICD-10-CM | POA: Insufficient documentation

## 2016-07-14 DIAGNOSIS — I509 Heart failure, unspecified: Secondary | ICD-10-CM | POA: Diagnosis not present

## 2016-07-14 DIAGNOSIS — Z86010 Personal history of colonic polyps: Secondary | ICD-10-CM | POA: Diagnosis not present

## 2016-07-14 DIAGNOSIS — N289 Disorder of kidney and ureter, unspecified: Secondary | ICD-10-CM | POA: Insufficient documentation

## 2016-07-14 DIAGNOSIS — Z79899 Other long term (current) drug therapy: Secondary | ICD-10-CM | POA: Diagnosis not present

## 2016-07-14 DIAGNOSIS — N200 Calculus of kidney: Secondary | ICD-10-CM | POA: Diagnosis not present

## 2016-07-14 DIAGNOSIS — I130 Hypertensive heart and chronic kidney disease with heart failure and stage 1 through stage 4 chronic kidney disease, or unspecified chronic kidney disease: Secondary | ICD-10-CM | POA: Insufficient documentation

## 2016-07-14 DIAGNOSIS — Z794 Long term (current) use of insulin: Secondary | ICD-10-CM | POA: Insufficient documentation

## 2016-07-14 DIAGNOSIS — Z85038 Personal history of other malignant neoplasm of large intestine: Secondary | ICD-10-CM | POA: Insufficient documentation

## 2016-07-14 HISTORY — DX: Heart failure, unspecified: I50.9

## 2016-07-14 LAB — CBC WITH DIFFERENTIAL/PLATELET
BASOS ABS: 0.1 10*3/uL (ref 0–0.1)
BASOS PCT: 1 %
EOS ABS: 0.4 10*3/uL (ref 0–0.7)
Eosinophils Relative: 4 %
HCT: 48.5 % (ref 40.0–52.0)
HEMOGLOBIN: 16.6 g/dL (ref 13.0–18.0)
LYMPHS ABS: 2.4 10*3/uL (ref 1.0–3.6)
Lymphocytes Relative: 23 %
MCH: 31.6 pg (ref 26.0–34.0)
MCHC: 34.3 g/dL (ref 32.0–36.0)
MCV: 92.3 fL (ref 80.0–100.0)
Monocytes Absolute: 0.9 10*3/uL (ref 0.2–1.0)
Monocytes Relative: 9 %
NEUTROS PCT: 63 %
Neutro Abs: 6.6 10*3/uL — ABNORMAL HIGH (ref 1.4–6.5)
PLATELETS: 179 10*3/uL (ref 150–440)
RBC: 5.26 MIL/uL (ref 4.40–5.90)
RDW: 13.6 % (ref 11.5–14.5)
WBC: 10.4 10*3/uL (ref 3.8–10.6)

## 2016-07-14 LAB — COMPREHENSIVE METABOLIC PANEL
ALT: 40 U/L (ref 17–63)
AST: 36 U/L (ref 15–41)
Albumin: 4.6 g/dL (ref 3.5–5.0)
Alkaline Phosphatase: 43 U/L (ref 38–126)
Anion gap: 9 (ref 5–15)
BUN: 10 mg/dL (ref 6–20)
CHLORIDE: 100 mmol/L — AB (ref 101–111)
CO2: 27 mmol/L (ref 22–32)
CREATININE: 1.09 mg/dL (ref 0.61–1.24)
Calcium: 10.3 mg/dL (ref 8.9–10.3)
GFR calc Af Amer: >60 mL/min (ref >60)
GLUCOSE: 153 mg/dL — AB (ref 65–99)
Potassium: 4.5 mmol/L (ref 3.5–5.1)
Sodium: 136 mmol/L (ref 135–145)
Total Bilirubin: 1.5 mg/dL — ABNORMAL HIGH (ref 0.3–1.2)
Total Protein: 8.1 g/dL (ref 6.5–8.1)

## 2016-07-14 MED ORDER — IOPAMIDOL (ISOVUE-300) INJECTION 61%
100.0000 mL | Freq: Once | INTRAVENOUS | Status: AC | PRN
  Administered 2016-07-14: 100 mL via INTRAVENOUS

## 2016-07-15 LAB — CEA: CEA: 1.2 ng/mL (ref 0.0–4.7)

## 2016-07-17 ENCOUNTER — Inpatient Hospital Stay (HOSPITAL_BASED_OUTPATIENT_CLINIC_OR_DEPARTMENT_OTHER): Payer: Managed Care, Other (non HMO) | Admitting: Internal Medicine

## 2016-07-17 ENCOUNTER — Encounter: Payer: Self-pay | Admitting: Internal Medicine

## 2016-07-17 ENCOUNTER — Encounter: Payer: Self-pay | Admitting: *Deleted

## 2016-07-17 VITALS — BP 101/70 | HR 78 | Temp 95.9°F | Resp 16 | Ht 70.0 in | Wt 232.2 lb

## 2016-07-17 DIAGNOSIS — N183 Chronic kidney disease, stage 3 (moderate): Secondary | ICD-10-CM | POA: Diagnosis not present

## 2016-07-17 DIAGNOSIS — N281 Cyst of kidney, acquired: Secondary | ICD-10-CM | POA: Diagnosis not present

## 2016-07-17 DIAGNOSIS — C187 Malignant neoplasm of sigmoid colon: Secondary | ICD-10-CM

## 2016-07-17 DIAGNOSIS — I130 Hypertensive heart and chronic kidney disease with heart failure and stage 1 through stage 4 chronic kidney disease, or unspecified chronic kidney disease: Secondary | ICD-10-CM

## 2016-07-17 DIAGNOSIS — Z85038 Personal history of other malignant neoplasm of large intestine: Secondary | ICD-10-CM | POA: Diagnosis not present

## 2016-07-17 DIAGNOSIS — Z79899 Other long term (current) drug therapy: Secondary | ICD-10-CM

## 2016-07-17 NOTE — Assessment & Plan Note (Signed)
#   COLON CA stage III-on CALGB 09811. Clinically/Based on CT scans 12/30/2015- no evidence of recurrence noted. CT chest/ab/Pelvis- CT- NED. Get CT scan in 1 year.    # Intolerance to celecoxib/placebo discontinued in Oct 206 sec to elevated blood sugars; on inbsluin now; improved.   # Left Renal indterminate lesion- ~1cm CT- Sep 2016- Stable.Follow-up as planned with further scans in12 months.   #  patient will get CBC CMP/CEA 1 week before the next appointment/  follow-up in 6 months. Will scan again in 12 months.

## 2016-07-17 NOTE — Progress Notes (Signed)
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 Aug 2015- Colo [Dr.Elliot] Tubular adenoma x 2 [again in Aug 2018]; CT march 2017- NED; CT OCT 2017- NED.   # LEFT KIDNEY cysts/ exophytic lesions <cm- STABLE; cont f/u on CT scan.   # Hx defib; DM     Cancer of sigmoid colon ([place])   11/04/2015 Initial Diagnosis    Cancer of sigmoid colon ([place])         INTERVAL HISTORY:  A very pleasant 59 year old male patient with above history of colon cancer stage III on protocol CALGB 09811- is here for follow-up/review the results of his restaging CAT scan.  Patient has come off celecoxib/placebo because of intolerance/elevated blood sugars as PCP.  Otherwise his appetite is good. No nausea no vomiting. No abdominal pain. No blood in stools black stools.  Awaiting for a colonoscopy.   REVIEW OF SYSTEMS:  A complete 10 point review of system is done which is negative except mentioned above/history of present illness.   PAST MEDICAL HISTORY :  Past Medical History:  Diagnosis Date  . Cardiac arrhythmia 06/22/2013  . Cardiomyopathy ([place]) 06/22/2013  . CHF (congestive heart failure) ([place])   . Colon cancer [facility]) 2014   Partial Colon Resection.  . Diabetes mellitus without complication ([place])    Patient takes Metformin  . Hypertension   . Neck pain on right side 07/16/2015    PAST SURGICAL HISTORY :   Past Surgical History:  Procedure Laterality Date  . Biventricular implantable cardioverter-defbrillator Left 04/27/2014   Medtronic biventricular ICD model Consulta CRTD [place],  serial [id number]. The right atrial lead is Medtronic [id number], serial # [id number].  The right ventrcular lead is Medtronic [id number], serial # [id number] and the LV lead is 4196 Medtronic serial # [id number]  . BLADDER SURGERY N/A 06/22/2013   "bladder stretched"  . CHOLECYSTECTOMY    . COLECTOMY Left 05/25/2013   sigmoid colectomy  . ROTATOR CUFF REPAIR Left 08/2003    FAMILY HISTORY :   Family History  Problem Relation Age of Onset  . Cancer Mother   . Diabetes Father   . Stroke Father   . Sleep apnea Sister   . Cancer Maternal Aunt     SOCIAL HISTORY:   Social History  Substance Use Topics  . Smoking status: Never Smoker  . Smokeless tobacco: Never Used  . Alcohol use No    ALLERGIES:  is allergic to no known allergies.  MEDICATIONS:  Current Outpatient Prescriptions  Medication Sig Dispense Refill  . canagliflozin (INVOKANA) 300 MG TABS tablet Take 300 mg by mouth daily before breakfast.    . carvedilol (COREG) 3.125 MG tablet Take 3.125 mg by mouth 1 day or 1 dose.    Marland Kitchen FARXIGA 10 MG TABS tablet     . glimepiride (AMARYL) 1 MG tablet Take 2 mg by mouth 2 (two) times daily. Take 2 tablets twice daily    . glucose blood (ONE TOUCH ULTRA TEST) test strip Use 2 (two) times daily. Use as instructed.    . Insulin Glargine (LANTUS) 100 UNIT/ML Solostar Pen Inject  22 Units into the skin 1 day or 1 dose. Once daily at bedtime    . metFORMIN (GLUCOPHAGE) 500 MG tablet Take 1,000 mg by mouth 2 (two) times daily with a meal.    . olmesartan-hydrochlorothiazide (BENICAR HCT) 20-12.5 MG per tablet Take 0.5 tablets by mouth daily.    . simvastatin (ZOCOR) 40 MG tablet Take 40 mg by mouth daily. Once daily at bedtime    . sitaGLIPtin (JANUVIA) 100 MG tablet Take 100 mg by mouth 1 day or 1 dose. In the morning    . traMADol (ULTRAM) 50 MG tablet Take 1 tablet by mouth every 6 (six) hours. Reported on 11/04/2015     No current facility-administered medications for this visit.     PHYSICAL  EXAMINATION: ECOG PERFORMANCE STATUS: 0 - Asymptomatic  BP 101/70 (BP Location: Left Arm, Patient Position: Sitting)   Pulse 78   Temp (!) 95.9 F (35.5 C) (Tympanic)   Resp 16   Ht 5\' 10"  (1.778 m)   Wt 232 lb 3.2 oz (105.3 kg)   BMI 33.32 kg/m   Filed Weights   07/17/16 0954  Weight: 232 lb 3.2 oz (105.3 kg)    GENERAL: Well-nourished well-developed; Alert, no distress and comfortable.  Alone. EYES: no pallor or icterus OROPHARYNX: no thrush or ulceration; good dentition  NECK: supple, no masses felt LYMPH:  no palpable lymphadenopathy in the cervical, axillary or inguinal regions LUNGS: clear to auscultation and  No wheeze or crackles HEART/CVS: regular rate & rhythm and no murmurs; No lower extremity edema ABDOMEN:abdomen soft, non-tender and normal bowel sounds Musculoskeletal:no cyanosis of digits and no clubbing; patient holding his right arm.  PSYCH: alert & oriented x 3 with fluent speech NEURO: no focal motor/sensory deficits SKIN:  no rashes or significant lesions  LABORATORY DATA:  I have reviewed the data as listed    Component Value Date/Time   NA 136 07/14/2016 0820   NA 132 (L) 01/07/2015 1052   K 4.5 07/14/2016 0820   K 4.3 01/07/2015 1052   CL 100 (L) 07/14/2016 0820   CL 101 01/07/2015 1052   CO2 27 07/14/2016 0820   CO2 23 01/07/2015 1052   GLUCOSE 153 (H) 07/14/2016 0820   GLUCOSE 404 (H) 01/07/2015 1052   BUN 10 07/14/2016 0820   BUN 14 01/07/2015 1052   CREATININE 1.09 07/14/2016 0820   CREATININE 0.99 01/07/2015 1052   CALCIUM 10.3 07/14/2016 0820   CALCIUM 9.2 01/07/2015 1052   PROT 8.1 07/14/2016 0820   PROT 7.7 01/07/2015 1052   ALBUMIN 4.6 07/14/2016 0820   ALBUMIN 4.3 01/07/2015 1052   AST 36 07/14/2016 0820   AST 42 (H) 01/07/2015 1052   ALT 40 07/14/2016 0820   ALT 51 01/07/2015 1052   ALKPHOS 43 07/14/2016 0820   ALKPHOS 64 01/07/2015 1052   BILITOT 1.5 (H) 07/14/2016 0820   BILITOT 1.0 01/07/2015 1052   GFRNONAA >60  07/14/2016 0820   GFRNONAA >60 01/07/2015 1052   GFRAA >60 07/14/2016 0820   GFRAA >60 01/07/2015 1052    No results found for: SPEP, UPEP  Lab Results  Component Value Date   WBC 10.4 07/14/2016   NEUTROABS 6.6 (H) 07/14/2016   HGB 16.6 07/14/2016   HCT 48.5 07/14/2016   MCV 92.3 07/14/2016   PLT 179 07/14/2016      Chemistry      Component Value Date/Time   NA 136 07/14/2016 0820   NA 132 (L) 01/07/2015 1052  K 4.5 07/14/2016 0820   K 4.3 01/07/2015 1052   CL 100 (L) 07/14/2016 0820   CL 101 01/07/2015 1052   CO2 27 07/14/2016 0820   CO2 23 01/07/2015 1052   BUN 10 07/14/2016 0820   BUN 14 01/07/2015 1052   CREATININE 1.09 07/14/2016 0820   CREATININE 0.99 01/07/2015 1052      Component Value Date/Time   CALCIUM 10.3 07/14/2016 0820   CALCIUM 9.2 01/07/2015 1052   ALKPHOS 43 07/14/2016 0820   ALKPHOS 64 01/07/2015 1052   AST 36 07/14/2016 0820   AST 42 (H) 01/07/2015 1052   ALT 40 07/14/2016 0820   ALT 51 01/07/2015 1052   BILITOT 1.5 (H) 07/14/2016 0820   BILITOT 1.0 01/07/2015 1052         ASSESSMENT & PLAN:   Cancer of sigmoid colon ([place]) # COLON CA stage III-on CALGB 16109. Clinically/Based on CT scans 12/30/2015- no evidence of recurrence noted. CT chest/ab/Pelvis- CT- NED. Get CT scan in 1 year.    # Intolerance to celecoxib/placebo discontinued in Oct 206 sec to elevated blood sugars; on inbsluin now; improved.   # Left Renal indterminate lesion- ~1cm CT- Sep 2016- Stable.Follow-up as planned with further scans in12 months.   #  patient will get CBC CMP/CEA 1 week before the next appointment/  follow-up in 6 months. Will scan again in 12 months.       Cammie Sickle, MD 07/18/2016 11:13 AM

## 2016-07-17 NOTE — Progress Notes (Signed)
[name] returns to clinic this morning for hi 36 month follow up on the CALGB 96295 research study and for his CT scan with Dr. Rogue Bussing.  Patient reports he is doing well. Commented on his blood glucose being much improved at 153mg /dL on 07/14/16 and [name] states he is administering his own insulin injections daily now in addition to taking his oral hypoglycemic  Medications. Reports shoulder pain controlled with prn Tramadol, but does still experience occasional numbness/tingling in the right arm. Denies any other problems. Dr. Rogue Bussing has reviewed his CT scan and labs with the patient. [name] reports he is to have a colonoscopy on 08/02/16 with Dr. Vira Agar. Per Dr. Rogue Bussing, patient will have his next CT scan in one year unless there is an indication that he needs one prior to this. Now that he is at the 36 month point, this is within protocol follow up guidelines. Patient will return to clinic for MD follow up assessments every 6 months through 6 years as per protocol. [name] also reports that he and his wife no longer live together, therefore he is responsible for all of his own care now. States he does not like answering the phone, so requests that we leave a message on his machine. Also requests that his appointments be mailed because he is less likely to lose them that way. Ongoing AE's with grade and attribution as follows:  Study/Protocol: CALGB 28413 Cycle: 36 month follow up  Event Grade Onset Date Resolved Date Drug Name Attribution Treatment Comments  Arthralgia / Rt shoulder pain  Grade 1 07/10/15   Unrelated  Started post PAC removal  Right arm neuropathy Grade 1 07/10/15   Unlikely  Poss. R/t shoulder   Hyperglycemia non-fasting Grade 1 07/14/16   Unrelated  Pt reports now taking daily insulin  [name] [name], BSN, [place], OCN 07/17/2016 10:54 AM

## 2016-07-17 NOTE — Progress Notes (Signed)
CT scan on 10/10

## 2016-07-20 ENCOUNTER — Telehealth: Payer: Self-pay | Admitting: *Deleted

## 2016-07-20 NOTE — Telephone Encounter (Signed)
T/C made to Dr. Percell Boston office at patient request to find out about his Prep kit for his upcoming colonoscopy on 08/03/16. Patient states he was not given a prep kit and they did not call it in to his pharmacy. Per Dr. Percell Boston office, they will call in the prep today at the patient's pharmacy of record - which is [facility] on [address] in [place]. Patient was called and message left on his answering machine (also at patient's request)  that the prep was to be called in to [facility] today, and that he can go and pick it up there. [name], BSN, MHA, OCN 07/20/2016 10:15 AM

## 2016-07-31 ENCOUNTER — Encounter: Payer: Self-pay | Admitting: *Deleted

## 2016-08-03 ENCOUNTER — Ambulatory Visit: Payer: Managed Care, Other (non HMO) | Admitting: Anesthesiology

## 2016-08-03 ENCOUNTER — Ambulatory Visit
Admission: RE | Admit: 2016-08-03 | Discharge: 2016-08-03 | Disposition: A | Discharge status: Home / Self Care | Payer: Managed Care, Other (non HMO) | Source: Ambulatory Visit | Attending: Unknown Physician Specialty | Admitting: Unknown Physician Specialty

## 2016-08-03 ENCOUNTER — Encounter
Admission: RE | Disposition: A | Discharge status: Home / Self Care | Payer: Self-pay | Source: Ambulatory Visit | Attending: Unknown Physician Specialty

## 2016-08-03 DIAGNOSIS — E669 Obesity, unspecified: Secondary | ICD-10-CM | POA: Insufficient documentation

## 2016-08-03 DIAGNOSIS — E785 Hyperlipidemia, unspecified: Secondary | ICD-10-CM | POA: Insufficient documentation

## 2016-08-03 DIAGNOSIS — I509 Heart failure, unspecified: Secondary | ICD-10-CM | POA: Diagnosis not present

## 2016-08-03 DIAGNOSIS — Z1211 Encounter for screening for malignant neoplasm of colon: Secondary | ICD-10-CM | POA: Diagnosis present

## 2016-08-03 DIAGNOSIS — E119 Type 2 diabetes mellitus without complications: Secondary | ICD-10-CM | POA: Diagnosis not present

## 2016-08-03 DIAGNOSIS — Z79899 Other long term (current) drug therapy: Secondary | ICD-10-CM | POA: Insufficient documentation

## 2016-08-03 DIAGNOSIS — I110 Hypertensive heart disease with heart failure: Secondary | ICD-10-CM | POA: Insufficient documentation

## 2016-08-03 DIAGNOSIS — Z6833 Body mass index (BMI) 33.0-33.9, adult: Secondary | ICD-10-CM | POA: Diagnosis not present

## 2016-08-03 DIAGNOSIS — I429 Cardiomyopathy, unspecified: Secondary | ICD-10-CM | POA: Diagnosis not present

## 2016-08-03 DIAGNOSIS — Z794 Long term (current) use of insulin: Secondary | ICD-10-CM | POA: Insufficient documentation

## 2016-08-03 DIAGNOSIS — R0601 Orthopnea: Secondary | ICD-10-CM | POA: Diagnosis not present

## 2016-08-03 DIAGNOSIS — Z85038 Personal history of other malignant neoplasm of large intestine: Secondary | ICD-10-CM | POA: Insufficient documentation

## 2016-08-03 DIAGNOSIS — K640 First degree hemorrhoids: Secondary | ICD-10-CM | POA: Diagnosis not present

## 2016-08-03 DIAGNOSIS — I499 Cardiac arrhythmia, unspecified: Secondary | ICD-10-CM | POA: Diagnosis not present

## 2016-08-03 HISTORY — DX: Hyperlipidemia, unspecified: E78.5

## 2016-08-03 HISTORY — PX: COLONOSCOPY WITH PROPOFOL: SHX5780

## 2016-08-03 LAB — GLUCOSE, CAPILLARY: GLUCOSE-CAPILLARY: 135 mg/dL — AB (ref 65–99)

## 2016-08-03 SURGERY — COLONOSCOPY WITH PROPOFOL
Anesthesia: General

## 2016-08-03 MED ORDER — PROPOFOL 500 MG/50ML IV EMUL
INTRAVENOUS | Status: DC | PRN
  Administered 2016-08-03: 100 ug/kg/min via INTRAVENOUS

## 2016-08-03 MED ORDER — MIDAZOLAM HCL 2 MG/2ML IJ SOLN
INTRAMUSCULAR | Status: DC | PRN
  Administered 2016-08-03: 2 mg via INTRAVENOUS

## 2016-08-03 MED ORDER — SODIUM CHLORIDE 0.9 % IV SOLN
INTRAVENOUS | Status: DC
  Administered 2016-08-03: 1000 mL via INTRAVENOUS

## 2016-08-03 MED ORDER — PHENYLEPHRINE HCL 10 MG/ML IJ SOLN
INTRAMUSCULAR | Status: DC | PRN
  Administered 2016-08-03: 50 ug via INTRAVENOUS
  Administered 2016-08-03: 100 ug via INTRAVENOUS
  Administered 2016-08-03 (×3): 50 ug via INTRAVENOUS
  Administered 2016-08-03: 100 ug via INTRAVENOUS

## 2016-08-03 MED ORDER — PROPOFOL 10 MG/ML IV BOLUS
INTRAVENOUS | Status: DC | PRN
  Administered 2016-08-03: 50 mg via INTRAVENOUS
  Administered 2016-08-03: 10 mg via INTRAVENOUS

## 2016-08-03 MED ORDER — SODIUM CHLORIDE 0.9 % IV SOLN: INTRAVENOUS | Status: DC

## 2016-08-03 NOTE — Anesthesia Preprocedure Evaluation (Addendum)
Anesthesia Evaluation  Patient identified by MRN, date of birth, ID band Patient awake    Reviewed: Allergy & Precautions, NPO status , Patient's Chart, lab work & pertinent test results, reviewed documented beta blocker date and time   Airway Mallampati: II  TM Distance: >3 FB Neck ROM: Full    Dental no notable dental hx.    Pulmonary neg pulmonary ROS, neg sleep apnea, neg COPD,    breath sounds clear to auscultation- rhonchi (-) wheezing      Cardiovascular Exercise Tolerance: Good hypertension, Pt. on medications and Pt. on home beta blockers +CHF and + Orthopnea  (-) CAD and (-) Past MI + pacemaker + Cardiac Defibrillator  Rhythm:Regular Rate:Normal - Systolic murmurs and - Diastolic murmurs Echo [id number]: SEVERE LV SYSTOLIC DYSFUNCTION (See above) NORMAL RIGHT VENTRICULAR SYSTOLIC FUNCTION MILD VALVULAR REGURGITATION (See above) NO VALVULAR STENOSIS PACER WIRE NOTED Closest EF: 25% (Estimated) Contraction: SEVERE GLOBAL DECREASE Tricuspid: MILD TR Mitral: TRIVIAL MR   Neuro/Psych negative neurological ROS  negative psych ROS   GI/Hepatic negative GI ROS, Neg liver ROS,   Endo/Other  diabetes, Type 2, Insulin Dependent, Oral Hypoglycemic Agents  Renal/GU negative Renal ROS     Musculoskeletal   Abdominal (+) + obese,   Peds  Hematology negative hematology ROS (+)   Anesthesia Other Findings Past Medical History: 06/22/2013: Cardiac arrhythmia 06/22/2013: Cardiomyopathy ([place]) No date: CHF (congestive heart failure) ([place]) 2014: Colon cancer ([place])     Comment: Partial Colon Resection. No date: Diabetes mellitus without complication ([facility])     Comment: Patient takes Metformin No date: Hyperlipidemia No date: Hypertension 07/16/2015: Neck pain on right side   Reproductive/Obstetrics                            Anesthesia Physical Anesthesia Plan  ASA: III  Anesthesia Plan:  General   Post-op Pain Management:    Induction: Intravenous  Airway Management Planned: Natural Airway  Additional Equipment:   Intra-op Plan:   Post-operative Plan:   Informed Consent: I have reviewed the patients History and Physical, chart, labs and discussed the procedure including the risks, benefits and alternatives for the proposed anesthesia with the patient or authorized representative who has indicated his/her understanding and acceptance.   Dental advisory given  Plan Discussed with: CRNA and Anesthesiologist  Anesthesia Plan Comments:         Anesthesia Quick Evaluation

## 2016-08-03 NOTE — Transfer of Care (Signed)
Immediate Anesthesia Transfer of Care Note  Patient: [name] [name]  Procedure(s) Performed: Procedure(s): COLONOSCOPY WITH PROPOFOL (N/A)  Patient Location: PACU  Anesthesia Type:General  Level of Consciousness: sedated  Airway & Oxygen Therapy: Patient Spontanous Breathing and Patient connected to nasal cannula oxygen  Post-op Assessment: Report given to RN and Post -op Vital signs reviewed and stable  Post vital signs: Reviewed and stable  Last Vitals:  Vitals:   08/03/16 0636 08/03/16 0700  BP: 99/76   Pulse: 82   Resp: 16   Temp: (!) 34.7 C (P) 36.4 C    Last Pain:  Vitals:   08/03/16 0700  TempSrc: (P) Tympanic         Complications: No apparent anesthesia complications

## 2016-08-03 NOTE — H&P (Signed)
Primary Care Physician:  [place] Pink, MD Primary Gastroenterologist:  Dr. Vira Agar  Pre-Procedure History & Physical: HPI:  [name] [name] is a 60 y.o. male is here for an colonoscopy.   Past Medical History:  Diagnosis Date  . Cardiac arrhythmia 06/22/2013  . Cardiomyopathy ([place]) 06/22/2013  . CHF (congestive heart failure) ([place])   . Colon cancer [facility]) 2014   Partial Colon Resection.  . Diabetes mellitus without complication ([place])    Patient takes Metformin  . Hyperlipidemia   . Hypertension   . Neck pain on right side 07/16/2015    Past Surgical History:  Procedure Laterality Date  . Biventricular implantable cardioverter-defbrillator Left 04/27/2014   Medtronic biventricular ICD model Consulta CRTD [place], serial [id number]. The right atrial lead is Medtronic [id number], serial # [id number].  The right ventrcular lead is Medtronic [id number], serial # [id number] and the LV lead is 4196 Medtronic serial # [id number]  . BLADDER SURGERY N/A 06/22/2013   "bladder stretched"  . CARDIAC CATHETERIZATION    . CHOLECYSTECTOMY    . COLECTOMY Left 05/25/2013   sigmoid colectomy  . ROTATOR CUFF REPAIR Left 08/2003    Prior to Admission medications   Medication Sig Start Date End Date Taking? Authorizing Provider  canagliflozin (INVOKANA) 300 MG TABS tablet Take 300 mg by mouth daily before breakfast.   Yes Historical Provider, MD  carvedilol (COREG) 3.125 MG tablet Take 3.125 mg by mouth 1 day or 1 dose. 03/30/13  Yes Historical Provider, MD  FARXIGA 10 MG TABS tablet  12/30/15  Yes Historical Provider, MD  glimepiride (AMARYL) 1 MG tablet Take 2 mg by mouth 2 (two) times daily. Take 2 tablets twice daily 03/30/13  Yes Historical Provider, MD  glucose blood (ONE TOUCH ULTRA TEST) test strip Use 2 (two) times daily. Use as instructed. 09/21/14  Yes Historical Provider, MD  Insulin Glargine (LANTUS) 100 UNIT/ML Solostar Pen Inject 22 Units into the skin 1 day or 1 dose. Once daily at bedtime  08/09/13  Yes Historical Provider, MD  meloxicam (MOBIC) 15 MG tablet Take 15 mg by mouth daily.   Yes Historical Provider, MD  metFORMIN (GLUCOPHAGE) 500 MG tablet Take 1,000 mg by mouth 2 (two) times daily with a meal. 03/30/13  Yes Historical Provider, MD  olmesartan-hydrochlorothiazide (BENICAR HCT) 20-12.5 MG per tablet Take 0.5 tablets by mouth daily. 03/30/13  Yes Historical Provider, MD  simvastatin (ZOCOR) 40 MG tablet Take 40 mg by mouth daily. Once daily at bedtime 11/09/08  Yes Historical Provider, MD  sitaGLIPtin (JANUVIA) 100 MG tablet Take 100 mg by mouth 1 day or 1 dose. In the morning 05/22/13  Yes Historical Provider, MD  traMADol (ULTRAM) 50 MG tablet Take 1 tablet by mouth every 6 (six) hours. Reported on 11/04/2015 10/28/15  Yes Historical Provider, MD    Allergies as of 07/14/2016 - Review Complete 07/14/2016  Allergen Reaction Noted  . No known allergies  01/08/2015    Family History  Problem Relation Age of Onset  . Cancer Mother   . Diabetes Father   . Stroke Father   . Sleep apnea Sister   . Cancer Maternal Aunt     Social History   Social History  . Marital status: Married    Spouse name: N/A  . Number of children: N/A  . Years of education: N/A   Occupational History  . Not on file.   Social History Main Topics  . Smoking status: Never Smoker  . Smokeless tobacco: Never  Used  . Alcohol use No  . Drug use: No  . Sexual activity: Not on file   Other Topics Concern  . Not on file   Social History Narrative  . No narrative on file    Review of Systems: See HPI, otherwise negative ROS  Physical Exam: BP 99/76   Pulse 82   Temp (!) 94.4 F (34.7 C) (Tympanic)   Resp 16   Ht 5\' 9"  (1.753 m)   Wt 104.3 kg (230 lb)   SpO2 99%   BMI 33.97 kg/m  General:   Alert,  pleasant and cooperative in NAD Head:  Normocephalic and atraumatic. Neck:  Supple; no masses or thyromegaly. Lungs:  Clear throughout to auscultation.    Heart:  Regular rate and  rhythm. Abdomen:  Soft, nontender and nondistended. Normal bowel sounds, without guarding, and without rebound.   Neurologic:  Alert and  oriented x4;  grossly normal neurologically.  Impression/Plan: [name] [name] is here for an colonoscopy to be performed for [name] colon cancer  Risks, benefits, limitations, and alternatives regarding  colonoscopy have been reviewed with the patient.  Questions have been answered.  All parties agreeable.   Gaylyn Cheers, MD  08/03/2016, 7:33 AM

## 2016-08-03 NOTE — Anesthesia Postprocedure Evaluation (Signed)
Anesthesia Post Note  Patient: [name] [name]  Procedure(s) Performed: Procedure(s) (LRB): COLONOSCOPY WITH PROPOFOL (N/A)  Patient location during evaluation: Endoscopy Anesthesia Type: General Level of consciousness: awake and alert and oriented Pain management: pain level controlled Vital Signs Assessment: post-procedure vital signs reviewed and stable Respiratory status: spontaneous breathing, nonlabored ventilation and respiratory function stable Cardiovascular status: blood pressure returned to baseline and stable Postop Assessment: no signs of nausea or vomiting Anesthetic complications: no    Last Vitals:  Vitals:   08/03/16 0817 08/03/16 0827  BP: 98/72 97/76  Pulse: 77 74  Resp: 17 15  Temp:      Last Pain:  Vitals:   08/03/16 0757  TempSrc: Tympanic  PainSc: Asleep                 Cote Mayabb

## 2016-08-03 NOTE — Op Note (Signed)
[facility] Gastroenterology Patient Name: Garcia Carrisalez Procedure Date: 08/03/2016 7:27 AM MRN: [id number] Account #: [id number] Date of Birth: July 22, 1956 Admit Type: Outpatient Age: 60 Room: [facility] ENDO ROOM 4 Gender: Male Note Status: Finalized Procedure:            Colonoscopy Indications:          High risk colon cancer surveillance: Personal history                        of colon cancer Providers:            Manya Silvas, MD Referring MD:         Irven Easterly. Kary Kos, MD (Referring MD) Medicines:            Propofol per Anesthesia Complications:        No immediate complications. Procedure:            Pre-Anesthesia Assessment:                       - After reviewing the risks and benefits, the patient                        was deemed in satisfactory condition to undergo the                        procedure.                       After obtaining informed consent, the colonoscope was                        passed under direct vision. Throughout the procedure,                        the patient's blood pressure, pulse, and oxygen                        saturations were monitored continuously. The                        Colonoscope was introduced through the anus and                        advanced to the the cecum, identified by appendiceal                        orifice and ileocecal valve. The colonoscopy was                        performed without difficulty. The patient tolerated the                        procedure well. The quality of the bowel preparation                        was excellent. Findings:      Internal hemorrhoids were found during endoscopy. The hemorrhoids were       small and Grade I (internal hemorrhoids that do not prolapse).      The exam was otherwise without abnormality. Impression:           -  Internal hemorrhoids.                       - The examination was otherwise normal.                       - No specimens  collected. Recommendation:       - Repeat colonoscopy in 2 years for surveillance. Manya Silvas, MD 08/03/2016 7:54:10 AM This report has been signed electronically. Number of Addenda: 0 Note Initiated On: 08/03/2016 7:27 AM Scope Withdrawal Time: 0 hours 8 minutes 49 seconds  Total Procedure Duration: 0 hours 12 minutes 31 seconds       [facility]

## 2016-08-04 ENCOUNTER — Encounter: Payer: Self-pay | Admitting: Unknown Physician Specialty

## 2017-01-15 ENCOUNTER — Encounter: Payer: Self-pay | Admitting: *Deleted

## 2017-01-15 ENCOUNTER — Inpatient Hospital Stay: Payer: Commercial Managed Care - PPO

## 2017-01-15 ENCOUNTER — Inpatient Hospital Stay: Payer: Commercial Managed Care - PPO | Attending: Internal Medicine | Admitting: Internal Medicine

## 2017-01-15 VITALS — BP 97/60 | HR 85 | Temp 98.3°F | Resp 16 | Wt 228.0 lb

## 2017-01-15 DIAGNOSIS — E785 Hyperlipidemia, unspecified: Secondary | ICD-10-CM | POA: Diagnosis not present

## 2017-01-15 DIAGNOSIS — I110 Hypertensive heart disease with heart failure: Secondary | ICD-10-CM | POA: Diagnosis not present

## 2017-01-15 DIAGNOSIS — Z794 Long term (current) use of insulin: Secondary | ICD-10-CM | POA: Insufficient documentation

## 2017-01-15 DIAGNOSIS — I429 Cardiomyopathy, unspecified: Secondary | ICD-10-CM | POA: Insufficient documentation

## 2017-01-15 DIAGNOSIS — Z79899 Other long term (current) drug therapy: Secondary | ICD-10-CM | POA: Insufficient documentation

## 2017-01-15 DIAGNOSIS — I509 Heart failure, unspecified: Secondary | ICD-10-CM | POA: Insufficient documentation

## 2017-01-15 DIAGNOSIS — N281 Cyst of kidney, acquired: Secondary | ICD-10-CM | POA: Insufficient documentation

## 2017-01-15 DIAGNOSIS — Z85038 Personal history of other malignant neoplasm of large intestine: Secondary | ICD-10-CM | POA: Diagnosis not present

## 2017-01-15 DIAGNOSIS — Z9221 Personal history of antineoplastic chemotherapy: Secondary | ICD-10-CM | POA: Insufficient documentation

## 2017-01-15 DIAGNOSIS — E119 Type 2 diabetes mellitus without complications: Secondary | ICD-10-CM | POA: Diagnosis not present

## 2017-01-15 DIAGNOSIS — C187 Malignant neoplasm of sigmoid colon: Secondary | ICD-10-CM

## 2017-01-15 LAB — CBC WITH DIFFERENTIAL/PLATELET
BASOS ABS: 0.1 10*3/uL (ref 0–0.1)
BASOS PCT: 1 %
EOS PCT: 5 %
Eosinophils Absolute: 0.5 10*3/uL (ref 0–0.7)
HEMATOCRIT: 47.5 % (ref 40.0–52.0)
Hemoglobin: 16.3 g/dL (ref 13.0–18.0)
Lymphocytes Relative: 27 %
Lymphs Abs: 2.6 10*3/uL (ref 1.0–3.6)
MCH: 31.1 pg (ref 26.0–34.0)
MCHC: 34.5 g/dL (ref 32.0–36.0)
MCV: 90.3 fL (ref 80.0–100.0)
MONO ABS: 0.7 10*3/uL (ref 0.2–1.0)
Monocytes Relative: 8 %
Neutro Abs: 5.9 10*3/uL (ref 1.4–6.5)
Neutrophils Relative %: 61 %
Platelets: 188 10*3/uL (ref 150–440)
RBC: 5.25 MIL/uL (ref 4.40–5.90)
RDW: 14 % (ref 11.5–14.5)
WBC: 9.8 10*3/uL (ref 3.8–10.6)

## 2017-01-15 LAB — COMPREHENSIVE METABOLIC PANEL
ALBUMIN: 4.3 g/dL (ref 3.5–5.0)
ALK PHOS: 46 U/L (ref 38–126)
ALT: 41 U/L (ref 17–63)
AST: 33 U/L (ref 15–41)
Anion gap: 8 (ref 5–15)
BILIRUBIN TOTAL: 1.4 mg/dL — AB (ref 0.3–1.2)
BUN: 14 mg/dL (ref 6–20)
CO2: 27 mmol/L (ref 22–32)
CREATININE: 1.19 mg/dL (ref 0.61–1.24)
Calcium: 10.1 mg/dL (ref 8.9–10.3)
Chloride: 100 mmol/L — ABNORMAL LOW (ref 101–111)
GFR calc Af Amer: >60 mL/min (ref >60)
GLUCOSE: 267 mg/dL — AB (ref 65–99)
POTASSIUM: 4.2 mmol/L (ref 3.5–5.1)
Sodium: 135 mmol/L (ref 135–145)
TOTAL PROTEIN: 7.8 g/dL (ref 6.5–8.1)

## 2017-01-15 NOTE — Progress Notes (Unsigned)
T/C made to patient to inform about dates and times of CT scan and next MD appointment. Patient requests a reminder call. Also asked patient about his peripheral neuropathy and patient states it is completely gone now and he hasn't experienced any neuropathy since last fall. [name], BSN, MHA, OCN 01/15/2017 2:01 PM

## 2017-01-15 NOTE — Progress Notes (Signed)
[facility] OFFICE PROGRESS NOTE  Patient Care Team: [place] Pink, MD as PCP - General (Family Medicine)   SUMMARY OF ONCOLOGIC HISTORY:  Oncology History   # AUG 2014- COLO CANCER [s/p sigmoid colectomy] STAGE III [pT1a (adeno ca in large tubulovillous adenoma- invades sub-mucosa)pN1(1/4 Ln pos)]; well diff low grade; margins- neg; on CALGB 49702 [FOLFOX x6 treatments- Oct 2014-Dec 2014]- Study drug x 3 years [Oct 2018] placebo Vs.celexocib. Aug 2015- Colo [Dr.Elliot] Tubular adenoma x 2 ; CT march 2017- NED; CT OCT 2017- NED; colo- oct 2017 [repeat in 2019]  # LEFT KIDNEY cysts/ exophytic lesions <cm- STABLE; cont f/u on CT scan.   # Hx defib; DM     Cancer of sigmoid colon ([place])   11/04/2015 Initial Diagnosis    Cancer of sigmoid colon ([place])         INTERVAL HISTORY:  A very pleasant 61 year old male patient with above history of colon cancer stage III on protocol CALGB 63785- is here for follow-up.   Patient stated that his blood sugars are doing well at home. He continues to be on insulin. Otherwise his appetite is good. No nausea no vomiting. No abdominal pain. No blood in stools black stools.   More recently he was noted to have "fluid around the heart"- started on Lasix by cardiology.   REVIEW OF SYSTEMS:  A complete 10 point review of system is done which is negative except mentioned above/history of present illness.   PAST MEDICAL HISTORY :  Past Medical History:  Diagnosis Date  . Cardiac arrhythmia 06/22/2013  . Cardiomyopathy ([place]) 06/22/2013  . CHF (congestive heart failure) ([place])   . Colon cancer [facility]) 2014   Partial Colon Resection.  . Diabetes mellitus without complication ([place])    Patient takes Metformin  . Hyperlipidemia   . Hypertension   . Neck pain on right side 07/16/2015    PAST SURGICAL HISTORY :   Past Surgical History:  Procedure Laterality Date  . Biventricular implantable cardioverter-defbrillator Left 04/27/2014   Medtronic biventricular ICD model Consulta CRTD [place], serial [id number]. The right atrial lead is Medtronic [id number], serial # [id number].  The right ventrcular lead is Medtronic [id number], serial # [id number] and the LV lead is 4196 Medtronic serial # [id number]  . BLADDER SURGERY N/A 06/22/2013   "bladder stretched"  . CARDIAC CATHETERIZATION    . CHOLECYSTECTOMY    . COLECTOMY Left 05/25/2013   sigmoid colectomy  . COLONOSCOPY WITH PROPOFOL N/A 08/03/2016   Procedure: COLONOSCOPY WITH PROPOFOL;  Surgeon: Manya Silvas, MD;  Location[facility] ENDOSCOPY;  Service: Endoscopy;  Laterality: N/A;  . ROTATOR CUFF REPAIR Left 08/2003    FAMILY HISTORY :   Family History  Problem Relation Age of Onset  . Cancer Mother   . Diabetes Father   . Stroke Father   . Sleep apnea Sister   . Cancer Maternal Aunt     SOCIAL HISTORY:   Social History  Substance Use Topics  . Smoking status: Never Smoker  . Smokeless tobacco: Never Used  . Alcohol use No    ALLERGIES:  is allergic to no known allergies.  MEDICATIONS:  Current Outpatient Prescriptions  Medication Sig Dispense Refill  . canagliflozin (INVOKANA) 300 MG TABS tablet Take 300 mg by mouth daily before breakfast.    . carvedilol (COREG) 3.125 MG tablet Take 3.125 mg by mouth 1 day or 1 dose.    Marland Kitchen FARXIGA 10 MG TABS tablet     .  furosemide (LASIX) 20 MG tablet Take 20 mg by mouth daily as needed.    Marland Kitchen glimepiride (AMARYL) 1 MG tablet Take 2 mg by mouth 2 (two) times daily. Take 2 tablets twice daily    . glucose blood (ONE TOUCH ULTRA TEST) test strip Use 2 (two) times daily. Use as instructed.    . Insulin Glargine (LANTUS) 100 UNIT/ML Solostar Pen Inject 20 Units into the skin 1 day or 1 dose. Once daily at bedtime     . meloxicam (MOBIC) 15 MG tablet Take 15 mg by mouth daily.    . metFORMIN (GLUCOPHAGE) 500 MG tablet Take 1,000 mg by mouth 2 (two) times daily with a meal.    . olmesartan-hydrochlorothiazide (BENICAR HCT) 20-12.5 MG  per tablet Take 0.5 tablets by mouth daily.    . simvastatin (ZOCOR) 40 MG tablet Take 40 mg by mouth daily. Once daily at bedtime    . sitaGLIPtin (JANUVIA) 100 MG tablet Take 100 mg by mouth 1 day or 1 dose. In the morning    . traMADol (ULTRAM) 50 MG tablet Take 1 tablet by mouth every 6 (six) hours. Reported on 11/04/2015     No current facility-administered medications for this visit.     PHYSICAL EXAMINATION: ECOG PERFORMANCE STATUS: 0 - Asymptomatic  BP 97/60 (BP Location: Left Arm, Patient Position: Sitting)   Pulse 85   Temp 98.3 F (36.8 C) (Tympanic)   Resp 16   Wt 228 lb (103.4 kg)   BMI 33.67 kg/m   Filed Weights   01/15/17 1038  Weight: 228 lb (103.4 kg)    GENERAL: Well-nourished well-developed; Alert, no distress and comfortable.  Alone. EYES: no pallor or icterus OROPHARYNX: no thrush or ulceration; good dentition  NECK: supple, no masses felt LYMPH:  no palpable lymphadenopathy in the cervical, axillary or inguinal regions LUNGS: clear to auscultation and  No wheeze or crackles HEART/CVS: regular rate & rhythm and no murmurs; No lower extremity edema ABDOMEN:abdomen soft, non-tender and normal bowel sounds Musculoskeletal:no cyanosis of digits and no clubbing; PSYCH: alert & oriented x 3 with fluent speech NEURO: no focal motor/sensory deficits SKIN:  no rashes or significant lesions  LABORATORY DATA:  I have reviewed the data as listed    Component Value Date/Time   NA 135 01/15/2017 1022   NA 132 (L) 01/07/2015 1052   K 4.2 01/15/2017 1022   K 4.3 01/07/2015 1052   CL 100 (L) 01/15/2017 1022   CL 101 01/07/2015 1052   CO2 27 01/15/2017 1022   CO2 23 01/07/2015 1052   GLUCOSE 267 (H) 01/15/2017 1022   GLUCOSE 404 (H) 01/07/2015 1052   BUN 14 01/15/2017 1022   BUN 14 01/07/2015 1052   CREATININE 1.19 01/15/2017 1022   CREATININE 0.99 01/07/2015 1052   CALCIUM 10.1 01/15/2017 1022   CALCIUM 9.2 01/07/2015 1052   PROT 7.8 01/15/2017 1022    PROT 7.7 01/07/2015 1052   ALBUMIN 4.3 01/15/2017 1022   ALBUMIN 4.3 01/07/2015 1052   AST 33 01/15/2017 1022   AST 42 (H) 01/07/2015 1052   ALT 41 01/15/2017 1022   ALT 51 01/07/2015 1052   ALKPHOS 46 01/15/2017 1022   ALKPHOS 64 01/07/2015 1052   BILITOT 1.4 (H) 01/15/2017 1022   BILITOT 1.0 01/07/2015 1052   GFRNONAA >60 01/15/2017 1022   GFRNONAA >60 01/07/2015 1052   GFRAA >60 01/15/2017 1022   GFRAA >60 01/07/2015 1052    No results found for: SPEP, UPEP  Lab Results  Component Value Date   WBC 9.8 01/15/2017   NEUTROABS 5.9 01/15/2017   HGB 16.3 01/15/2017   HCT 47.5 01/15/2017   MCV 90.3 01/15/2017   PLT 188 01/15/2017      Chemistry      Component Value Date/Time   NA 135 01/15/2017 1022   NA 132 (L) 01/07/2015 1052   K 4.2 01/15/2017 1022   K 4.3 01/07/2015 1052   CL 100 (L) 01/15/2017 1022   CL 101 01/07/2015 1052   CO2 27 01/15/2017 1022   CO2 23 01/07/2015 1052   BUN 14 01/15/2017 1022   BUN 14 01/07/2015 1052   CREATININE 1.19 01/15/2017 1022   CREATININE 0.99 01/07/2015 1052      Component Value Date/Time   CALCIUM 10.1 01/15/2017 1022   CALCIUM 9.2 01/07/2015 1052   ALKPHOS 46 01/15/2017 1022   ALKPHOS 64 01/07/2015 1052   AST 33 01/15/2017 1022   AST 42 (H) 01/07/2015 1052   ALT 41 01/15/2017 1022   ALT 51 01/07/2015 1052   BILITOT 1.4 (H) 01/15/2017 1022   BILITOT 1.0 01/07/2015 1052         ASSESSMENT & PLAN:   Cancer of sigmoid colon ([place]) # COLON CA stage III-on CALGB 19622. Clinically- NED. Last Oct 2018- CT chest/ab/Pelvis- CT- NED. Get CT scan in 6 months. Last colo- oct 2017 again recommended in 2 years.  # Intolerance to celecoxib/placebo discontinued in Oct 2016 sec to elevated blood sugars; on insluin now; improved. However, today- 267. Defer to PCP for control.  # Left Renal indterminate lesion- ~1cm CT- Sep 2016- Stable.Follow-up as planned with further scans in12 months.   #  patient will get CBC CMP/CEA 1 week  before the next appointment/  follow-up in 6 months; CT scan prior. Discussed with the clinical trials nurse.      Cammie Sickle, MD 01/15/2017 1:05 PM

## 2017-01-15 NOTE — Progress Notes (Signed)
Patient here today for follow up.  Patient states no new concerns today  

## 2017-01-15 NOTE — Assessment & Plan Note (Addendum)
#   COLON CA stage III-on CALGB 41937. Clinically- NED. Last Oct 2017- CT chest/ab/Pelvis- CT- NED. Get CT scan in 6 months. Last colo- oct 2017 again recommended in 2 years.  # Intolerance to celecoxib/placebo discontinued in Oct 2016 sec to elevated blood sugars; on insluin now; improved. However, today- 267. Defer to PCP for control.  # Left Renal indterminate lesion- ~1cm CT- Sep 2016- Stable.Follow-up as planned with further scans in12 months.   #  patient will get CBC CMP/CEA 1 week before the next appointment/  follow-up in 6 months; CT scan prior. Discussed with the clinical trials nurse.

## 2017-01-15 NOTE — Progress Notes (Unsigned)
Patient, [name] [name], returned to clinic for 6 month follow up with Dr. Rogue Bussing today.  Patient reports experiencing fluid around his heart in mid February for which he followed up with his cardiologist, Dr. Raliegh Ip. Ubaldo Glassing.  States Dr. Ubaldo Glassing prescribed an extra fluid pill which has resolved the symptoms of dyspnea upon reclining.  Patient has no clinical symptoms of recurrent colon cancer at present time.  Will schedule CT scan and return appointment for 6 months.  Patient to have his next colonoscopy in August of 2019. Raynelle Dick, RN BSN "01/15/2017 11:13 AM"

## 2017-01-16 LAB — CEA: CEA: 1.2 ng/mL (ref 0.0–4.7)

## 2017-07-13 ENCOUNTER — Ambulatory Visit
Admission: RE | Admit: 2017-07-13 | Discharge: 2017-07-13 | Disposition: A | Discharge status: Home / Self Care | Payer: Commercial Managed Care - PPO | Source: Ambulatory Visit | Attending: Internal Medicine | Admitting: Internal Medicine

## 2017-07-13 DIAGNOSIS — C187 Malignant neoplasm of sigmoid colon: Secondary | ICD-10-CM | POA: Insufficient documentation

## 2017-07-13 DIAGNOSIS — N200 Calculus of kidney: Secondary | ICD-10-CM | POA: Insufficient documentation

## 2017-07-13 LAB — POCT I-STAT CREATININE: CREATININE: 1.4 mg/dL — AB (ref 0.61–1.24)

## 2017-07-13 MED ORDER — IOPAMIDOL (ISOVUE-300) INJECTION 61%
100.0000 mL | Freq: Once | INTRAVENOUS | Status: AC | PRN
  Administered 2017-07-13: 100 mL via INTRAVENOUS

## 2017-07-16 ENCOUNTER — Inpatient Hospital Stay: Payer: Commercial Managed Care - PPO

## 2017-07-16 ENCOUNTER — Encounter: Payer: Self-pay | Admitting: *Deleted

## 2017-07-16 ENCOUNTER — Inpatient Hospital Stay: Payer: Commercial Managed Care - PPO | Attending: Internal Medicine | Admitting: Internal Medicine

## 2017-07-16 VITALS — BP 103/79 | HR 77 | Temp 97.8°F | Resp 20 | Ht 69.0 in | Wt 227.0 lb

## 2017-07-16 DIAGNOSIS — Z79899 Other long term (current) drug therapy: Secondary | ICD-10-CM | POA: Diagnosis not present

## 2017-07-16 DIAGNOSIS — C187 Malignant neoplasm of sigmoid colon: Secondary | ICD-10-CM

## 2017-07-16 DIAGNOSIS — Z794 Long term (current) use of insulin: Secondary | ICD-10-CM | POA: Insufficient documentation

## 2017-07-16 DIAGNOSIS — E785 Hyperlipidemia, unspecified: Secondary | ICD-10-CM | POA: Diagnosis not present

## 2017-07-16 DIAGNOSIS — I110 Hypertensive heart disease with heart failure: Secondary | ICD-10-CM | POA: Insufficient documentation

## 2017-07-16 DIAGNOSIS — I429 Cardiomyopathy, unspecified: Secondary | ICD-10-CM | POA: Diagnosis not present

## 2017-07-16 DIAGNOSIS — Z85038 Personal history of other malignant neoplasm of large intestine: Secondary | ICD-10-CM | POA: Insufficient documentation

## 2017-07-16 DIAGNOSIS — I509 Heart failure, unspecified: Secondary | ICD-10-CM | POA: Insufficient documentation

## 2017-07-16 DIAGNOSIS — E119 Type 2 diabetes mellitus without complications: Secondary | ICD-10-CM | POA: Diagnosis not present

## 2017-07-16 DIAGNOSIS — Z9049 Acquired absence of other specified parts of digestive tract: Secondary | ICD-10-CM | POA: Diagnosis not present

## 2017-07-16 LAB — CBC WITH DIFFERENTIAL/PLATELET
BASOS ABS: 0.1 10*3/uL (ref 0–0.1)
BASOS PCT: 1 %
EOS ABS: 0.6 10*3/uL (ref 0–0.7)
Eosinophils Relative: 6 %
HCT: 44.6 % (ref 40.0–52.0)
Hemoglobin: 15.5 g/dL (ref 13.0–18.0)
Lymphocytes Relative: 26 %
Lymphs Abs: 2.9 10*3/uL (ref 1.0–3.6)
MCH: 31.6 pg (ref 26.0–34.0)
MCHC: 34.7 g/dL (ref 32.0–36.0)
MCV: 91.2 fL (ref 80.0–100.0)
MONO ABS: 0.9 10*3/uL (ref 0.2–1.0)
MONOS PCT: 8 %
NEUTROS ABS: 6.7 10*3/uL — AB (ref 1.4–6.5)
Neutrophils Relative %: 59 %
PLATELETS: 218 10*3/uL (ref 150–440)
RBC: 4.89 MIL/uL (ref 4.40–5.90)
RDW: 13.1 % (ref 11.5–14.5)
WBC: 11.2 10*3/uL — ABNORMAL HIGH (ref 3.8–10.6)

## 2017-07-16 LAB — COMPREHENSIVE METABOLIC PANEL
ALBUMIN: 4.4 g/dL (ref 3.5–5.0)
ALT: 47 U/L (ref 17–63)
ANION GAP: 9 (ref 5–15)
AST: 39 U/L (ref 15–41)
Alkaline Phosphatase: 51 U/L (ref 38–126)
BILIRUBIN TOTAL: 1.1 mg/dL (ref 0.3–1.2)
BUN: 11 mg/dL (ref 6–20)
CALCIUM: 9.5 mg/dL (ref 8.9–10.3)
CO2: 28 mmol/L (ref 22–32)
Chloride: 98 mmol/L — ABNORMAL LOW (ref 101–111)
Creatinine, Ser: 1.06 mg/dL (ref 0.61–1.24)
GLUCOSE: 226 mg/dL — AB (ref 65–99)
POTASSIUM: 4.1 mmol/L (ref 3.5–5.1)
Sodium: 135 mmol/L (ref 135–145)
TOTAL PROTEIN: 8 g/dL (ref 6.5–8.1)

## 2017-07-16 NOTE — Assessment & Plan Note (Addendum)
#  COLON CA stage III-on CALGB 00712. Clinically- NED. Oct 2018- CT chest/ab/Pelvis- CT- NED.  Last colo- oct 2017 again recommended in 2 years.  # Intolerance to celecoxib/placebo discontinued in Oct 2016 sec to elevated blood sugars; on insluin now; improved. However, today- 226. Follows with Dr.Solum.   # Left Renal indterminate lesion/likley complex cyst- ~1cm CT- Sep 2018- Stable; monitor for now.   # Screening for family- discussed with patient that his first-degree relatives should be screened for colon cancer starting age of 50 [10 years younger from his age of diagnosis]. Check MMR on tumor sample.   #  patient will get CBC CMP/CEA - in 6 months. Discussed with clinical trial RN.   # I reviewed the blood work- with the patient in detail; also reviewed the imaging independently [as summarized above]; and with the patient in detail.

## 2017-07-16 NOTE — Progress Notes (Signed)
[facility] OFFICE PROGRESS NOTE  Patient Care Team: [place] Pink, MD as PCP - General (Family Medicine)   SUMMARY OF ONCOLOGIC HISTORY:  Oncology History   # AUG 2014- COLO CANCER [s/p sigmoid colectomy] STAGE III [pT1a (adeno ca in large tubulovillous adenoma- invades sub-mucosa)pN1(1/4 Ln pos)]; well diff low grade; margins- neg; on CALGB 40814 [FOLFOX x6 treatments- Oct 2014-Dec 2014]- Study drug x 3 years [Oct 2018] placebo Vs.celexocib. Aug 2015- Colo [Dr.Elliot] Tubular adenoma x 2 ; CT march 2017- NED; CT OCT 2017- NED; colo- oct 2017 [repeat in 2019]  # LEFT KIDNEY cysts/ exophytic lesions <cm- STABLE; cont f/u on CT scan.   # Hx defib; DM  # OCT 2018- check MMR     Cancer of sigmoid colon ([place])       INTERVAL HISTORY:  A very pleasant 61 -year-old male patient with above history of colon cancer stage III on protocol CALGB 48185- is here for follow-up.   Patient states his blood sugars are still on the higher side. Is currently on insulin. He follows up with endocrinology, Dr.Solum.   Otherwise his appetite is good. No nausea no vomiting. No abdominal pain. No blood in stools black stools. Denies any weight loss. Denies any swelling in legs.  REVIEW OF SYSTEMS:  A complete 10 point review of system is done which is negative except mentioned above/history of present illness.   PAST MEDICAL HISTORY :  Past Medical History:  Diagnosis Date  . Cardiac arrhythmia 06/22/2013  . Cardiomyopathy ([place]) 06/22/2013  . CHF (congestive heart failure) ([place])   . Colon cancer [facility]) 2014   Partial Colon Resection.  . Diabetes mellitus without complication ([place])    Patient takes Metformin  . Hyperlipidemia   . Hypertension   . Neck pain on right side 07/16/2015    PAST SURGICAL HISTORY :   Past Surgical History:  Procedure Laterality Date  . Biventricular implantable cardioverter-defbrillator Left 04/27/2014   Medtronic biventricular ICD model Consulta CRTD  [place], serial [id number]. The right atrial lead is Medtronic [id number], serial # [id number].  The right ventrcular lead is Medtronic [id number], serial # [id number] and the LV lead is 4196 Medtronic serial # [id number]  . BLADDER SURGERY N/A 06/22/2013   "bladder stretched"  . CARDIAC CATHETERIZATION    . CHOLECYSTECTOMY    . COLECTOMY Left 05/25/2013   sigmoid colectomy  . COLONOSCOPY WITH PROPOFOL N/A 08/03/2016   Procedure: COLONOSCOPY WITH PROPOFOL;  Surgeon: Manya Silvas, MD;  Location: [facility] ENDOSCOPY;  Service: Endoscopy;  Laterality: N/A;  . ROTATOR CUFF REPAIR Left 08/2003    FAMILY HISTORY :  Mom- breast/colon; mat- aunt- breast. No bladder /stomach cancer.  Family History  Problem Relation Age of Onset  . Cancer Mother   . Diabetes Father   . Stroke Father   . Sleep apnea Sister   . Cancer Maternal Aunt     SOCIAL HISTORY:   Social History  Substance Use Topics  . Smoking status: Never Smoker  . Smokeless tobacco: Never Used  . Alcohol use No    ALLERGIES:  is allergic to no known allergies.  MEDICATIONS:  Current Outpatient Prescriptions  Medication Sig Dispense Refill  . canagliflozin (INVOKANA) 300 MG TABS tablet Take 300 mg by mouth daily before breakfast.    . carvedilol (COREG) 3.125 MG tablet Take 3.125 mg by mouth 1 day or 1 dose.    . furosemide (LASIX) 20 MG tablet Take 20 mg by mouth daily as  needed.    Marland Kitchen glimepiride (AMARYL) 1 MG tablet Take 2 mg by mouth 2 (two) times daily. Take 2 tablets twice daily    . glucose blood (ONE TOUCH ULTRA TEST) test strip Use 2 (two) times daily. Use as instructed.    . Insulin Glargine (LANTUS) 100 UNIT/ML Solostar Pen Inject 28 Units into the skin 1 day or 1 dose. Once daily at bedtime     . meloxicam (MOBIC) 15 MG tablet Take 15 mg by mouth daily.    . metFORMIN (GLUCOPHAGE) 500 MG tablet Take 1,000 mg by mouth 2 (two) times daily with a meal.    . olmesartan-hydrochlorothiazide (BENICAR HCT) 20-12.5 MG per tablet Take  0.5 tablets by mouth daily.    . simvastatin (ZOCOR) 40 MG tablet Take 40 mg by mouth daily. Once daily at bedtime    . sitaGLIPtin (JANUVIA) 100 MG tablet Take 100 mg by mouth 1 day or 1 dose. In the morning     No current facility-administered medications for this visit.     PHYSICAL EXAMINATION: ECOG PERFORMANCE STATUS: 0 - Asymptomatic  BP 103/79 (BP Location: Left Arm, Patient Position: Sitting)   Pulse 77   Temp 97.8 F (36.6 C) (Tympanic)   Resp 20   Ht 5' 9"  (1.753 m)   Wt 227 lb (103 kg)   BMI 33.52 kg/m   Filed Weights   07/16/17 1404  Weight: 227 lb (103 kg)    GENERAL: Well-nourished well-developed; Alert, no distress and comfortable.  Alone. EYES: no pallor or icterus OROPHARYNX: no thrush or ulceration; good dentition  NECK: supple, no masses felt LYMPH:  no palpable lymphadenopathy in the cervical, axillary or inguinal regions LUNGS: clear to auscultation and  No wheeze or crackles HEART/CVS: regular rate & rhythm and no murmurs; No lower extremity edema ABDOMEN:abdomen soft, non-tender and normal bowel sounds Musculoskeletal:no cyanosis of digits and no clubbing; PSYCH: alert & oriented x 3 with fluent speech NEURO: no focal motor/sensory deficits SKIN:  no rashes or significant lesions  LABORATORY DATA:  I have reviewed the data as listed    Component Value Date/Time   NA 135 07/16/2017 1337   NA 132 (L) 01/07/2015 1052   K 4.1 07/16/2017 1337   K 4.3 01/07/2015 1052   CL 98 (L) 07/16/2017 1337   CL 101 01/07/2015 1052   CO2 28 07/16/2017 1337   CO2 23 01/07/2015 1052   GLUCOSE 226 (H) 07/16/2017 1337   GLUCOSE 404 (H) 01/07/2015 1052   BUN 11 07/16/2017 1337   BUN 14 01/07/2015 1052   CREATININE 1.06 07/16/2017 1337   CREATININE 0.99 01/07/2015 1052   CALCIUM 9.5 07/16/2017 1337   CALCIUM 9.2 01/07/2015 1052   PROT 8.0 07/16/2017 1337   PROT 7.7 01/07/2015 1052   ALBUMIN 4.4 07/16/2017 1337   ALBUMIN 4.3 01/07/2015 1052   AST 39  07/16/2017 1337   AST 42 (H) 01/07/2015 1052   ALT 47 07/16/2017 1337   ALT 51 01/07/2015 1052   ALKPHOS 51 07/16/2017 1337   ALKPHOS 64 01/07/2015 1052   BILITOT 1.1 07/16/2017 1337   BILITOT 1.0 01/07/2015 1052   GFRNONAA >60 07/16/2017 1337   GFRNONAA >60 01/07/2015 1052   GFRAA >60 07/16/2017 1337   GFRAA >60 01/07/2015 1052    No results found for: SPEP, UPEP  Lab Results  Component Value Date   WBC 11.2 (H) 07/16/2017   NEUTROABS 6.7 (H) 07/16/2017   HGB 15.5 07/16/2017   HCT 44.6  07/16/2017   MCV 91.2 07/16/2017   PLT 218 07/16/2017      Chemistry      Component Value Date/Time   NA 135 07/16/2017 1337   NA 132 (L) 01/07/2015 1052   K 4.1 07/16/2017 1337   K 4.3 01/07/2015 1052   CL 98 (L) 07/16/2017 1337   CL 101 01/07/2015 1052   CO2 28 07/16/2017 1337   CO2 23 01/07/2015 1052   BUN 11 07/16/2017 1337   BUN 14 01/07/2015 1052   CREATININE 1.06 07/16/2017 1337   CREATININE 0.99 01/07/2015 1052      Component Value Date/Time   CALCIUM 9.5 07/16/2017 1337   CALCIUM 9.2 01/07/2015 1052   ALKPHOS 51 07/16/2017 1337   ALKPHOS 64 01/07/2015 1052   AST 39 07/16/2017 1337   AST 42 (H) 01/07/2015 1052   ALT 47 07/16/2017 1337   ALT 51 01/07/2015 1052   BILITOT 1.1 07/16/2017 1337   BILITOT 1.0 01/07/2015 1052         ASSESSMENT & PLAN:   Cancer of sigmoid colon ([place]) # COLON CA stage III-on CALGB 20947. Clinically- NED. Oct 2018- CT chest/ab/Pelvis- CT- NED.  Last colo- oct 2017 again recommended in 2 years.  # Intolerance to celecoxib/placebo discontinued in Oct 2016 sec to elevated blood sugars; on insluin now; improved. However, today- 226. Follows with Dr.Solum.   # Left Renal indterminate lesion/likley complex cyst- ~1cm CT- Sep 2018- Stable; monitor for now.   # Screening for family- discussed with patient that his first-degree relatives should be screened for colon cancer starting age of 49 [10 years younger from his age of diagnosis]. Check  MMR on tumor sample.   #  patient will get CBC CMP/CEA - in 6 months. Discussed with clinical trial RN.   # I reviewed the blood work- with the patient in detail; also reviewed the imaging independently [as summarized above]; and with the patient in detail.   # 25 minutes face-to-face with the patient discussing the above plan of care; more than 50% of time spent on prognosis/ natural history; counseling and coordination.    Cammie Sickle, MD 07/16/2017 2:53 PM

## 2017-07-16 NOTE — Progress Notes (Signed)
[name], returned to clinic for 6 month follow up with Dr. Rogue Bussing today.  Patient had a CT scan performed on 07/13/17 and results were negative, and were reviewed with him by Dr. Rogue Bussing. Patient continues to be followed regularly by cardiologist, Dr. Raliegh Ip. Fath for chronic CHF.  Continues to experience some shortness of breath with exertion, and per Dr. Bethanne Ginger note, patient's LVEF is about 25% now and he continues to have an implanted defibillator.  Patient has no clinical symptoms of recurrent colon cancer at present time and denies any new problems.  Blood glucose level remains elevated at 226mg /dL - but this is not a fasting value. Patient to have his next colonoscopy in August of 2019, and next CT scan in one year. Will schedule return appointment for 6 months. Patient requests a reminder call.  [name], BSN, MHA, OCN 07/16/2017 2:49 PM  CEA result is 1.4ng/mL. [name], BSN, MHA, OCN 07/19/2017 10:19 AM

## 2017-07-16 NOTE — Progress Notes (Signed)
Patient here for follow-up for h/o colon cancer. He has no medical complaints.

## 2017-07-17 LAB — CEA: CEA: 1.4 ng/mL (ref 0.0–4.7)

## 2017-07-23 ENCOUNTER — Other Ambulatory Visit: Payer: Commercial Managed Care - PPO

## 2017-07-23 ENCOUNTER — Ambulatory Visit: Payer: Commercial Managed Care - PPO | Admitting: Internal Medicine

## 2018-01-14 ENCOUNTER — Encounter: Payer: Self-pay | Admitting: Internal Medicine

## 2018-01-14 ENCOUNTER — Other Ambulatory Visit: Payer: Self-pay

## 2018-01-14 ENCOUNTER — Inpatient Hospital Stay (HOSPITAL_BASED_OUTPATIENT_CLINIC_OR_DEPARTMENT_OTHER): Payer: Commercial Managed Care - PPO | Admitting: Internal Medicine

## 2018-01-14 ENCOUNTER — Encounter: Payer: Self-pay | Admitting: *Deleted

## 2018-01-14 ENCOUNTER — Inpatient Hospital Stay: Payer: Commercial Managed Care - PPO | Attending: Internal Medicine

## 2018-01-14 VITALS — BP 107/77 | HR 108 | Temp 97.9°F | Resp 20 | Ht 69.0 in | Wt 228.0 lb

## 2018-01-14 DIAGNOSIS — I10 Essential (primary) hypertension: Secondary | ICD-10-CM | POA: Insufficient documentation

## 2018-01-14 DIAGNOSIS — Z794 Long term (current) use of insulin: Secondary | ICD-10-CM | POA: Diagnosis not present

## 2018-01-14 DIAGNOSIS — C187 Malignant neoplasm of sigmoid colon: Secondary | ICD-10-CM

## 2018-01-14 DIAGNOSIS — Z85038 Personal history of other malignant neoplasm of large intestine: Secondary | ICD-10-CM

## 2018-01-14 DIAGNOSIS — E119 Type 2 diabetes mellitus without complications: Secondary | ICD-10-CM | POA: Diagnosis not present

## 2018-01-14 LAB — COMPREHENSIVE METABOLIC PANEL
ALBUMIN: 3.9 g/dL (ref 3.5–5.0)
ALT: 39 U/L (ref 17–63)
ANION GAP: 8 (ref 5–15)
AST: 36 U/L (ref 15–41)
Alkaline Phosphatase: 43 U/L (ref 38–126)
BUN: 18 mg/dL (ref 6–20)
CO2: 20 mmol/L — ABNORMAL LOW (ref 22–32)
Calcium: 9.7 mg/dL (ref 8.9–10.3)
Chloride: 106 mmol/L (ref 101–111)
Creatinine, Ser: 1.11 mg/dL (ref 0.61–1.24)
GLUCOSE: 337 mg/dL — AB (ref 65–99)
POTASSIUM: 4.1 mmol/L (ref 3.5–5.1)
Sodium: 134 mmol/L — ABNORMAL LOW (ref 135–145)
TOTAL PROTEIN: 7.2 g/dL (ref 6.5–8.1)
Total Bilirubin: 1.1 mg/dL (ref 0.3–1.2)

## 2018-01-14 LAB — CBC WITH DIFFERENTIAL/PLATELET
BASOS ABS: 0.1 10*3/uL (ref 0–0.1)
BASOS PCT: 1 %
Eosinophils Absolute: 0.5 10*3/uL (ref 0–0.7)
Eosinophils Relative: 6 %
HEMATOCRIT: 41.4 % (ref 40.0–52.0)
HEMOGLOBIN: 14.3 g/dL (ref 13.0–18.0)
LYMPHS PCT: 34 %
Lymphs Abs: 3 10*3/uL (ref 1.0–3.6)
MCH: 31.4 pg (ref 26.0–34.0)
MCHC: 34.6 g/dL (ref 32.0–36.0)
MCV: 90.9 fL (ref 80.0–100.0)
Monocytes Absolute: 0.9 10*3/uL (ref 0.2–1.0)
Monocytes Relative: 11 %
NEUTROS ABS: 4.2 10*3/uL (ref 1.4–6.5)
NEUTROS PCT: 48 %
Platelets: 199 10*3/uL (ref 150–440)
RBC: 4.56 MIL/uL (ref 4.40–5.90)
RDW: 13.9 % (ref 11.5–14.5)
WBC: 8.7 10*3/uL (ref 3.8–10.6)

## 2018-01-14 NOTE — Progress Notes (Signed)
Patient here for colon cancer f/u. He has no medical complaints.

## 2018-01-14 NOTE — Progress Notes (Signed)
[name] [name] presented to clinic for 6 month follow up with Dr. Rogue [name] this afternoon.  Patient denies any new problems, but states his blood sugar is still running high. States he takes insulin injections twice daily and is checking his blood sugars as well. Fasting blood sugar was 230 this morning, but his blood glucose this afternoon is up to 342m/dL. States his PCP just increased his insulin from 35 to 45 units twice daily; however this is not reflected in patient's medication list. He reports eating pancakes for breakfast this morning and hotdogs and tater tots for supper last night. Counseled on avoiding high carbs and high fat in his diet, and substituting sugar free syrup if eating pancakes. Patient continues to be followed regularly by cardiologist, Dr. KRaliegh Ip [name] for chronic CHF and implanted defibillator.  Patient has no clinical symptoms of recurrent colon cancer at present time and Dr. BRogue Bussingdiscussed with him that he wants to order an MMR - molecular test on his tumor tissue since he has a family history of cancer. Per the CALGB 810175follow up schedule, Mr. GNicksonwill have annual CTs of chest/abdomen/pelvis, and this will be due again in October, 2019. He is also due for a follow up colonoscopy in 07/2018 per Dr. EVira [name] Patient will follow up with Dr. BRogue Bussingon 07/15/2018.  [name] [name] BSN, MHA, OCN 01/14/2018 2:36 PM

## 2018-01-14 NOTE — Progress Notes (Signed)
[place] OFFICE PROGRESS NOTE  Patient Care Team: [place] Pink, MD as PCP - General (Family Medicine)   SUMMARY OF ONCOLOGIC HISTORY:  Oncology History   # AUG 2014- COLO CANCER [s/p sigmoid colectomy] STAGE III [pT1a (adeno ca in large tubulovillous adenoma- invades sub-mucosa)pN1(1/4 Ln pos)]; well diff low grade; margins- neg; on CALGB 77412 [FOLFOX x6 treatments- Oct 2014-Dec 2014]- Study drug x 3 years [Oct 2018] placebo Vs.celexocib. Aug 2015- Colo [Dr.Elliot] Tubular adenoma x 2 ; CT march 2017- NED; CT OCT 2017- NED; colo- oct 2017 [repeat in 2019]  # LEFT KIDNEY cysts/ exophytic lesions <cm- STABLE; cont f/u on CT scan.   # Hx defib; DM  # OCT 2018- check MMR     Cancer of sigmoid colon ([place])       INTERVAL HISTORY:  A very pleasant 62 -year-old male patient with above history of colon cancer stage III on protocol CALGB 87867- is here for follow-up.  Patient abdominal pain nausea vomiting.  He denies any blood in stools or black stools.  Denies any swelling of the legs.  No weight loss.  Patient states his blood sugars are still on the higher side. Is currently on insulin. He follows up with endocrinology, Dr.Solum.   REVIEW OF SYSTEMS:  A complete 10 point review of system is done which is negative except mentioned above/history of present illness.   PAST MEDICAL HISTORY :  Past Medical History:  Diagnosis Date  . Cardiac arrhythmia 06/22/2013  . Cardiomyopathy ([place]) 06/22/2013  . CHF (congestive heart failure) ([place])   . Colon cancer [facility]) 2014   Partial Colon Resection.  . Diabetes mellitus without complication ([place])    Patient takes Metformin  . Hyperlipidemia   . Hypertension   . Neck pain on right side 07/16/2015    PAST SURGICAL HISTORY :   Past Surgical History:  Procedure Laterality Date  . Biventricular implantable cardioverter-defbrillator Left 04/27/2014   Medtronic biventricular ICD model Consulta CRTD [place], serial  [id number]. The right atrial lead is Medtronic [id number], serial # [id number].  The right ventrcular lead is Medtronic [id number], serial # [id number] and the LV lead is [id number] Medtronic serial # [id number]  . BLADDER SURGERY N/A 06/22/2013   "bladder stretched"  . CARDIAC CATHETERIZATION    . CHOLECYSTECTOMY    . COLECTOMY Left 05/25/2013   sigmoid colectomy  . COLONOSCOPY WITH PROPOFOL N/A 08/03/2016   Procedure: COLONOSCOPY WITH PROPOFOL;  Surgeon: Manya Silvas, MD;  Location: [facility] ENDOSCOPY;  Service: Endoscopy;  Laterality: N/A;  . ROTATOR CUFF REPAIR Left 08/2003    FAMILY HISTORY :  Mom- breast/colon; mat- aunt- breast. No bladder /stomach cancer.  Family History  Problem Relation Age of Onset  . Cancer Mother   . Diabetes Father   . Stroke Father   . Sleep apnea Sister   . Cancer Maternal Aunt     SOCIAL HISTORY:   Social History   Tobacco Use  . Smoking status: Never Smoker  . Smokeless tobacco: Never Used  Substance Use Topics  . Alcohol use: No  . Drug use: No    ALLERGIES:  is allergic to no known allergies.  MEDICATIONS:  Current Outpatient Medications  Medication Sig Dispense Refill  . canagliflozin (INVOKANA) 300 MG TABS tablet Take 300 mg by mouth daily before breakfast.    . carvedilol (COREG) 3.125 MG tablet Take 3.125 mg by mouth 1 day or 1 dose.    Marland Kitchen glimepiride (AMARYL) 1 MG tablet  Take 2 mg by mouth 2 (two) times daily. Take 2 tablets twice daily    . glucose blood (ONE TOUCH ULTRA TEST) test strip Use 2 (two) times daily. Use as instructed.    . Insulin Glargine (LANTUS) 100 UNIT/ML Solostar Pen Inject 28 Units into the skin 1 day or 1 dose. Once daily at bedtime     . meloxicam (MOBIC) 15 MG tablet Take 15 mg by mouth daily.    . metFORMIN (GLUCOPHAGE) 500 MG tablet Take 1,000 mg by mouth 2 (two) times daily with a meal.    . olmesartan-hydrochlorothiazide (BENICAR HCT) 20-12.5 MG per tablet Take 0.5 tablets by mouth daily.    . simvastatin (ZOCOR) 40 MG  tablet Take 40 mg by mouth daily. Once daily at bedtime    . sitaGLIPtin (JANUVIA) 100 MG tablet Take 100 mg by mouth 1 day or 1 dose. In the morning    . furosemide (LASIX) 20 MG tablet Take 20 mg by mouth daily as needed.     No current facility-administered medications for this visit.     PHYSICAL EXAMINATION: ECOG PERFORMANCE STATUS: 0 - Asymptomatic  BP 107/77 (Patient Position: Sitting)   Pulse (!) 108   Temp 97.9 F (36.6 C) (Tympanic)   Resp 20   Ht 5' 9"  (1.753 m)   Wt 228 lb (103.4 kg)   BMI 33.67 kg/m   Filed Weights   01/14/18 1357  Weight: 228 lb (103.4 kg)    GENERAL: Well-nourished well-developed; Alert, no distress and comfortable.  Alone. EYES: no pallor or icterus OROPHARYNX: no thrush or ulceration; good dentition  NECK: supple, no masses felt LYMPH:  no palpable lymphadenopathy in the cervical, axillary or inguinal regions LUNGS: clear to auscultation and  No wheeze or crackles HEART/CVS: regular rate & rhythm and no murmurs; No lower extremity edema ABDOMEN:abdomen soft, non-tender and normal bowel sounds Musculoskeletal:no cyanosis of digits and no clubbing; PSYCH: alert & oriented x 3 with fluent speech NEURO: no focal motor/sensory deficits SKIN:  no rashes or significant lesions  LABORATORY DATA:  I have reviewed the data as listed    Component Value Date/Time   NA 134 (L) 01/14/2018 1347   NA 132 (L) 01/07/2015 1052   K 4.1 01/14/2018 1347   K 4.3 01/07/2015 1052   CL 106 01/14/2018 1347   CL 101 01/07/2015 1052   CO2 20 (L) 01/14/2018 1347   CO2 23 01/07/2015 1052   GLUCOSE 337 (H) 01/14/2018 1347   GLUCOSE 404 (H) 01/07/2015 1052   BUN 18 01/14/2018 1347   BUN 14 01/07/2015 1052   CREATININE 1.11 01/14/2018 1347   CREATININE 0.99 01/07/2015 1052   CALCIUM 9.7 01/14/2018 1347   CALCIUM 9.2 01/07/2015 1052   PROT 7.2 01/14/2018 1347   PROT 7.7 01/07/2015 1052   ALBUMIN 3.9 01/14/2018 1347   ALBUMIN 4.3 01/07/2015 1052   AST 36  01/14/2018 1347   AST 42 (H) 01/07/2015 1052   ALT 39 01/14/2018 1347   ALT 51 01/07/2015 1052   ALKPHOS 43 01/14/2018 1347   ALKPHOS 64 01/07/2015 1052   BILITOT 1.1 01/14/2018 1347   BILITOT 1.0 01/07/2015 1052   GFRNONAA >60 01/14/2018 1347   GFRNONAA >60 01/07/2015 1052   GFRAA >60 01/14/2018 1347   GFRAA >60 01/07/2015 1052    No results found for: SPEP, UPEP  Lab Results  Component Value Date   WBC 8.7 01/14/2018   NEUTROABS 4.2 01/14/2018   HGB 14.3 01/14/2018  HCT 41.4 01/14/2018   MCV 90.9 01/14/2018   PLT 199 01/14/2018      Chemistry      Component Value Date/Time   NA 134 (L) 01/14/2018 1347   NA 132 (L) 01/07/2015 1052   K 4.1 01/14/2018 1347   K 4.3 01/07/2015 1052   CL 106 01/14/2018 1347   CL 101 01/07/2015 1052   CO2 20 (L) 01/14/2018 1347   CO2 23 01/07/2015 1052   BUN 18 01/14/2018 1347   BUN 14 01/07/2015 1052   CREATININE 1.11 01/14/2018 1347   CREATININE 0.99 01/07/2015 1052      Component Value Date/Time   CALCIUM 9.7 01/14/2018 1347   CALCIUM 9.2 01/07/2015 1052   ALKPHOS 43 01/14/2018 1347   ALKPHOS 64 01/07/2015 1052   AST 36 01/14/2018 1347   AST 42 (H) 01/07/2015 1052   ALT 39 01/14/2018 1347   ALT 51 01/07/2015 1052   BILITOT 1.1 01/14/2018 1347   BILITOT 1.0 01/07/2015 1052         ASSESSMENT & PLAN:   Cancer of sigmoid colon ([place]) # COLON CA stage III-on CALGB 51025 [ Intolerance to celecoxib/placebo discontinued in Oct 2016 sec to elevated blood sugars].   # Clinically- NED. Oct 2018- CT chest/ab/Pelvis- CT- NED.  As per the protocol we will get a CT scan in approximately 6 months from now.  Patient will need a CT scan again in 2020 fall/last scan.  Patient is again recommended surveillance colonoscopy-in October 2019.   #Poorly controlled diabetes-hemoglobin A1c march 2019- 10/ poorly controlled. Follows with Dr.Solum.   # Left Renal indterminate lesion/likley complex cyst- ~1cm CT- Sep 2018- Stable; monitor for  now.   # Screening for family- uncle-? Type of cancer ; mom died- colon-Dx-68/breast-60s; died of brain mets- at 28y]; discussed with patient that his first-degree relatives should be screened for colon cancer starting age of 68 [10 years younger from his age of diagnosis]. Check MMR on tumor sample.   #  patient will get CBC CMP/CEA - in 6 months/CT prior Discussed with clinical trial RN.   # 25 minutes face-to-face with the patient discussing the above plan of care; more than 50% of time spent on prognosis/ natural history; counseling and coordination.    Cammie Sickle, MD 01/15/2018 6:04 PM

## 2018-01-14 NOTE — Assessment & Plan Note (Addendum)
#  COLON CA stage III-on CALGB 56256 [ Intolerance to celecoxib/placebo discontinued in Oct 2016 sec to elevated blood sugars].   # Clinically- NED. Oct 2018- CT chest/ab/Pelvis- CT- NED.  As per the protocol we will get a CT scan in approximately 6 months from now.  Patient will need a CT scan again in 2020 fall/last scan.  Patient is again recommended surveillance colonoscopy-in October 2019.   #Poorly controlled diabetes-hemoglobin A1c march 2019- 10/ poorly controlled. Follows with Dr.Solum.   # Left Renal indterminate lesion/likley complex cyst- ~1cm CT- Sep 2018- Stable; monitor for now.   # Screening for family- uncle-? Type of cancer ; mom died- colon-Dx-68/breast-60s; died of brain mets- at 62y]; discussed with patient that his first-degree relatives should be screened for colon cancer starting age of 70 [10 years younger from his age of diagnosis]. Check MMR on tumor sample.   #  patient will get CBC CMP/CEA - in 6 months/CT prior Discussed with clinical trial RN.

## 2018-01-15 LAB — CEA: CEA: 1.2 ng/mL (ref 0.0–4.7)

## 2018-07-15 ENCOUNTER — Other Ambulatory Visit: Payer: Self-pay

## 2018-07-15 ENCOUNTER — Inpatient Hospital Stay (HOSPITAL_BASED_OUTPATIENT_CLINIC_OR_DEPARTMENT_OTHER): Payer: Commercial Managed Care - PPO | Admitting: Internal Medicine

## 2018-07-15 ENCOUNTER — Ambulatory Visit
Admission: RE | Admit: 2018-07-15 | Discharge: 2018-07-15 | Disposition: A | Discharge status: Home / Self Care | Payer: Commercial Managed Care - PPO | Source: Ambulatory Visit | Attending: Internal Medicine | Admitting: Internal Medicine

## 2018-07-15 ENCOUNTER — Inpatient Hospital Stay: Payer: Commercial Managed Care - PPO | Attending: Internal Medicine

## 2018-07-15 ENCOUNTER — Telehealth: Payer: Self-pay | Admitting: Internal Medicine

## 2018-07-15 ENCOUNTER — Encounter: Payer: Self-pay | Admitting: Internal Medicine

## 2018-07-15 VITALS — BP 129/76 | HR 82 | Temp 97.6°F | Resp 20 | Ht 69.0 in | Wt 227.0 lb

## 2018-07-15 DIAGNOSIS — N289 Disorder of kidney and ureter, unspecified: Secondary | ICD-10-CM | POA: Insufficient documentation

## 2018-07-15 DIAGNOSIS — G629 Polyneuropathy, unspecified: Secondary | ICD-10-CM | POA: Insufficient documentation

## 2018-07-15 DIAGNOSIS — N200 Calculus of kidney: Secondary | ICD-10-CM | POA: Insufficient documentation

## 2018-07-15 DIAGNOSIS — C187 Malignant neoplasm of sigmoid colon: Secondary | ICD-10-CM

## 2018-07-15 DIAGNOSIS — I10 Essential (primary) hypertension: Secondary | ICD-10-CM | POA: Insufficient documentation

## 2018-07-15 DIAGNOSIS — E119 Type 2 diabetes mellitus without complications: Secondary | ICD-10-CM

## 2018-07-15 DIAGNOSIS — Z9049 Acquired absence of other specified parts of digestive tract: Secondary | ICD-10-CM | POA: Diagnosis not present

## 2018-07-15 DIAGNOSIS — Z79899 Other long term (current) drug therapy: Secondary | ICD-10-CM | POA: Insufficient documentation

## 2018-07-15 DIAGNOSIS — Z9221 Personal history of antineoplastic chemotherapy: Secondary | ICD-10-CM

## 2018-07-15 DIAGNOSIS — R911 Solitary pulmonary nodule: Secondary | ICD-10-CM | POA: Insufficient documentation

## 2018-07-15 DIAGNOSIS — Z7984 Long term (current) use of oral hypoglycemic drugs: Secondary | ICD-10-CM | POA: Insufficient documentation

## 2018-07-15 LAB — CBC WITH DIFFERENTIAL/PLATELET
Abs Immature Granulocytes: 0.03 10*3/uL (ref 0.00–0.07)
BASOS ABS: 0.1 10*3/uL (ref 0.0–0.1)
Basophils Relative: 1 %
EOS ABS: 0.6 10*3/uL — AB (ref 0.0–0.5)
Eosinophils Relative: 5 %
HCT: 42.2 % (ref 39.0–52.0)
Hemoglobin: 14.2 g/dL (ref 13.0–17.0)
IMMATURE GRANULOCYTES: 0 %
LYMPHS ABS: 2.7 10*3/uL (ref 0.7–4.0)
Lymphocytes Relative: 24 %
MCH: 30.8 pg (ref 26.0–34.0)
MCHC: 33.6 g/dL (ref 30.0–36.0)
MCV: 91.5 fL (ref 80.0–100.0)
Monocytes Absolute: 0.8 10*3/uL (ref 0.1–1.0)
Monocytes Relative: 7 %
NEUTROS ABS: 7 10*3/uL (ref 1.7–7.7)
NEUTROS PCT: 63 %
Platelets: 198 10*3/uL (ref 150–400)
RBC: 4.61 MIL/uL (ref 4.22–5.81)
RDW: 12.2 % (ref 11.5–15.5)
WBC: 11.2 10*3/uL — ABNORMAL HIGH (ref 4.0–10.5)
nRBC: 0 % (ref 0.0–0.2)

## 2018-07-15 LAB — COMPREHENSIVE METABOLIC PANEL
ALT: 32 U/L (ref 0–44)
AST: 29 U/L (ref 15–41)
Albumin: 4.3 g/dL (ref 3.5–5.0)
Alkaline Phosphatase: 39 U/L (ref 38–126)
Anion gap: 8 (ref 5–15)
BUN: 19 mg/dL (ref 8–23)
CHLORIDE: 103 mmol/L (ref 98–111)
CO2: 27 mmol/L (ref 22–32)
CREATININE: 1.04 mg/dL (ref 0.61–1.24)
Calcium: 10.3 mg/dL (ref 8.9–10.3)
GFR calc Af Amer: >60 mL/min (ref >60)
GFR calc non Af Amer: >60 mL/min (ref >60)
GLUCOSE: 179 mg/dL — AB (ref 70–99)
Potassium: 5 mmol/L (ref 3.5–5.1)
Sodium: 138 mmol/L (ref 135–145)
Total Bilirubin: 1 mg/dL (ref 0.3–1.2)
Total Protein: 7.7 g/dL (ref 6.5–8.1)

## 2018-07-15 LAB — POCT I-STAT CREATININE: CREATININE: 1.2 mg/dL (ref 0.61–1.24)

## 2018-07-15 MED ORDER — IOPAMIDOL (ISOVUE-300) INJECTION 61%
100.0000 mL | Freq: Once | INTRAVENOUS | Status: AC | PRN
  Administered 2018-07-15: 100 mL via INTRAVENOUS

## 2018-07-15 NOTE — Telephone Encounter (Signed)
Heather/Brooke please inform patient that CT scan is stable/no evidence of any cancer.  Follow-up as planned.  No new recommendations.

## 2018-07-15 NOTE — Telephone Encounter (Signed)
Patient notified of these results and verbalized understanding.  

## 2018-07-15 NOTE — Progress Notes (Signed)
[place] OFFICE PROGRESS NOTE  Patient Care Team: [place] Pink, MD as PCP - General (Family Medicine)   SUMMARY OF ONCOLOGIC HISTORY:  Oncology History   # AUG 2014- COLO CANCER [s/p sigmoid colectomy] STAGE III [pT1a (adeno ca in large tubulovillous adenoma- invades sub-mucosa)pN1(1/4 Ln pos)]; well diff low grade; margins- neg; on CALGB 74944 [FOLFOX x6 treatments- Oct 2014-Dec 2014]- Study drug x 3 years [Oct 2018] placebo Vs.celexocib. Aug 2015- Colo [Dr.Elliot] Tubular adenoma x 2 ; CT march 2017- NED; CT OCT 2017- NED; colo- oct 2017 [repeat in 2019]  # LEFT KIDNEY cysts/ exophytic lesions <cm- STABLE; cont f/u on CT scan.   # Hx defib; DM  # OCT 2018- MMR-STABLE.   DIAGNOSIS: COLON CANCER  STAGE:   III      ;GOALS: cure  CURRENT/MOST RECENT THERAPY: surveillance      Cancer of sigmoid colon ([place])       INTERVAL HISTORY:  A very pleasant 62 -year-old male patient with above history of colon cancer stage III on protocol CALGB 96759- is here for follow-up/results of the CT scan.  Patient denies any nausea vomiting abdominal pain.  He is awaiting to have a colonoscopy again in December 2019.  No blood in stools black loose stools.  States his blood sugars are under not the best control.  However follows up with endocrinology.  Patient abdominal pain nausea vomiting.  He denies any blood in stools or black stools.  Denies any swelling of the legs.  No weight loss.  Review of Systems  Constitutional: Negative for chills, diaphoresis, fever, malaise/fatigue and weight loss.  HENT: Negative for nosebleeds and sore throat.   Eyes: Negative for double vision.  Respiratory: Negative for cough, hemoptysis, sputum production, shortness of breath and wheezing.   Cardiovascular: Negative for chest pain, palpitations, orthopnea and leg swelling.  Gastrointestinal: Negative for abdominal pain, blood in stool, constipation, diarrhea, heartburn, melena, nausea  and vomiting.  Genitourinary: Negative for dysuria, frequency and urgency.  Musculoskeletal: Negative for back pain and joint pain.  Skin: Negative.  Negative for itching and rash.  Neurological: Positive for tingling. Negative for dizziness, focal weakness, weakness and headaches.  Endo/Heme/Allergies: Does not bruise/bleed easily.  Psychiatric/Behavioral: Negative for depression. The patient is not nervous/anxious and does not have insomnia.      REVIEW OF SYSTEMS:  A complete 10 point review of system is done which is negative except mentioned above/history of present illness.   PAST MEDICAL HISTORY :  Past Medical History:  Diagnosis Date  . Cardiac arrhythmia 06/22/2013  . Cardiomyopathy ([place]) 06/22/2013  . CHF (congestive heart failure) ([place])   . Colon cancer [facility]) 2014   Partial Colon Resection.  . Diabetes mellitus without complication ([place])    Patient takes Metformin  . Hyperlipidemia   . Hypertension   . Neck pain on right side 07/16/2015    PAST SURGICAL HISTORY :   Past Surgical History:  Procedure Laterality Date  . Biventricular implantable cardioverter-defbrillator Left 04/27/2014   Medtronic biventricular ICD model Consulta CRTD [place], serial [id number]. The right atrial lead is Medtronic [id number], serial # [id number].  The right ventrcular lead is Medtronic [id number], serial # [id number] and the LV lead is 4196 Medtronic serial # [id number]  . BLADDER SURGERY N/A 06/22/2013   "bladder stretched"  . CARDIAC CATHETERIZATION    . CHOLECYSTECTOMY    . COLECTOMY Left 05/25/2013   sigmoid colectomy  . COLONOSCOPY WITH PROPOFOL N/A 08/03/2016   Procedure:  COLONOSCOPY WITH PROPOFOL;  Surgeon: Manya Silvas, MD;  Location: [facility] ENDOSCOPY;  Service: Endoscopy;  Laterality: N/A;  . ROTATOR CUFF REPAIR Left 08/2003    FAMILY HISTORY :  Mom- breast/colon; mat- aunt- breast. No bladder /stomach cancer.  Family History  Problem Relation Age of Onset  . Cancer Mother   .  Diabetes Father   . Stroke Father   . Sleep apnea Sister   . Cancer Maternal Aunt     SOCIAL HISTORY:   Social History   Tobacco Use  . Smoking status: Never Smoker  . Smokeless tobacco: Never Used  Substance Use Topics  . Alcohol use: No  . Drug use: No    ALLERGIES:  is allergic to no known allergies.  MEDICATIONS:  Current Outpatient Medications  Medication Sig Dispense Refill  . canagliflozin (INVOKANA) 300 MG TABS tablet Take 300 mg by mouth daily before breakfast.    . carvedilol (COREG) 3.125 MG tablet Take 3.125 mg by mouth 1 day or 1 dose.    . furosemide (LASIX) 20 MG tablet Take 10 mg by mouth daily as needed.     Marland Kitchen glimepiride (AMARYL) 1 MG tablet Take 2 mg by mouth 2 (two) times daily. Take 2 tablets twice daily    . glucose blood (ONE TOUCH ULTRA TEST) test strip Use 2 (two) times daily. Use as instructed.    . insulin aspart protamine - aspart (NOVOLOG 70/30 MIX) (70-30) 100 UNIT/ML FlexPen Take 45 units before breakfast. Take 55 units before supper. Always take BEFORE the meals.    . meloxicam (MOBIC) 15 MG tablet Take 15 mg by mouth daily.    . metFORMIN (GLUCOPHAGE) 500 MG tablet Take 1,000 mg by mouth 2 (two) times daily with a meal.    . olmesartan-hydrochlorothiazide (BENICAR HCT) 20-12.5 MG per tablet Take 0.5 tablets by mouth daily.    . simvastatin (ZOCOR) 40 MG tablet Take 40 mg by mouth daily. Once daily at bedtime    . sitaGLIPtin (JANUVIA) 100 MG tablet Take 100 mg by mouth 1 day or 1 dose. In the morning     No current facility-administered medications for this visit.     PHYSICAL EXAMINATION: ECOG PERFORMANCE STATUS: 0 - Asymptomatic  BP 129/76 (BP Location: Left Arm, Patient Position: Sitting)   Pulse 82   Temp 97.6 F (36.4 C) (Tympanic)   Resp 20   Ht 5' 9"  (1.753 m)   Wt 227 lb (103 kg)   BMI 33.52 kg/m   Filed Weights   07/15/18 0950  Weight: 227 lb (103 kg)    Physical Exam  Constitutional: He is oriented to person, place,  and time and well-developed, well-nourished, and in no distress.  HENT:  Head: Normocephalic and atraumatic.  Mouth/Throat: Oropharynx is clear and moist. No oropharyngeal exudate.  Eyes: Pupils are equal, round, and reactive to light.  Neck: Normal range of motion. Neck supple.  Cardiovascular: Normal rate and regular rhythm.  Pulmonary/Chest: No respiratory distress. He has no wheezes.  Abdominal: Soft. Bowel sounds are normal. He exhibits no distension and no mass. There is no tenderness. There is no rebound and no guarding.  Musculoskeletal: Normal range of motion. He exhibits no edema or tenderness.  Neurological: He is alert and oriented to person, place, and time.  Skin: Skin is warm.  Psychiatric: Affect normal.    LABORATORY DATA:  I have reviewed the data as listed    Component Value Date/Time   NA 138 07/15/2018 0933  NA 132 (L) 01/07/2015 1052   K 5.0 07/15/2018 0933   K 4.3 01/07/2015 1052   CL 103 07/15/2018 0933   CL 101 01/07/2015 1052   CO2 27 07/15/2018 0933   CO2 23 01/07/2015 1052   GLUCOSE 179 (H) 07/15/2018 0933   GLUCOSE 404 (H) 01/07/2015 1052   BUN 19 07/15/2018 0933   BUN 14 01/07/2015 1052   CREATININE 1.04 07/15/2018 0933   CREATININE 0.99 01/07/2015 1052   CALCIUM 10.3 07/15/2018 0933   CALCIUM 9.2 01/07/2015 1052   PROT 7.7 07/15/2018 0933   PROT 7.7 01/07/2015 1052   ALBUMIN 4.3 07/15/2018 0933   ALBUMIN 4.3 01/07/2015 1052   AST 29 07/15/2018 0933   AST 42 (H) 01/07/2015 1052   ALT 32 07/15/2018 0933   ALT 51 01/07/2015 1052   ALKPHOS 39 07/15/2018 0933   ALKPHOS 64 01/07/2015 1052   BILITOT 1.0 07/15/2018 0933   BILITOT 1.0 01/07/2015 1052   GFRNONAA >60 07/15/2018 0933   GFRNONAA >60 01/07/2015 1052   GFRAA >60 07/15/2018 0933   GFRAA >60 01/07/2015 1052    No results found for: SPEP, UPEP  Lab Results  Component Value Date   WBC 11.2 (H) 07/15/2018   NEUTROABS 7.0 07/15/2018   HGB 14.2 07/15/2018   HCT 42.2 07/15/2018    MCV 91.5 07/15/2018   PLT 198 07/15/2018      Chemistry      Component Value Date/Time   NA 138 07/15/2018 0933   NA 132 (L) 01/07/2015 1052   K 5.0 07/15/2018 0933   K 4.3 01/07/2015 1052   CL 103 07/15/2018 0933   CL 101 01/07/2015 1052   CO2 27 07/15/2018 0933   CO2 23 01/07/2015 1052   BUN 19 07/15/2018 0933   BUN 14 01/07/2015 1052   CREATININE 1.04 07/15/2018 0933   CREATININE 0.99 01/07/2015 1052      Component Value Date/Time   CALCIUM 10.3 07/15/2018 0933   CALCIUM 9.2 01/07/2015 1052   ALKPHOS 39 07/15/2018 0933   ALKPHOS 64 01/07/2015 1052   AST 29 07/15/2018 0933   AST 42 (H) 01/07/2015 1052   ALT 32 07/15/2018 0933   ALT 51 01/07/2015 1052   BILITOT 1.0 07/15/2018 0933   BILITOT 1.0 01/07/2015 1052         ASSESSMENT & PLAN:   Cancer of sigmoid colon ([place]) # COLON CA stage III-on CALGB 17915 [ Intolerance to celecoxib/placebo discontinued in Oct 2016 sec to elevated blood sugars].  Stable.  # Clinically- NED. OCT 2019- done this AM-pending results. Patient will need a CT scan again in 2020 fall/last scan.  Awaiting surveillance colonoscopy-in dec 2019.   #Poorly controlled diabetes-hemoglobin A1c march 2019- 10/ poorly controlled. Awaiting labs from today.   # Left Renal indterminate lesion/likley complex cyst- ~1cm CT- Sep 2018- Stable; monitor for now.   #Peripheral neuropathy grade 1-stable.  #  patient will get CBC CMP/CEA - in 6 months; will call at [phone].  Addendum: Reviewed the results of the CT scan done early in the morning-no evidence of any recurrent metastatic disease.  Stable kidney cyst likely benign.  Patient will be informed.  # 25 minutes face-to-face with the patient discussing the above plan of care; more than 50% of time spent on prognosis/ natural history; counseling and coordination.    Cammie Sickle, MD 07/15/2018 1:30 PM

## 2018-07-15 NOTE — Assessment & Plan Note (Addendum)
#   COLON CA stage III-on CALGB 38937 [ Intolerance to celecoxib/placebo discontinued in Oct 2016 sec to elevated blood sugars].  Stable.  # Clinically- NED. OCT 2019- done this AM-pending results. Patient will need a CT scan again in 2020 fall/last scan.  Awaiting surveillance colonoscopy-in dec 2019.   #Poorly controlled diabetes-hemoglobin A1c march 2019- 10/ poorly controlled. Awaiting labs from today.   # Left Renal indterminate lesion/likley complex cyst- ~1cm CT- Sep 2018- Stable; monitor for now.   #Peripheral neuropathy grade 1-stable.  #  patient will get CBC CMP/CEA - in 6 months; will call at [phone].  Addendum: Reviewed the results of the CT scan done early in the morning-no evidence of any recurrent metastatic disease.  Stable kidney cyst likely benign.  Patient will be informed.

## 2018-07-16 LAB — CEA: CEA: 1.3 ng/mL (ref 0.0–4.7)

## 2018-09-21 ENCOUNTER — Encounter
Admission: RE | Disposition: A | Discharge status: Home / Self Care | Payer: Self-pay | Source: Home / Self Care | Attending: Unknown Physician Specialty

## 2018-09-21 ENCOUNTER — Ambulatory Visit: Payer: Commercial Managed Care - PPO | Admitting: Anesthesiology

## 2018-09-21 ENCOUNTER — Encounter: Payer: Self-pay | Admitting: *Deleted

## 2018-09-21 ENCOUNTER — Ambulatory Visit
Admission: RE | Admit: 2018-09-21 | Discharge: 2018-09-21 | Disposition: A | Discharge status: Home / Self Care | Payer: Commercial Managed Care - PPO | Attending: Unknown Physician Specialty | Admitting: Unknown Physician Specialty

## 2018-09-21 DIAGNOSIS — D123 Benign neoplasm of transverse colon: Secondary | ICD-10-CM | POA: Diagnosis not present

## 2018-09-21 DIAGNOSIS — Z79899 Other long term (current) drug therapy: Secondary | ICD-10-CM | POA: Insufficient documentation

## 2018-09-21 DIAGNOSIS — R0601 Orthopnea: Secondary | ICD-10-CM | POA: Insufficient documentation

## 2018-09-21 DIAGNOSIS — Z1211 Encounter for screening for malignant neoplasm of colon: Secondary | ICD-10-CM | POA: Insufficient documentation

## 2018-09-21 DIAGNOSIS — Z791 Long term (current) use of non-steroidal anti-inflammatories (NSAID): Secondary | ICD-10-CM | POA: Diagnosis not present

## 2018-09-21 DIAGNOSIS — E119 Type 2 diabetes mellitus without complications: Secondary | ICD-10-CM | POA: Diagnosis not present

## 2018-09-21 DIAGNOSIS — E785 Hyperlipidemia, unspecified: Secondary | ICD-10-CM | POA: Insufficient documentation

## 2018-09-21 DIAGNOSIS — Z9049 Acquired absence of other specified parts of digestive tract: Secondary | ICD-10-CM | POA: Diagnosis not present

## 2018-09-21 DIAGNOSIS — I429 Cardiomyopathy, unspecified: Secondary | ICD-10-CM | POA: Diagnosis not present

## 2018-09-21 DIAGNOSIS — Z7984 Long term (current) use of oral hypoglycemic drugs: Secondary | ICD-10-CM | POA: Diagnosis not present

## 2018-09-21 DIAGNOSIS — K640 First degree hemorrhoids: Secondary | ICD-10-CM | POA: Diagnosis not present

## 2018-09-21 DIAGNOSIS — I499 Cardiac arrhythmia, unspecified: Secondary | ICD-10-CM | POA: Insufficient documentation

## 2018-09-21 DIAGNOSIS — Z85038 Personal history of other malignant neoplasm of large intestine: Secondary | ICD-10-CM | POA: Insufficient documentation

## 2018-09-21 DIAGNOSIS — Z95810 Presence of automatic (implantable) cardiac defibrillator: Secondary | ICD-10-CM | POA: Insufficient documentation

## 2018-09-21 DIAGNOSIS — E669 Obesity, unspecified: Secondary | ICD-10-CM | POA: Insufficient documentation

## 2018-09-21 DIAGNOSIS — Z6833 Body mass index (BMI) 33.0-33.9, adult: Secondary | ICD-10-CM | POA: Diagnosis not present

## 2018-09-21 DIAGNOSIS — I38 Endocarditis, valve unspecified: Secondary | ICD-10-CM | POA: Diagnosis not present

## 2018-09-21 DIAGNOSIS — I110 Hypertensive heart disease with heart failure: Secondary | ICD-10-CM | POA: Diagnosis not present

## 2018-09-21 DIAGNOSIS — I509 Heart failure, unspecified: Secondary | ICD-10-CM | POA: Insufficient documentation

## 2018-09-21 HISTORY — DX: Presence of automatic (implantable) cardiac defibrillator: Z95.810

## 2018-09-21 HISTORY — PX: COLONOSCOPY WITH PROPOFOL: SHX5780

## 2018-09-21 LAB — GLUCOSE, CAPILLARY: Glucose-Capillary: 209 mg/dL — ABNORMAL HIGH (ref 70–99)

## 2018-09-21 SURGERY — COLONOSCOPY WITH PROPOFOL
Anesthesia: General

## 2018-09-21 MED ORDER — FENTANYL CITRATE (PF) 100 MCG/2ML IJ SOLN
INTRAMUSCULAR | Status: DC | PRN
  Administered 2018-09-21: 25 ug via INTRAVENOUS

## 2018-09-21 MED ORDER — LIDOCAINE HCL (PF) 2 % IJ SOLN
INTRAMUSCULAR | Status: DC | PRN
  Administered 2018-09-21: 100 mg

## 2018-09-21 MED ORDER — LIDOCAINE HCL (PF) 2 % IJ SOLN
INTRAMUSCULAR | Status: AC
  Filled 2018-09-21: qty 10

## 2018-09-21 MED ORDER — PROPOFOL 500 MG/50ML IV EMUL
INTRAVENOUS | Status: DC | PRN
  Administered 2018-09-21: 50 ug/kg/min via INTRAVENOUS

## 2018-09-21 MED ORDER — SODIUM CHLORIDE 0.9 % IV SOLN
INTRAVENOUS | Status: DC
  Administered 2018-09-21: 07:00:00 via INTRAVENOUS

## 2018-09-21 MED ORDER — MIDAZOLAM HCL 2 MG/2ML IJ SOLN
INTRAMUSCULAR | Status: AC
  Filled 2018-09-21: qty 2

## 2018-09-21 MED ORDER — PHENYLEPHRINE HCL 10 MG/ML IJ SOLN
INTRAMUSCULAR | Status: AC
  Filled 2018-09-21: qty 1

## 2018-09-21 MED ORDER — SODIUM CHLORIDE 0.9 % IV SOLN: INTRAVENOUS | Status: DC

## 2018-09-21 MED ORDER — EPHEDRINE SULFATE 50 MG/ML IJ SOLN
INTRAMUSCULAR | Status: AC
  Filled 2018-09-21: qty 1

## 2018-09-21 MED ORDER — MIDAZOLAM HCL 5 MG/5ML IJ SOLN
INTRAMUSCULAR | Status: DC | PRN
  Administered 2018-09-21: 2 mg via INTRAVENOUS

## 2018-09-21 MED ORDER — PROPOFOL 500 MG/50ML IV EMUL
INTRAVENOUS | Status: AC
  Filled 2018-09-21: qty 50

## 2018-09-21 MED ORDER — FENTANYL CITRATE (PF) 100 MCG/2ML IJ SOLN
INTRAMUSCULAR | Status: AC
  Filled 2018-09-21: qty 2

## 2018-09-21 MED ORDER — PROPOFOL 10 MG/ML IV BOLUS
INTRAVENOUS | Status: DC | PRN
  Administered 2018-09-21 (×2): 10 mg via INTRAVENOUS
  Administered 2018-09-21: 20 mg via INTRAVENOUS

## 2018-09-21 MED ORDER — EPHEDRINE SULFATE 50 MG/ML IJ SOLN
INTRAMUSCULAR | Status: DC | PRN
  Administered 2018-09-21: 5 mg via INTRAVENOUS
  Administered 2018-09-21: 15 mg via INTRAVENOUS
  Administered 2018-09-21: 10 mg via INTRAVENOUS
  Administered 2018-09-21 (×2): 5 mg via INTRAVENOUS

## 2018-09-21 NOTE — Anesthesia Postprocedure Evaluation (Signed)
Anesthesia Post Note  Patient: [name] [name]  Procedure(s) Performed: COLONOSCOPY WITH PROPOFOL (N/A )  Patient location during evaluation: Endoscopy Anesthesia Type: General Level of consciousness: awake and alert and oriented Pain management: pain level controlled Vital Signs Assessment: post-procedure vital signs reviewed and stable Respiratory status: spontaneous breathing, nonlabored ventilation and respiratory function stable Cardiovascular status: blood pressure returned to baseline and stable Postop Assessment: no signs of nausea or vomiting Anesthetic complications: no     Last Vitals:  Vitals:   09/21/18 0823 09/21/18 0833  BP: 106/75 108/75  Pulse: 86 83  Resp: 17 (!) 21  Temp:    SpO2: 97% 98%    Last Pain:  Vitals:   09/21/18 0833  TempSrc:   PainSc: 0-No pain                 [name]

## 2018-09-21 NOTE — Anesthesia Preprocedure Evaluation (Signed)
Anesthesia Evaluation  Patient identified by MRN, date of birth, ID band Patient awake    Reviewed: Allergy & Precautions, NPO status , Patient's Chart, lab work & pertinent test results  History of Anesthesia Complications Negative for: history of anesthetic complications  Airway Mallampati: III  TM Distance: >3 FB Neck ROM: Full    Dental no notable dental hx.    Pulmonary neg pulmonary ROS, neg sleep apnea, neg COPD,    breath sounds clear to auscultation- rhonchi (-) wheezing      Cardiovascular hypertension, Pt. on medications +CHF and + Orthopnea  (-) CAD, (-) Past MI, (-) Cardiac Stents and (-) CABG + Cardiac Defibrillator  Rhythm:Regular Rate:Normal - Systolic murmurs and - Diastolic murmurs Echo 3/00/76: SEVERE LV SYSTOLIC DYSFUNCTION  NORMAL RIGHT VENTRICULAR SYSTOLIC FUNCTION MILD VALVULAR REGURGITATION  NO VALVULAR STENOSIS PACER WIRE NOTED Contraction: SEVERE GLOBAL DECREASE Closest EF: 25% (Estimated) Size: SEVERELY ENLARGED Size: MILDLY ENLARGED Aortic: TRIVIAL AR Mitral: MILD MR Tricuspid: MILD TR   Neuro/Psych neg Seizures negative neurological ROS  negative psych ROS   GI/Hepatic negative GI ROS, Neg liver ROS,   Endo/Other  diabetes, Insulin Dependent, Oral Hypoglycemic Agents  Renal/GU negative Renal ROS     Musculoskeletal negative musculoskeletal ROS (+)   Abdominal (+) + obese,   Peds  Hematology negative hematology ROS (+)   Anesthesia Other Findings Past Medical History: 04/20/2014: AICD (automatic cardioverter/defibrillator) present     Comment:  Medtronic biventricular ICD model [place] CRT-D. 06/22/2013: Cardiac arrhythmia 06/22/2013: Cardiomyopathy ([place]) No date: CHF (congestive heart failure) ([place]) 2014: Colon cancer ([place])     Comment:  Partial Colon Resection. No date: Diabetes mellitus without complication ([facility])     Comment:  Patient takes Metformin No date:  Hyperlipidemia No date: Hypertension 07/16/2015: Neck pain on right side   Reproductive/Obstetrics                             Anesthesia Physical Anesthesia Plan  ASA: III  Anesthesia Plan: General   Post-op Pain Management:    Induction: Intravenous  PONV Risk Score and Plan: 1 and Propofol infusion  Airway Management Planned: Natural Airway  Additional Equipment:   Intra-op Plan:   Post-operative Plan:   Informed Consent: I have reviewed the patients History and Physical, chart, labs and discussed the procedure including the risks, benefits and alternatives for the proposed anesthesia with the patient or authorized representative who has indicated his/her understanding and acceptance.   Dental advisory given  Plan Discussed with: CRNA and Anesthesiologist  Anesthesia Plan Comments:         Anesthesia Quick Evaluation

## 2018-09-21 NOTE — Anesthesia Post-op Follow-up Note (Signed)
Anesthesia QCDR form completed.        

## 2018-09-21 NOTE — Op Note (Signed)
[facility] Gastroenterology Patient Name: [name] [name] Procedure Date: 09/21/2018 7:14 AM MRN: [id number] Account #: [id number] Date of Birth: 1956/09/24 Admit Type: Outpatient Age: 62 Room: [facility] ENDO ROOM 4 Gender: Male Note Status: Finalized Procedure:            Colonoscopy Indications:          High risk colon cancer surveillance: Personal history                        of colon cancer Providers:            Manya Silvas, MD Referring MD:         Irven Easterly. Kary Kos, MD (Referring MD) Medicines:            Propofol per Anesthesia Complications:        No immediate complications. Procedure:            Pre-Anesthesia Assessment:                       - After reviewing the risks and benefits, the patient                        was deemed in satisfactory condition to undergo the                        procedure.                       After obtaining informed consent, the colonoscope was                        passed under direct vision. Throughout the procedure,                        the patient's blood pressure, pulse, and oxygen                        saturations were monitored continuously. The                        Colonoscope was introduced through the anus and                        advanced to the the cecum, identified by appendiceal                        orifice and ileocecal valve. The colonoscopy was                        performed without difficulty. The patient tolerated the                        procedure well. The quality of the bowel preparation                        was good. Findings:      A small polyp was found in the proximal transverse colon. The polyp was       sessile. The polyp was removed with a cold snare. Tiny remnant removed       with forceps. Resection and retrieval were complete.  Internal hemorrhoids were found during endoscopy. The hemorrhoids were       small and Grade I (internal hemorrhoids that do not prolapse).     The exam was otherwise without abnormality. Impression:           - One small polyp in the proximal transverse colon,                        removed with a cold snare. Resected and retrieved.                       - Internal hemorrhoids.                       - The examination was otherwise normal. Recommendation:       - Await pathology results. Manya Silvas, MD 09/21/2018 8:03:12 AM This report has been signed electronically. Number of Addenda: 0 Note Initiated On: 09/21/2018 7:14 AM Scope Withdrawal Time: 0 hours 15 minutes 27 seconds  Total Procedure Duration: 0 hours 21 minutes 1 second       [facility]

## 2018-09-21 NOTE — H&P (Signed)
Primary Care Physician:  Maryland Pink, MD Primary Gastroenterologist:  Dr. Vira Agar  Pre-Procedure History & Physical: HPI:  [name] [name] is a 62 y.o. male is here for an colonoscopy.   Past Medical History:  Diagnosis Date  . 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  . Cardiac arrhythmia 06/22/2013  . Cardiomyopathy ([place]) 06/22/2013  . CHF (congestive heart failure) ([place])   . Colon cancer [facility]) 2014   Partial Colon Resection.  . Diabetes mellitus without complication ([place])    Patient takes Metformin  . Hyperlipidemia   . Hypertension   . Neck pain on right side 07/16/2015    Past Surgical History:  Procedure Laterality Date  . Biventricular implantable cardioverter-defbrillator Left 04/27/2014   Medtronic biventricular ICD model Consulta CRTD [place], serial [id number]. The right atrial lead is Medtronic [id number], serial # [id number].  The right ventrcular lead is Medtronic [id number], serial # [id number] and the LV lead is 4196 Medtronic serial # [id number]  . BLADDER SURGERY N/A 06/22/2013   "bladder stretched"  . CARDIAC CATHETERIZATION    . CHOLECYSTECTOMY  01/22/2014   Dr. Tamala Julian  . COLECTOMY Left 05/25/2013   sigmoid colectomy  . COLONOSCOPY WITH PROPOFOL N/A 08/03/2016   Procedure: COLONOSCOPY WITH PROPOFOL;  Surgeon: Manya Silvas, MD;  Location: [facility] ENDOSCOPY;  Service: Endoscopy;  Laterality: N/A;  . INSERTION CENTRAL VENOUS ACCESS DEVICE W/ SUBCUTANEOUS PORT  2014   Dr. Tamala Julian  . ROTATOR CUFF REPAIR Left 08/2003    Prior to Admission medications   Medication Sig Start Date End Date Taking? Authorizing Provider  carvedilol (COREG) 3.125 MG tablet Take 3.125 mg by mouth 2 (two) times daily with a meal.  03/30/13  Yes [provider]  furosemide (LASIX) 20 MG tablet Take 10 mg by mouth daily as needed.  11/30/16 07/15/20 Yes [provider]  glimepiride (AMARYL) 1 MG tablet Take 2  mg by mouth 2 (two) times daily. Take 2 tablets twice daily 03/30/13  Yes [provider]  insulin aspart protamine - aspart (NOVOLOG 70/30 MIX) (70-30) 100 UNIT/ML FlexPen Take 45 units before breakfast. Take 55 units before supper. Always take BEFORE the meals. 06/30/18  Yes [provider]  metFORMIN (GLUCOPHAGE) 500 MG tablet Take 1,000 mg by mouth 2 (two) times daily with a meal. 03/30/13  Yes [provider]  simvastatin (ZOCOR) 40 MG tablet Take 40 mg by mouth daily. Once daily at bedtime 11/09/08  Yes [provider]  sitaGLIPtin (JANUVIA) 100 MG tablet Take 100 mg by mouth 1 day or 1 dose. In the morning 05/22/13  Yes [provider]  canagliflozin (INVOKANA) 300 MG TABS tablet Take 300 mg by mouth daily before breakfast.    [provider]  glucose blood (ONE TOUCH ULTRA TEST) test strip Use 2 (two) times daily. Use as instructed. 09/21/14   [provider]  meloxicam (MOBIC) 15 MG tablet Take 15 mg by mouth daily.    [provider]  olmesartan-hydrochlorothiazide (BENICAR HCT) 20-12.5 MG per tablet Take 0.5 tablets by mouth daily. 03/30/13   [provider]    Allergies as of 09/19/2018 - Review Complete 07/15/2018  Allergen Reaction Noted  . No known allergies  01/08/2015    Family History  Problem Relation Age of Onset  . Cancer Mother   . Diabetes Father   . Stroke Father   . Sleep apnea Sister   . Cancer Maternal Aunt  Social History   Socioeconomic History  . Marital status: Married    Spouse name: Not on file  . Number of children: Not on file  . Years of education: Not on file  . Highest education level: Not on file  Occupational History  . Not on file  Social Needs  . Financial resource strain: Not on file  . Food insecurity:    Worry: Not on file    Inability: Not on file  . Transportation needs:    Medical: Not on file    Non-medical: Not on file  Tobacco Use  . Smoking  status: Never Smoker  . Smokeless tobacco: Never Used  Substance and Sexual Activity  . Alcohol use: No  . Drug use: No  . Sexual activity: Not on file  Lifestyle  . Physical activity:    Days per week: Not on file    Minutes per session: Not on file  . Stress: Not on file  Relationships  . Social connections:    Talks on phone: Not on file    Gets together: Not on file    Attends religious service: Not on file    Active member of club or organization: Not on file    Attends meetings of clubs or organizations: Not on file    Relationship status: Not on file  . Intimate partner violence:    Fear of current or ex partner: Not on file    Emotionally abused: Not on file    Physically abused: Not on file    Forced sexual activity: Not on file  Other Topics Concern  . Not on file  Social History Narrative  . Not on file    Review of Systems: See HPI, otherwise negative ROS  Physical Exam: BP (!) 120/91   Pulse 78   Temp (!) 96.1 F (35.6 C) (Tympanic)   Resp 18   Ht 5\' 9"  (1.753 m)   Wt 104.3 kg   SpO2 96%   BMI 33.97 kg/m  General:   Alert,  pleasant and cooperative in NAD Head:  Normocephalic and atraumatic. Neck:  Supple; no masses or thyromegaly. Lungs:  Clear throughout to auscultation.    Heart:  Regular rate and rhythm. Abdomen:  Soft, nontender and nondistended. Normal bowel sounds, without guarding, and without rebound.   Neurologic:  Alert and  oriented x4;  grossly normal neurologically.  Impression/Plan: [name] is here for an colonoscopy to be performed for [facility] colon cancer.  Risks, benefits, limitations, and alternatives regarding  colonoscopy have been reviewed with the patient.  Questions have been answered.  All parties agreeable.   Gaylyn Cheers, MD  09/21/2018, 7:31 AM

## 2018-09-21 NOTE — Transfer of Care (Signed)
Immediate Anesthesia Transfer of Care Note  Patient: [name] [name]  Procedure(s) Performed: COLONOSCOPY WITH PROPOFOL (N/A )  Patient Location: PACU  Anesthesia Type:General  Level of Consciousness: sedated  Airway & Oxygen Therapy: Patient Spontanous Breathing and Patient connected to nasal cannula oxygen  Post-op Assessment: Report given to RN and Post -op Vital signs reviewed and stable  Post vital signs: Reviewed and stable  Last Vitals:  Vitals Value Taken Time  BP    Temp    Pulse 89 09/21/2018  8:03 AM  Resp 22 09/21/2018  8:03 AM  SpO2 98 % 09/21/2018  8:03 AM  Vitals shown include unvalidated device data.  Last Pain:  Vitals:   09/21/18 0803  TempSrc: (P) Tympanic         Complications: No apparent anesthesia complications

## 2018-09-22 LAB — SURGICAL PATHOLOGY

## 2018-10-05 DIAGNOSIS — E119 Type 2 diabetes mellitus without complications: Secondary | ICD-10-CM | POA: Diagnosis not present

## 2018-11-05 DIAGNOSIS — E119 Type 2 diabetes mellitus without complications: Secondary | ICD-10-CM | POA: Diagnosis not present

## 2018-12-04 DIAGNOSIS — E119 Type 2 diabetes mellitus without complications: Secondary | ICD-10-CM | POA: Diagnosis not present

## 2019-01-05 ENCOUNTER — Other Ambulatory Visit: Payer: Self-pay

## 2019-01-05 DIAGNOSIS — C187 Malignant neoplasm of sigmoid colon: Secondary | ICD-10-CM

## 2019-01-13 ENCOUNTER — Inpatient Hospital Stay: Payer: Commercial Managed Care - PPO

## 2019-01-13 ENCOUNTER — Inpatient Hospital Stay: Payer: Commercial Managed Care - PPO | Admitting: Internal Medicine

## 2019-03-15 ENCOUNTER — Ambulatory Visit: Payer: Commercial Managed Care - PPO | Admitting: Internal Medicine

## 2019-03-15 ENCOUNTER — Other Ambulatory Visit: Payer: Commercial Managed Care - PPO

## 2019-04-04 ENCOUNTER — Other Ambulatory Visit: Payer: Self-pay

## 2019-04-04 ENCOUNTER — Inpatient Hospital Stay (HOSPITAL_BASED_OUTPATIENT_CLINIC_OR_DEPARTMENT_OTHER): Payer: Commercial Managed Care - PPO | Admitting: Internal Medicine

## 2019-04-04 ENCOUNTER — Other Ambulatory Visit: Payer: Self-pay | Admitting: Internal Medicine

## 2019-04-04 ENCOUNTER — Other Ambulatory Visit: Payer: Self-pay | Admitting: *Deleted

## 2019-04-04 ENCOUNTER — Inpatient Hospital Stay: Payer: Commercial Managed Care - PPO | Attending: Internal Medicine

## 2019-04-04 DIAGNOSIS — R202 Paresthesia of skin: Secondary | ICD-10-CM | POA: Insufficient documentation

## 2019-04-04 DIAGNOSIS — Z79899 Other long term (current) drug therapy: Secondary | ICD-10-CM | POA: Diagnosis not present

## 2019-04-04 DIAGNOSIS — N179 Acute kidney failure, unspecified: Secondary | ICD-10-CM | POA: Diagnosis not present

## 2019-04-04 DIAGNOSIS — Z836 Family history of other diseases of the respiratory system: Secondary | ICD-10-CM | POA: Diagnosis not present

## 2019-04-04 DIAGNOSIS — C187 Malignant neoplasm of sigmoid colon: Secondary | ICD-10-CM

## 2019-04-04 DIAGNOSIS — N281 Cyst of kidney, acquired: Secondary | ICD-10-CM | POA: Insufficient documentation

## 2019-04-04 DIAGNOSIS — Z823 Family history of stroke: Secondary | ICD-10-CM | POA: Insufficient documentation

## 2019-04-04 DIAGNOSIS — Z809 Family history of malignant neoplasm, unspecified: Secondary | ICD-10-CM | POA: Insufficient documentation

## 2019-04-04 DIAGNOSIS — Z833 Family history of diabetes mellitus: Secondary | ICD-10-CM | POA: Insufficient documentation

## 2019-04-04 DIAGNOSIS — E1142 Type 2 diabetes mellitus with diabetic polyneuropathy: Secondary | ICD-10-CM | POA: Diagnosis not present

## 2019-04-04 DIAGNOSIS — Z794 Long term (current) use of insulin: Secondary | ICD-10-CM | POA: Diagnosis not present

## 2019-04-04 DIAGNOSIS — E1165 Type 2 diabetes mellitus with hyperglycemia: Secondary | ICD-10-CM | POA: Insufficient documentation

## 2019-04-04 DIAGNOSIS — Z9049 Acquired absence of other specified parts of digestive tract: Secondary | ICD-10-CM | POA: Insufficient documentation

## 2019-04-04 LAB — CBC WITH DIFFERENTIAL/PLATELET
Abs Immature Granulocytes: 0.05 10*3/uL (ref 0.00–0.07)
Basophils Absolute: 0.1 10*3/uL (ref 0.0–0.1)
Basophils Relative: 1 %
Eosinophils Absolute: 0.5 10*3/uL (ref 0.0–0.5)
Eosinophils Relative: 4 %
HCT: 50 % (ref 39.0–52.0)
Hemoglobin: 16.4 g/dL (ref 13.0–17.0)
Immature Granulocytes: 0 %
Lymphocytes Relative: 24 %
Lymphs Abs: 3.3 10*3/uL (ref 0.7–4.0)
MCH: 30.3 pg (ref 26.0–34.0)
MCHC: 32.8 g/dL (ref 30.0–36.0)
MCV: 92.3 fL (ref 80.0–100.0)
Monocytes Absolute: 1.3 10*3/uL — ABNORMAL HIGH (ref 0.1–1.0)
Monocytes Relative: 9 %
Neutro Abs: 8.9 10*3/uL — ABNORMAL HIGH (ref 1.7–7.7)
Neutrophils Relative %: 62 %
Platelets: 209 10*3/uL (ref 150–400)
RBC: 5.42 MIL/uL (ref 4.22–5.81)
RDW: 13.5 % (ref 11.5–15.5)
WBC: 14.1 10*3/uL — ABNORMAL HIGH (ref 4.0–10.5)
nRBC: 0 % (ref 0.0–0.2)

## 2019-04-04 LAB — COMPREHENSIVE METABOLIC PANEL
ALT: 35 U/L (ref 0–44)
AST: 33 U/L (ref 15–41)
Albumin: 4.6 g/dL (ref 3.5–5.0)
Alkaline Phosphatase: 45 U/L (ref 38–126)
Anion gap: 7 (ref 5–15)
BUN: 25 mg/dL — ABNORMAL HIGH (ref 8–23)
CO2: 26 mmol/L (ref 22–32)
Calcium: 9.9 mg/dL (ref 8.9–10.3)
Chloride: 106 mmol/L (ref 98–111)
Creatinine, Ser: 1.84 mg/dL — ABNORMAL HIGH (ref 0.61–1.24)
GFR calc Af Amer: 45 mL/min — ABNORMAL LOW (ref >60)
GFR calc non Af Amer: 38 mL/min — ABNORMAL LOW (ref >60)
Glucose, Bld: 129 mg/dL — ABNORMAL HIGH (ref 70–99)
Potassium: 6.1 mmol/L — ABNORMAL HIGH (ref 3.5–5.1)
Sodium: 139 mmol/L (ref 135–145)
Total Bilirubin: 1.1 mg/dL (ref 0.3–1.2)
Total Protein: 8.4 g/dL — ABNORMAL HIGH (ref 6.5–8.1)

## 2019-04-04 NOTE — Progress Notes (Signed)
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 Aug 2015- Colo [Dr.Elliot] Tubular adenoma x 2 ; CT march 2017- NED; CT OCT 2017- NED; colo- oct 2017 [repeat in 2019]  # LEFT KIDNEY cysts/ exophytic lesions <cm- STABLE; cont f/u on CT scan.   # Hx defib; DM  # OCT 2018- MMR-STABLE.   DIAGNOSIS: COLON CANCER  STAGE:   III      ;GOALS: cure  CURRENT/MOST RECENT THERAPY: surveillance    Cancer of sigmoid colon ([place])       INTERVAL HISTORY:  A very pleasant 63 -year-old male patient with above history of colon cancer stage III on protocol CALGB 15176- is here for follow-up.  Patient states his diabetes has been poorly.  He continues to be on insulin and oral hypoglycemic agents.  He denies any nausea vomiting but denies abdominal pain.  No chest pain or shortness of breath or cough.  No blood in stools or black or stools.  Patient denies any nausea vomiting abdominal pain.  He is awaiting to have a colonoscopy again in December 2019.  No blood in stools  Review of Systems  Constitutional: Negative for chills, diaphoresis, fever, malaise/fatigue and weight loss.  HENT: Negative for nosebleeds and sore throat.   Eyes: Negative for double vision.  Respiratory: Negative for cough, hemoptysis, sputum production, shortness of breath and wheezing.   Cardiovascular: Negative for chest pain, palpitations, orthopnea and leg swelling.  Gastrointestinal: Negative for abdominal pain, blood in stool, constipation, diarrhea, heartburn, melena, nausea and vomiting.   Genitourinary: Negative for dysuria, frequency and urgency.  Musculoskeletal: Negative for back pain and joint pain.  Skin: Negative.  Negative for itching and rash.  Neurological: Positive for tingling. Negative for dizziness, focal weakness, weakness and headaches.  Endo/Heme/Allergies: Does not bruise/bleed easily.  Psychiatric/Behavioral: Negative for depression. The patient is not nervous/anxious and does not have insomnia.      PAST MEDICAL HISTORY :  Past Medical History:  Diagnosis Date  . 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  . Cardiac arrhythmia 06/22/2013  . Cardiomyopathy ([place]) 06/22/2013  . CHF (congestive heart failure) ([place])   . Colon cancer [facility]) 2014   Partial Colon Resection.  . Diabetes mellitus without complication ([place])    Patient takes Metformin  . Hyperlipidemia   . Hypertension   . Neck pain on right side 07/16/2015    PAST SURGICAL HISTORY :   Past Surgical History:  Procedure Laterality Date  . Biventricular implantable cardioverter-defbrillator Left 04/27/2014   Medtronic biventricular ICD model Consulta CRTD [place], serial [id number]. The right atrial lead is Medtronic [id number], serial # [id number].  The right ventrcular lead is Medtronic [id number], serial # [id number] and the LV lead is 4196 Medtronic serial # [id number]  . BLADDER SURGERY N/A 06/22/2013   "bladder stretched"  . CARDIAC CATHETERIZATION    . CHOLECYSTECTOMY  01/22/2014   Dr. Tamala Julian  . COLECTOMY Left 05/25/2013   sigmoid colectomy  . COLONOSCOPY WITH PROPOFOL N/A 08/03/2016   Procedure: COLONOSCOPY WITH PROPOFOL;  Surgeon: Manya Silvas, MD;  Location: [facility] ENDOSCOPY;  Service: Endoscopy;  Laterality: N/A;  . COLONOSCOPY WITH PROPOFOL N/A 09/21/2018   Procedure: COLONOSCOPY WITH PROPOFOL;  Surgeon: Manya Silvas, MD;  Location: [facility] ENDOSCOPY;  Service: Endoscopy;  Laterality: N/A;  . INSERTION CENTRAL VENOUS  ACCESS DEVICE W/ SUBCUTANEOUS PORT  2014   Dr. Tamala Julian  . ROTATOR CUFF REPAIR Left 08/2003    FAMILY HISTORY :  Mom- breast/colon; mat- aunt- breast. No bladder /stomach cancer.  Family History  Problem Relation Age of Onset  . Cancer Mother   . Diabetes Father   . Stroke Father   . Sleep apnea Sister   . Cancer Maternal Aunt     SOCIAL HISTORY:   Social History   Tobacco Use  . Smoking status: Never Smoker  . Smokeless tobacco: Never Used  Substance Use Topics  . Alcohol use: No  . Drug use: No    ALLERGIES:  is allergic to no known allergies.  MEDICATIONS:  Current Outpatient Medications  Medication Sig Dispense Refill  . canagliflozin (INVOKANA) 300 MG TABS tablet Take 300 mg by mouth daily before breakfast.    . carvedilol (COREG) 3.125 MG tablet Take 3.125 mg by mouth 2 (two) times daily with a meal.     . dapagliflozin propanediol (FARXIGA) 10 MG TABS tablet Take 10 mg by mouth daily.    . furosemide (LASIX) 20 MG tablet Take 10 mg by mouth daily as needed.     Marland Kitchen glimepiride (AMARYL) 1 MG tablet Take 2 mg by mouth 2 (two) times daily. Take 2 tablets twice daily    . glucose blood (ONE TOUCH ULTRA TEST) test strip Use 2 (two) times daily. Use as instructed.    . insulin aspart protamine - aspart (NOVOLOG 70/30 MIX) (70-30) 100 UNIT/ML FlexPen Take 45 units before breakfast. Take 55 units before supper. Always take BEFORE the meals.    . meloxicam (MOBIC) 15 MG tablet Take 15 mg by mouth daily.    . metFORMIN (GLUCOPHAGE) 500 MG tablet Take 1,000 mg by mouth 2 (two) times daily with a meal.    . olmesartan-hydrochlorothiazide (BENICAR HCT) 20-12.5 MG per tablet Take 0.5 tablets by mouth daily.    . simvastatin (ZOCOR) 40 MG tablet Take 40 mg by mouth daily. Once daily at bedtime    . sitaGLIPtin (JANUVIA) 100 MG tablet Take 100 mg by mouth 1 day or 1 dose. In the morning     No current facility-administered medications for this visit.     PHYSICAL EXAMINATION: ECOG  PERFORMANCE STATUS: 0 - Asymptomatic  BP 99/70 (BP Location: Right Arm, Patient Position: Sitting, Cuff Size: Normal)   Pulse 84   Temp 99.1 F (37.3 C) (Tympanic)   Resp 20   Ht 5' 9"  (1.753 m)   Wt 230 lb (104.3 kg)   BMI 33.97 kg/m   Filed Weights   04/04/19 1420  Weight: 230 lb (104.3 kg)    Physical Exam  Constitutional: He is oriented to person, place, and time and well-developed, well-nourished, and in no distress.  HENT:  Head: Normocephalic and atraumatic.  Mouth/Throat: Oropharynx is clear and moist. No oropharyngeal exudate.  Eyes: Pupils are equal, round, and reactive to light.  Neck: Normal range of motion. Neck supple.  Cardiovascular: Normal rate and regular rhythm.  Pulmonary/Chest: No respiratory distress. He has no wheezes.  Abdominal: Soft. Bowel sounds are normal. He exhibits no distension and no mass. There is no abdominal tenderness. There is no rebound and no  guarding.  Musculoskeletal: Normal range of motion.        General: No tenderness or edema.  Neurological: He is alert and oriented to person, place, and time.  Skin: Skin is warm.  Psychiatric: Affect normal.    LABORATORY DATA:  I have reviewed the data as listed    Component Value Date/Time   NA 139 04/04/2019 1320   NA 132 (L) 01/07/2015 1052   K 6.1 (H) 04/04/2019 1320   K 4.3 01/07/2015 1052   CL 106 04/04/2019 1320   CL 101 01/07/2015 1052   CO2 26 04/04/2019 1320   CO2 23 01/07/2015 1052   GLUCOSE 129 (H) 04/04/2019 1320   GLUCOSE 404 (H) 01/07/2015 1052   BUN 25 (H) 04/04/2019 1320   BUN 14 01/07/2015 1052   CREATININE 1.84 (H) 04/04/2019 1320   CREATININE 0.99 01/07/2015 1052   CALCIUM 9.9 04/04/2019 1320   CALCIUM 9.2 01/07/2015 1052   PROT 8.4 (H) 04/04/2019 1320   PROT 7.7 01/07/2015 1052   ALBUMIN 4.6 04/04/2019 1320   ALBUMIN 4.3 01/07/2015 1052   AST 33 04/04/2019 1320   AST 42 (H) 01/07/2015 1052   ALT 35 04/04/2019 1320   ALT 51 01/07/2015 1052   ALKPHOS 45  04/04/2019 1320   ALKPHOS 64 01/07/2015 1052   BILITOT 1.1 04/04/2019 1320   BILITOT 1.0 01/07/2015 1052   GFRNONAA 38 (L) 04/04/2019 1320   GFRNONAA >60 01/07/2015 1052   GFRAA 45 (L) 04/04/2019 1320   GFRAA >60 01/07/2015 1052    No results found for: SPEP, UPEP  Lab Results  Component Value Date   WBC 14.1 (H) 04/04/2019   NEUTROABS 8.9 (H) 04/04/2019   HGB 16.4 04/04/2019   HCT 50.0 04/04/2019   MCV 92.3 04/04/2019   PLT 209 04/04/2019      Chemistry      Component Value Date/Time   NA 139 04/04/2019 1320   NA 132 (L) 01/07/2015 1052   K 6.1 (H) 04/04/2019 1320   K 4.3 01/07/2015 1052   CL 106 04/04/2019 1320   CL 101 01/07/2015 1052   CO2 26 04/04/2019 1320   CO2 23 01/07/2015 1052   BUN 25 (H) 04/04/2019 1320   BUN 14 01/07/2015 1052   CREATININE 1.84 (H) 04/04/2019 1320   CREATININE 0.99 01/07/2015 1052      Component Value Date/Time   CALCIUM 9.9 04/04/2019 1320   CALCIUM 9.2 01/07/2015 1052   ALKPHOS 45 04/04/2019 1320   ALKPHOS 64 01/07/2015 1052   AST 33 04/04/2019 1320   AST 42 (H) 01/07/2015 1052   ALT 35 04/04/2019 1320   ALT 51 01/07/2015 1052   BILITOT 1.1 04/04/2019 1320   BILITOT 1.0 01/07/2015 1052         ASSESSMENT & PLAN:   Cancer of sigmoid colon ([place]) # COLON CA stage III-on CALGB 16109 [Intolerance to celecoxib/placebo discontinued in Oct 2016 sec to elevated blood sugars]. Stable.   # Oct 2019- CT- NED:  Dec 2019- colo- no clinically evidence of cancer. Will need CT scan in Oct 2020; will discuss with clinical trials RNs.   # AKI- creat 1.86/baseline 1.0; potassium 6.1.  Protein elevated at 8.4.  Etiology is unclear.  Recommend to holding-Lasix/Benicar.  Recommend increased fluid intake.  Hold off NSAIDs.  Will repeat labs in 2 days/myeloma panel kappa lambda light chain.  Check urine analysis/culture.  #Poorly controlled diabetes-hemoglobin A1c march 2019- 10/ poorly controlled.   #Peripheral neuropathy grade 1- stable.  #  DISPOSITION:  # labs- Bmp/ MM panel; K/lambda light chain on 07/02- Thursday.  # follow up in late October 2020- MD- labs-cbc/cmp/cea; Dr.B   # 25 minutes face-to-face with the patient discussing the above plan of care; more than 50% of time spent on prognosis/ natural history; counseling and coordination.    Cammie Sickle, MD 04/04/2019 9:35 PM

## 2019-04-04 NOTE — Patient Instructions (Signed)
#  Drink plenty of fluids/water #Stop Benicar #Stop Lasix

## 2019-04-04 NOTE — Assessment & Plan Note (Addendum)
#   COLON CA stage III-on CALGB 86751 [Intolerance to celecoxib/placebo discontinued in Oct 2016 sec to elevated blood sugars]. Stable.   # Oct 2019- CT- NED:  Dec 2019- colo- no clinically evidence of cancer. Will need CT scan in Oct 2020; will discuss with clinical trials RNs.   # AKI- creat 1.86/baseline 1.0; potassium 6.1.  Protein elevated at 8.4.  Etiology is unclear.  Recommend to holding-Lasix/Benicar.  Recommend increased fluid intake.  Hold off NSAIDs.  Will repeat labs in 2 days/myeloma panel kappa lambda light chain.  Check urine analysis/culture.  #Poorly controlled diabetes-hemoglobin A1c march 2019- 10/ poorly controlled.   #Peripheral neuropathy grade 1- stable.  # DISPOSITION:  # labs- Bmp/ MM panel; K/lambda light chain on 07/02- Thursday.  # follow up in late October 2020- MD- labs-cbc/cmp/cea; Dr.B

## 2019-04-05 LAB — CEA: CEA: 1.3 ng/mL (ref 0.0–4.7)

## 2019-04-06 ENCOUNTER — Inpatient Hospital Stay: Payer: Commercial Managed Care - PPO | Attending: Internal Medicine

## 2019-04-06 ENCOUNTER — Other Ambulatory Visit: Payer: Self-pay

## 2019-04-06 DIAGNOSIS — C187 Malignant neoplasm of sigmoid colon: Secondary | ICD-10-CM

## 2019-04-06 DIAGNOSIS — R202 Paresthesia of skin: Secondary | ICD-10-CM | POA: Insufficient documentation

## 2019-04-06 DIAGNOSIS — Z825 Family history of asthma and other chronic lower respiratory diseases: Secondary | ICD-10-CM | POA: Insufficient documentation

## 2019-04-06 DIAGNOSIS — Z833 Family history of diabetes mellitus: Secondary | ICD-10-CM | POA: Diagnosis not present

## 2019-04-06 DIAGNOSIS — Z7984 Long term (current) use of oral hypoglycemic drugs: Secondary | ICD-10-CM | POA: Diagnosis not present

## 2019-04-06 DIAGNOSIS — Z809 Family history of malignant neoplasm, unspecified: Secondary | ICD-10-CM | POA: Insufficient documentation

## 2019-04-06 DIAGNOSIS — Z79899 Other long term (current) drug therapy: Secondary | ICD-10-CM | POA: Diagnosis not present

## 2019-04-06 DIAGNOSIS — N281 Cyst of kidney, acquired: Secondary | ICD-10-CM | POA: Insufficient documentation

## 2019-04-06 DIAGNOSIS — E119 Type 2 diabetes mellitus without complications: Secondary | ICD-10-CM | POA: Diagnosis not present

## 2019-04-06 DIAGNOSIS — Z794 Long term (current) use of insulin: Secondary | ICD-10-CM | POA: Insufficient documentation

## 2019-04-06 DIAGNOSIS — Z823 Family history of stroke: Secondary | ICD-10-CM | POA: Insufficient documentation

## 2019-04-06 DIAGNOSIS — N179 Acute kidney failure, unspecified: Secondary | ICD-10-CM

## 2019-04-06 DIAGNOSIS — G6289 Other specified polyneuropathies: Secondary | ICD-10-CM | POA: Diagnosis not present

## 2019-04-06 LAB — URINALYSIS, COMPLETE (UACMP) WITH MICROSCOPIC
Bacteria, UA: NONE SEEN
Bilirubin Urine: NEGATIVE
Glucose, UA: >=500 mg/dL — AB
Hgb urine dipstick: NEGATIVE
Ketones, ur: NEGATIVE mg/dL
Leukocytes,Ua: NEGATIVE
Nitrite: NEGATIVE
Protein, ur: NEGATIVE mg/dL
Specific Gravity, Urine: 1.022 (ref 1.005–1.030)
pH: 6 (ref 5.0–8.0)

## 2019-04-06 LAB — BASIC METABOLIC PANEL
Anion gap: 7 (ref 5–15)
BUN: 19 mg/dL (ref 8–23)
CO2: 22 mmol/L (ref 22–32)
Calcium: 9.5 mg/dL (ref 8.9–10.3)
Chloride: 108 mmol/L (ref 98–111)
Creatinine, Ser: 1.18 mg/dL (ref 0.61–1.24)
GFR calc Af Amer: >60 mL/min (ref >60)
GFR calc non Af Amer: >60 mL/min (ref >60)
Glucose, Bld: 141 mg/dL — ABNORMAL HIGH (ref 70–99)
Potassium: 4.9 mmol/L (ref 3.5–5.1)
Sodium: 137 mmol/L (ref 135–145)

## 2019-04-07 LAB — KAPPA/LAMBDA LIGHT CHAINS
Kappa free light chain: 44.9 mg/L — ABNORMAL HIGH (ref 3.3–19.4)
Kappa, lambda light chain ratio: 1.38 (ref 0.26–1.65)
Lambda free light chains: 32.5 mg/L — ABNORMAL HIGH (ref 5.7–26.3)

## 2019-04-10 LAB — MULTIPLE MYELOMA PANEL, SERUM
Albumin SerPl Elph-Mcnc: 3.8 g/dL (ref 2.9–4.4)
Albumin/Glob SerPl: 1.3 (ref 0.7–1.7)
Alpha 1: 0.2 g/dL (ref 0.0–0.4)
Alpha2 Glob SerPl Elph-Mcnc: 0.6 g/dL (ref 0.4–1.0)
B-Globulin SerPl Elph-Mcnc: 0.9 g/dL (ref 0.7–1.3)
Gamma Glob SerPl Elph-Mcnc: 1.3 g/dL (ref 0.4–1.8)
Globulin, Total: 3 g/dL (ref 2.2–3.9)
IgA: 195 mg/dL (ref 61–437)
IgG (Immunoglobin G), Serum: 1343 mg/dL (ref 603–1613)
IgM (Immunoglobulin M), Srm: 99 mg/dL (ref 20–172)
Total Protein ELP: 6.8 g/dL (ref 6.0–8.5)

## 2019-07-31 ENCOUNTER — Other Ambulatory Visit: Payer: Self-pay

## 2019-07-31 ENCOUNTER — Encounter: Payer: Self-pay | Admitting: Nurse Practitioner

## 2019-07-31 NOTE — Progress Notes (Signed)
Patient stated that he had been doing well with no complaints. 

## 2019-08-01 ENCOUNTER — Inpatient Hospital Stay (HOSPITAL_BASED_OUTPATIENT_CLINIC_OR_DEPARTMENT_OTHER): Payer: Commercial Managed Care - PPO | Admitting: Nurse Practitioner

## 2019-08-01 ENCOUNTER — Other Ambulatory Visit: Payer: Self-pay

## 2019-08-01 ENCOUNTER — Inpatient Hospital Stay: Payer: Commercial Managed Care - PPO | Attending: Internal Medicine

## 2019-08-01 VITALS — BP 106/79 | HR 82 | Temp 97.8°F | Resp 20 | Ht 69.0 in | Wt 245.0 lb

## 2019-08-01 DIAGNOSIS — E1142 Type 2 diabetes mellitus with diabetic polyneuropathy: Secondary | ICD-10-CM | POA: Diagnosis not present

## 2019-08-01 DIAGNOSIS — E1165 Type 2 diabetes mellitus with hyperglycemia: Secondary | ICD-10-CM | POA: Diagnosis not present

## 2019-08-01 DIAGNOSIS — C187 Malignant neoplasm of sigmoid colon: Secondary | ICD-10-CM

## 2019-08-01 DIAGNOSIS — E785 Hyperlipidemia, unspecified: Secondary | ICD-10-CM | POA: Insufficient documentation

## 2019-08-01 DIAGNOSIS — Z79899 Other long term (current) drug therapy: Secondary | ICD-10-CM | POA: Diagnosis not present

## 2019-08-01 DIAGNOSIS — Z794 Long term (current) use of insulin: Secondary | ICD-10-CM | POA: Insufficient documentation

## 2019-08-01 DIAGNOSIS — I509 Heart failure, unspecified: Secondary | ICD-10-CM | POA: Insufficient documentation

## 2019-08-01 DIAGNOSIS — I429 Cardiomyopathy, unspecified: Secondary | ICD-10-CM | POA: Insufficient documentation

## 2019-08-01 DIAGNOSIS — N179 Acute kidney failure, unspecified: Secondary | ICD-10-CM | POA: Insufficient documentation

## 2019-08-01 DIAGNOSIS — I110 Hypertensive heart disease with heart failure: Secondary | ICD-10-CM | POA: Insufficient documentation

## 2019-08-01 LAB — COMPREHENSIVE METABOLIC PANEL
ALT: 32 U/L (ref 0–44)
AST: 32 U/L (ref 15–41)
Albumin: 4.3 g/dL (ref 3.5–5.0)
Alkaline Phosphatase: 47 U/L (ref 38–126)
Anion gap: 11 (ref 5–15)
BUN: 13 mg/dL (ref 8–23)
CO2: 23 mmol/L (ref 22–32)
Calcium: 9.6 mg/dL (ref 8.9–10.3)
Chloride: 105 mmol/L (ref 98–111)
Creatinine, Ser: 1.09 mg/dL (ref 0.61–1.24)
GFR calc Af Amer: >60 mL/min (ref >60)
GFR calc non Af Amer: >60 mL/min (ref >60)
Glucose, Bld: 200 mg/dL — ABNORMAL HIGH (ref 70–99)
Potassium: 4.3 mmol/L (ref 3.5–5.1)
Sodium: 139 mmol/L (ref 135–145)
Total Bilirubin: 1 mg/dL (ref 0.3–1.2)
Total Protein: 8 g/dL (ref 6.5–8.1)

## 2019-08-01 LAB — CBC WITH DIFFERENTIAL/PLATELET
Abs Immature Granulocytes: 0.05 10*3/uL (ref 0.00–0.07)
Basophils Absolute: 0.1 10*3/uL (ref 0.0–0.1)
Basophils Relative: 1 %
Eosinophils Absolute: 0.7 10*3/uL — ABNORMAL HIGH (ref 0.0–0.5)
Eosinophils Relative: 6 %
HCT: 49.4 % (ref 39.0–52.0)
Hemoglobin: 16 g/dL (ref 13.0–17.0)
Immature Granulocytes: 0 %
Lymphocytes Relative: 26 %
Lymphs Abs: 3 10*3/uL (ref 0.7–4.0)
MCH: 29.4 pg (ref 26.0–34.0)
MCHC: 32.4 g/dL (ref 30.0–36.0)
MCV: 90.6 fL (ref 80.0–100.0)
Monocytes Absolute: 0.9 10*3/uL (ref 0.1–1.0)
Monocytes Relative: 8 %
Neutro Abs: 6.8 10*3/uL (ref 1.7–7.7)
Neutrophils Relative %: 59 %
Platelets: 204 10*3/uL (ref 150–400)
RBC: 5.45 MIL/uL (ref 4.22–5.81)
RDW: 14.2 % (ref 11.5–15.5)
WBC: 11.5 10*3/uL — ABNORMAL HIGH (ref 4.0–10.5)
nRBC: 0 % (ref 0.0–0.2)

## 2019-08-01 NOTE — Progress Notes (Signed)
[name] OFFICE PROGRESS NOTE  Patient Care Team: [place] Pink, MD as PCP - General (Family Medicine)  SUMMARY OF ONCOLOGIC HISTORY:  Oncology History Overview Note  # AUG 2014- COLO CANCER [s/p sigmoid colectomy] STAGE III [pT1a (adeno ca in large tubulovillous adenoma- invades sub-mucosa)pN1(1/4 Ln pos)]; well diff low grade; margins- neg; on CALGB 56387 [FOLFOX x6 treatments- Oct 2014-Dec 2014]- Study drug x 3 years [Oct 2018] placebo Vs.celexocib. Aug 2015- Colo [Dr.Elliot] Tubular adenoma x 2 ; CT march 2017- NED; CT OCT 2017- NED; colo- oct 2017 [repeat in 2019]  # LEFT KIDNEY cysts/ exophytic lesions <cm- STABLE; cont f/u on CT scan.   # Hx defib; DM  # OCT 2018- MMR-STABLE.   DIAGNOSIS: COLON CANCER  STAGE:   III      ;GOALS: cure  CURRENT/MOST RECENT THERAPY: surveillance    Cancer of sigmoid colon ([place])     INTERVAL HISTORY: [name] [name] 63 y.o. male, very pleasant patient, with above history of stage III colon cancer, on protocol CALGB 56433, returns to clinic today for follow-up.  He was last seen by Dr.Brahmanday in June.  CEA at that time was stable at 1.3.  Last colonoscopy was 09/21/2018 with Dr. Vira Agar. He believes recommended follow up was 3 years or in 2022.   Patient states his diabetes has been poorly controlled.  He continues to be on insulin and oral hypoglycemic agents. He denies any nausea vomiting but denies abdominal pain.  No chest pain or shortness of breath or cough.  No blood in stools or black or stools.   Review of Systems  Constitutional: Negative for chills, diaphoresis, fever, malaise/fatigue and weight loss.  HENT: Negative for nosebleeds and sore throat.   Eyes: Negative for double vision.  Respiratory: Negative for cough, hemoptysis, sputum production, shortness of breath and wheezing.   Cardiovascular: Negative for chest pain, palpitations, orthopnea and leg swelling.  Gastrointestinal: Negative for abdominal  pain, blood in stool, constipation, diarrhea, heartburn, melena, nausea and vomiting.  Genitourinary: Negative for dysuria, frequency and urgency.  Musculoskeletal: Negative for back pain and joint pain.  Skin: Negative.  Negative for itching and rash.  Neurological: Positive for tingling. Negative for dizziness, focal weakness, weakness and headaches.  Endo/Heme/Allergies: Does not bruise/bleed easily.  Psychiatric/Behavioral: Negative for depression. The patient is not nervous/anxious and does not have insomnia.     PAST MEDICAL HISTORY :  Past Medical History:  Diagnosis Date  . 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  . Cardiac arrhythmia 06/22/2013  . Cardiomyopathy ([place]) 06/22/2013  . CHF (congestive heart failure) ([place])   . Colon cancer [facility]) 2014   Partial Colon Resection.  . Diabetes mellitus without complication ([place])    Patient takes Metformin  . Hyperlipidemia   . Hypertension   . Neck pain on right side 07/16/2015    PAST SURGICAL HISTORY :   Past Surgical History:  Procedure Laterality Date  . Biventricular implantable cardioverter-defbrillator Left 04/27/2014   Medtronic biventricular ICD model Consulta CRTD [place], serial [id number]. The right atrial lead is Medtronic [id number], serial # [id number].  The right ventrcular lead is Medtronic [id number], serial # [id number] and the LV lead is [id number] Medtronic serial # [id number]  . BLADDER SURGERY N/A 06/22/2013   "bladder stretched"  . CARDIAC CATHETERIZATION    . CHOLECYSTECTOMY  01/22/2014   Dr. Tamala Julian  . COLECTOMY Left 05/25/2013   sigmoid colectomy  . COLONOSCOPY WITH PROPOFOL N/A  08/03/2016   Procedure: COLONOSCOPY WITH PROPOFOL;  Surgeon: Manya Silvas, MD;  Location: [facility] ENDOSCOPY;  Service: Endoscopy;  Laterality: N/A;  . COLONOSCOPY WITH PROPOFOL N/A 09/21/2018   Procedure: COLONOSCOPY WITH PROPOFOL;  Surgeon: Manya Silvas, MD;  Location[facility]  [facility] END[facility]COPY;  Service: Endoscopy;  Laterality: N/A;  . INSERTION CENTRAL VENOUS ACCESS DEVICE W/ SUBCUTANEOUS PORT  2014   Dr. Tamala Julian  . ROTATOR CUFF REPAIR Left 08/2003    FAMILY HISTORY :  Mom- breast/colon; mat- aunt- breast. No bladder /stomach cancer.  Family History  Problem Relation Age of Onset  . Cancer Mother   . Diabetes Father   . Stroke Father   . Sleep apnea Sister   . Cancer Maternal Aunt     SOCIAL HISTORY:   Social History   Tobacco Use  . Smoking status: Never Smoker  . Smokeless tobacco: Never Used  Substance Use Topics  . Alcohol use: No  . Drug use: No    ALLERGIES:  is allergic to no known allergies.  MEDICATIONS:  Current Outpatient Medications  Medication Sig Dispense Refill  . canagliflozin (INVOKANA) 300 MG TABS tablet Take 300 mg by mouth daily before breakfast.    . carvedilol (COREG) 3.125 MG tablet Take 3.125 mg by mouth 2 (two) times daily with a meal.     . dapagliflozin propanediol (FARXIGA) 10 MG TABS tablet Take 10 mg by mouth daily.    . furosemide (LASIX) 20 MG tablet Take 10 mg by mouth daily as needed.     Marland Kitchen glimepiride (AMARYL) 1 MG tablet Take 2 mg by mouth 2 (two) times daily. Take 2 tablets twice daily    . glucose blood (ONE TOUCH ULTRA TEST) test strip Use 2 (two) times daily. Use as instructed.    . insulin aspart protamine - aspart (NOVOLOG 70/30 MIX) (70-30) 100 UNIT/ML FlexPen Take 45 units before breakfast. Take 55 units before supper. Always take BEFORE the meals.    . meloxicam (MOBIC) 15 MG tablet Take 15 mg by mouth daily.    . metFORMIN (GLUCOPHAGE) 500 MG tablet Take 1,000 mg by mouth 2 (two) times daily with a meal.    . olmesartan-hydrochlorothiazide (BENICAR HCT) 20-12.5 MG per tablet Take 0.5 tablets by mouth daily.    . simvastatin (ZOCOR) 40 MG tablet Take 40 mg by mouth daily. Once daily at bedtime    . sitaGLIPtin (JANUVIA) 100 MG tablet Take 100 mg by mouth 1 day or 1 dose. In the morning     No current  facility-administered medications for this visit.     PHYSICAL EXAMINATION: ECOG PERFORMANCE STATUS: 0 - Asymptomatic  BP 106/79 (BP Location: Left Arm, Patient Position: Sitting, Cuff Size: Large)   Pulse 82   Temp 97.8 F (36.6 C) (Tympanic)   Resp 20   Ht _0  (1.753 m)   Wt 245 lb (111.1 kg)   BMI 36.18 kg/m   Filed Weights   07/31/19 1445 08/01/19 1322  Weight: 245 lb (111.1 kg) 245 lb (111.1 kg)   Physical Exam  Constitutional: He is oriented to person, place, and time and well-developed, well-nourished, and in no distress.  HENT:  Head: Normocephalic and atraumatic.  Mouth/Throat: Oropharynx is clear and moist. No oropharyngeal exudate.  Eyes: Conjunctivae are normal. No scleral icterus.  Neck: Normal range of motion. Neck supple.  Cardiovascular: Normal rate and regular rhythm.  Pulmonary/Chest: Effort normal and breath sounds normal. No respiratory distress. He has no wheezes.  Abdominal:  Soft. Bowel sounds are normal. He exhibits no distension and no mass. There is no abdominal tenderness. There is no rebound and no guarding.  Musculoskeletal: Normal range of motion.        General: No tenderness or edema.  Lymphadenopathy:    He has no cervical adenopathy.  Neurological: He is alert and oriented to person, place, and time.  Skin: Skin is warm and dry.  Psychiatric: Affect normal.    LABORATORY DATA:  I have reviewed the data as listed    Component Value Date/Time   NA 139 08/01/2019 1258   NA 132 (L) 01/07/2015 1052   K 4.3 08/01/2019 1258   K 4.3 01/07/2015 1052   CL 105 08/01/2019 1258   CL 101 01/07/2015 1052   CO2 23 08/01/2019 1258   CO2 23 01/07/2015 1052   GLUCOSE 200 (H) 08/01/2019 1258   GLUCOSE 404 (H) 01/07/2015 1052   BUN 13 08/01/2019 1258   BUN 14 01/07/2015 1052   CREATININE 1.09 08/01/2019 1258   CREATININE 0.99 01/07/2015 1052   CALCIUM 9.6 08/01/2019 1258   CALCIUM 9.2 01/07/2015 1052   PROT 8.0 08/01/2019 1258   PROT 7.7  01/07/2015 1052   ALBUMIN 4.3 08/01/2019 1258   ALBUMIN 4.3 01/07/2015 1052   AST 32 08/01/2019 1258   AST 42 (H) 01/07/2015 1052   ALT 32 08/01/2019 1258   ALT 51 01/07/2015 1052   ALKPHOS 47 08/01/2019 1258   ALKPHOS 64 01/07/2015 1052   BILITOT 1.0 08/01/2019 1258   BILITOT 1.0 01/07/2015 1052   GFRNONAA >60 08/01/2019 1258   GFRNONAA >60 01/07/2015 1052   GFRAA >60 08/01/2019 1258   GFRAA >60 01/07/2015 1052    No results found for: SPEP, UPEP  Lab Results  Component Value Date   WBC 11.5 (H) 08/01/2019   NEUTROABS 6.8 08/01/2019   HGB 16.0 08/01/2019   HCT 49.4 08/01/2019   MCV 90.6 08/01/2019   PLT 204 08/01/2019      Chemistry      Component Value Date/Time   NA 139 08/01/2019 1258   NA 132 (L) 01/07/2015 1052   K 4.3 08/01/2019 1258   K 4.3 01/07/2015 1052   CL 105 08/01/2019 1258   CL 101 01/07/2015 1052   CO2 23 08/01/2019 1258   CO2 23 01/07/2015 1052   BUN 13 08/01/2019 1258   BUN 14 01/07/2015 1052   CREATININE 1.09 08/01/2019 1258   CREATININE 0.99 01/07/2015 1052      Component Value Date/Time   CALCIUM 9.6 08/01/2019 1258   CALCIUM 9.2 01/07/2015 1052   ALKPHOS 47 08/01/2019 1258   ALKPHOS 64 01/07/2015 1052   AST 32 08/01/2019 1258   AST 42 (H) 01/07/2015 1052   ALT 32 08/01/2019 1258   ALT 51 01/07/2015 1052   BILITOT 1.0 08/01/2019 1258   BILITOT 1.0 01/07/2015 1052      ASSESSMENT & PLAN:   Cancer of sigmoid colon [facility]) # Colon Cancer- stage III- on CALGB 16109; intolerance to celecoxib/placebo discontinued in Oct 2016 secondary to elevated blood sugars. Stable.   # CT in October 2019 showed no evidence of disease. December 2019 colonoscopy negative. Clinically stable without evidence of recurrence. CEA 1.2 today. Discussed findings with clinical trials RNs who agree with recommendation to proceed with CT imaging.   # AKI- creatinine 1.09 today. (Previously 1.84/K 6.1/Protein 8.4). Etiology unclear but appears to have resolved.  Continue to monitor.   # Diabetes Mellitus- poor control- hemoglobin  A1c May 2020 was 9.3. Glucose today 200. Encouraged continued follow up with Dr. Gabriel Carina.   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 Stable & unchanged.     Disposition:  CT Chest/Abodmen/Pelvis  RTC in 6 months for labs (cbc, cmp, cea) and follow up with Dr. Ria Comment, [place], AGNP-C [place] at [facility] [phone] (clinic)  CC: Dr. Rogue Bussing

## 2019-08-02 LAB — CEA: CEA: 1.2 ng/mL (ref 0.0–4.7)

## 2019-08-10 ENCOUNTER — Other Ambulatory Visit: Payer: Self-pay

## 2019-08-10 ENCOUNTER — Ambulatory Visit
Admission: RE | Admit: 2019-08-10 | Discharge: 2019-08-10 | Disposition: A | Discharge status: Home / Self Care | Payer: Commercial Managed Care - PPO | Source: Ambulatory Visit | Attending: Nurse Practitioner | Admitting: Nurse Practitioner

## 2019-08-10 DIAGNOSIS — C187 Malignant neoplasm of sigmoid colon: Secondary | ICD-10-CM | POA: Diagnosis not present

## 2019-08-10 MED ORDER — IOHEXOL 300 MG/ML  SOLN
100.0000 mL | Freq: Once | INTRAMUSCULAR | Status: AC | PRN
  Administered 2019-08-10: 11:00:00 100 mL via INTRAVENOUS

## 2019-08-16 NOTE — Assessment & Plan Note (Signed)
#   Colon Cancer- stage III- on CALGB 03474; intolerance to celecoxib/placebo discontinued in Oct 2016 secondary to elevated blood sugars. Stable.   # CT in October 2019 showed no evidence of disease. December 2019 colonoscopy negative. Clinically stable without evidence of recurrence. CEA 1.2 today. Discussed findings with clinical trials RNs who agree with recommendation to proceed with CT imaging.   # AKI- creatinine 1.09 today. (Previously 1.84/K 6.1/Protein 8.4). Etiology unclear but appears to have resolved. Continue to monitor.   # Diabetes Mellitus- poor control- hemoglobin A1c May 2020 was 9.3. Glucose today 200. Encouraged continued follow up with Dr. Gabriel Carina.   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 Stable & unchanged.     Disposition:  CT Chest/Abodmen/Pelvis  RTC in 6 months for labs (cbc, cmp, cea) and follow up with Dr. Rogue Bussing

## 2020-01-30 ENCOUNTER — Inpatient Hospital Stay: Payer: Commercial Managed Care - PPO | Attending: Internal Medicine

## 2020-01-30 ENCOUNTER — Inpatient Hospital Stay: Payer: Commercial Managed Care - PPO | Admitting: Internal Medicine

## 2020-01-30 NOTE — Progress Notes (Deleted)
[place] OFFICE PROGRESS NOTE  Patient Care Team: [place] Pink, MD as PCP - General (Family Medicine)   SUMMARY OF ONCOLOGIC HISTORY:  Oncology History Overview Note  # AUG 2014- COLO CANCER [s/p sigmoid colectomy] STAGE III [pT1a (adeno ca in large tubulovillous adenoma- invades sub-mucosa)pN1(1/4 Ln pos)]; well diff low grade; margins- neg; on CALGB 25366 [FOLFOX x6 treatments- Oct 2014-Dec 2014]- Study drug x 3 years [Oct 2018] placebo Vs.celexocib. Aug 2015- Colo [Dr.Elliot] Tubular adenoma x 2 ; CT march 2017- NED; CT OCT 2017- NED; colo- oct 2017 [repeat in 2019]  # LEFT KIDNEY cysts/ exophytic lesions <cm- STABLE; cont f/u on CT scan.   # Hx defib; DM  # OCT 2018- MMR-STABLE.   DIAGNOSIS: COLON CANCER  STAGE:   III      ;GOALS: cure  CURRENT/MOST RECENT THERAPY: surveillance    Cancer of sigmoid colon ([place])       INTERVAL HISTORY:  A very pleasant 64 -year-old male patient with above history of colon cancer stage III on protocol CALGB 44034- is here for follow-up.  Patient states his diabetes has been poorly.  He continues to be on insulin and oral hypoglycemic agents.  He denies any nausea vomiting but denies abdominal pain.  No chest pain or shortness of breath or cough.  No blood in stools or black or stools.  Patient denies any nausea vomiting abdominal pain.  He is awaiting to have a colonoscopy again in December 2019.  No blood in stools  Review of Systems  Constitutional: Negative for chills, diaphoresis, fever, malaise/fatigue and weight loss.  HENT: Negative for nosebleeds and sore throat.   Eyes: Negative for double vision.  Respiratory: Negative for cough, hemoptysis, sputum production, shortness of breath and wheezing.   Cardiovascular: Negative for chest pain, palpitations, orthopnea and leg swelling.  Gastrointestinal: Negative for abdominal pain, blood in stool, constipation, diarrhea, heartburn, melena, nausea and vomiting.   Genitourinary: Negative for dysuria, frequency and urgency.  Musculoskeletal: Negative for back pain and joint pain.  Skin: Negative.  Negative for itching and rash.  Neurological: Positive for tingling. Negative for dizziness, focal weakness, weakness and headaches.  Endo/Heme/Allergies: Does not bruise/bleed easily.  Psychiatric/Behavioral: Negative for depression. The patient is not nervous/anxious and does not have insomnia.      PAST MEDICAL HISTORY :  Past Medical History:  Diagnosis Date  . 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  . Cardiac arrhythmia 06/22/2013  . Cardiomyopathy ([place]) 06/22/2013  . CHF (congestive heart failure) ([place])   . Colon cancer [facility]) 2014   Partial Colon Resection.  . Diabetes mellitus without complication ([place])    Patient takes Metformin  . Hyperlipidemia   . Hypertension   . Neck pain on right side 07/16/2015    PAST SURGICAL HISTORY :   Past Surgical History:  Procedure Laterality Date  . Biventricular implantable cardioverter-defbrillator Left 04/27/2014   Medtronic biventricular ICD model Consulta CRTD [place], serial [id number]. The right atrial lead is Medtronic [id number], serial # [id number].  The right ventrcular lead is Medtronic [id number], serial # [id number] and the LV lead is 4196 Medtronic serial # [id number]  . BLADDER SURGERY N/A 06/22/2013   "bladder stretched"  . CARDIAC CATHETERIZATION    . CHOLECYSTECTOMY  01/22/2014   Dr. Tamala Julian  . COLECTOMY Left 05/25/2013   sigmoid colectomy  . COLONOSCOPY WITH PROPOFOL N/A 08/03/2016   Procedure: COLONOSCOPY WITH PROPOFOL;  Surgeon: Manya Silvas, MD;  Location: [facility] ENDOSCOPY;  Service: Endoscopy;  Laterality: N/A;  . COLONOSCOPY WITH PROPOFOL N/A 09/21/2018   Procedure: COLONOSCOPY WITH PROPOFOL;  Surgeon: Manya Silvas, MD;  Location: [facility] ENDOSCOPY;  Service: Endoscopy;  Laterality: N/A;  . INSERTION CENTRAL VENOUS  ACCESS DEVICE W/ SUBCUTANEOUS PORT  2014   Dr. Tamala Julian  . ROTATOR CUFF REPAIR Left 08/2003    FAMILY HISTORY :  Mom- breast/colon; mat- aunt- breast. No bladder /stomach cancer.  Family History  Problem Relation Age of Onset  . Cancer Mother   . Diabetes Father   . Stroke Father   . Sleep apnea Sister   . Cancer Maternal Aunt     SOCIAL HISTORY:   Social History   Tobacco Use  . Smoking status: Never Smoker  . Smokeless tobacco: Never Used  Substance Use Topics  . Alcohol use: No  . Drug use: No    ALLERGIES:  is allergic to no known allergies.  MEDICATIONS:  Current Outpatient Medications  Medication Sig Dispense Refill  . canagliflozin (INVOKANA) 300 MG TABS tablet Take 300 mg by mouth daily before breakfast.    . carvedilol (COREG) 3.125 MG tablet Take 3.125 mg by mouth 2 (two) times daily with a meal.     . furosemide (LASIX) 20 MG tablet Take 10 mg by mouth daily as needed.     Marland Kitchen glimepiride (AMARYL) 1 MG tablet Take 2 mg by mouth 2 (two) times daily. Take 2 tablets twice daily    . glucose blood (ONE TOUCH ULTRA TEST) test strip Use 2 (two) times daily. Use as instructed.    . insulin aspart protamine - aspart (NOVOLOG 70/30 MIX) (70-30) 100 UNIT/ML FlexPen Take 45 units before breakfast. Take 55 units before supper. Always take BEFORE the meals.    . meloxicam (MOBIC) 15 MG tablet Take 15 mg by mouth daily.    . metFORMIN (GLUCOPHAGE) 500 MG tablet Take 1,000 mg by mouth 2 (two) times daily with a meal.    . olmesartan-hydrochlorothiazide (BENICAR HCT) 20-12.5 MG per tablet Take 0.5 tablets by mouth daily.    . simvastatin (ZOCOR) 40 MG tablet Take 40 mg by mouth daily. Once daily at bedtime    . sitaGLIPtin (JANUVIA) 100 MG tablet Take 100 mg by mouth 1 day or 1 dose. In the morning     No current facility-administered medications for this visit.    PHYSICAL EXAMINATION: ECOG PERFORMANCE STATUS: 0 - Asymptomatic  There were no vitals taken for this  visit.  There were no vitals filed for this visit.  Physical Exam  Constitutional: He is oriented to person, place, and time and well-developed, well-nourished, and in no distress.  HENT:  Head: Normocephalic and atraumatic.  Mouth/Throat: Oropharynx is clear and moist. No oropharyngeal exudate.  Eyes: Pupils are equal, round, and reactive to light.  Cardiovascular: Normal rate and regular rhythm.  Pulmonary/Chest: No respiratory distress. He has no wheezes.  Abdominal: Soft. Bowel sounds are normal. He exhibits no distension and no mass. There is no abdominal tenderness. There is no rebound and no guarding.  Musculoskeletal:        General: No tenderness or edema. Normal range of motion.     Cervical back: Normal range of motion and neck supple.  Neurological: He is alert and oriented to person, place, and time.  Skin: Skin is warm.  Psychiatric: Affect normal.    LABORATORY DATA:  I have reviewed the data as listed    Component Value Date/Time  NA 139 08/01/2019 1258   NA 132 (L) 01/07/2015 1052   K 4.3 08/01/2019 1258   K 4.3 01/07/2015 1052   CL 105 08/01/2019 1258   CL 101 01/07/2015 1052   CO2 23 08/01/2019 1258   CO2 23 01/07/2015 1052   GLUCOSE 200 (H) 08/01/2019 1258   GLUCOSE 404 (H) 01/07/2015 1052   BUN 13 08/01/2019 1258   BUN 14 01/07/2015 1052   CREATININE 1.09 08/01/2019 1258   CREATININE 0.99 01/07/2015 1052   CALCIUM 9.6 08/01/2019 1258   CALCIUM 9.2 01/07/2015 1052   PROT 8.0 08/01/2019 1258   PROT 7.7 01/07/2015 1052   ALBUMIN 4.3 08/01/2019 1258   ALBUMIN 4.3 01/07/2015 1052   AST 32 08/01/2019 1258   AST 42 (H) 01/07/2015 1052   ALT 32 08/01/2019 1258   ALT 51 01/07/2015 1052   ALKPHOS 47 08/01/2019 1258   ALKPHOS 64 01/07/2015 1052   BILITOT 1.0 08/01/2019 1258   BILITOT 1.0 01/07/2015 1052   GFRNONAA >60 08/01/2019 1258   GFRNONAA >60 01/07/2015 1052   GFRAA >60 08/01/2019 1258   GFRAA >60 01/07/2015 1052    No results found for:  SPEP, UPEP  Lab Results  Component Value Date   WBC 11.5 (H) 08/01/2019   NEUTROABS 6.8 08/01/2019   HGB 16.0 08/01/2019   HCT 49.4 08/01/2019   MCV 90.6 08/01/2019   PLT 204 08/01/2019      Chemistry      Component Value Date/Time   NA 139 08/01/2019 1258   NA 132 (L) 01/07/2015 1052   K 4.3 08/01/2019 1258   K 4.3 01/07/2015 1052   CL 105 08/01/2019 1258   CL 101 01/07/2015 1052   CO2 23 08/01/2019 1258   CO2 23 01/07/2015 1052   BUN 13 08/01/2019 1258   BUN 14 01/07/2015 1052   CREATININE 1.09 08/01/2019 1258   CREATININE 0.99 01/07/2015 1052      Component Value Date/Time   CALCIUM 9.6 08/01/2019 1258   CALCIUM 9.2 01/07/2015 1052   ALKPHOS 47 08/01/2019 1258   ALKPHOS 64 01/07/2015 1052   AST 32 08/01/2019 1258   AST 42 (H) 01/07/2015 1052   ALT 32 08/01/2019 1258   ALT 51 01/07/2015 1052   BILITOT 1.0 08/01/2019 1258   BILITOT 1.0 01/07/2015 1052         ASSESSMENT & PLAN:   No problem-specific Assessment & Plan notes found for this encounter.  # 25 minutes face-to-face with the patient discussing the above plan of care; more than 50% of time spent on prognosis/ natural history; counseling and coordination.    Cammie Sickle, MD 01/30/2020 1:13 PM

## 2020-03-19 ENCOUNTER — Inpatient Hospital Stay (HOSPITAL_BASED_OUTPATIENT_CLINIC_OR_DEPARTMENT_OTHER): Payer: Commercial Managed Care - PPO | Admitting: Internal Medicine

## 2020-03-19 ENCOUNTER — Other Ambulatory Visit: Payer: Self-pay

## 2020-03-19 ENCOUNTER — Encounter: Payer: Self-pay | Admitting: Internal Medicine

## 2020-03-19 ENCOUNTER — Inpatient Hospital Stay: Payer: Commercial Managed Care - PPO | Attending: Internal Medicine

## 2020-03-19 DIAGNOSIS — Z9049 Acquired absence of other specified parts of digestive tract: Secondary | ICD-10-CM | POA: Insufficient documentation

## 2020-03-19 DIAGNOSIS — Z833 Family history of diabetes mellitus: Secondary | ICD-10-CM | POA: Diagnosis not present

## 2020-03-19 DIAGNOSIS — Z823 Family history of stroke: Secondary | ICD-10-CM | POA: Diagnosis not present

## 2020-03-19 DIAGNOSIS — Z809 Family history of malignant neoplasm, unspecified: Secondary | ICD-10-CM | POA: Insufficient documentation

## 2020-03-19 DIAGNOSIS — Z79899 Other long term (current) drug therapy: Secondary | ICD-10-CM | POA: Diagnosis not present

## 2020-03-19 DIAGNOSIS — I959 Hypotension, unspecified: Secondary | ICD-10-CM | POA: Diagnosis not present

## 2020-03-19 DIAGNOSIS — R42 Dizziness and giddiness: Secondary | ICD-10-CM | POA: Insufficient documentation

## 2020-03-19 DIAGNOSIS — C187 Malignant neoplasm of sigmoid colon: Secondary | ICD-10-CM | POA: Insufficient documentation

## 2020-03-19 DIAGNOSIS — E1142 Type 2 diabetes mellitus with diabetic polyneuropathy: Secondary | ICD-10-CM | POA: Insufficient documentation

## 2020-03-19 DIAGNOSIS — R5383 Other fatigue: Secondary | ICD-10-CM | POA: Insufficient documentation

## 2020-03-19 DIAGNOSIS — Z836 Family history of other diseases of the respiratory system: Secondary | ICD-10-CM | POA: Insufficient documentation

## 2020-03-19 LAB — CBC WITH DIFFERENTIAL/PLATELET
Abs Immature Granulocytes: 0.06 10*3/uL (ref 0.00–0.07)
Basophils Absolute: 0.1 10*3/uL (ref 0.0–0.1)
Basophils Relative: 1 %
Eosinophils Absolute: 0.4 10*3/uL (ref 0.0–0.5)
Eosinophils Relative: 4 %
HCT: 46.9 % (ref 39.0–52.0)
Hemoglobin: 16 g/dL (ref 13.0–17.0)
Immature Granulocytes: 1 %
Lymphocytes Relative: 25 %
Lymphs Abs: 3 10*3/uL (ref 0.7–4.0)
MCH: 30.8 pg (ref 26.0–34.0)
MCHC: 34.1 g/dL (ref 30.0–36.0)
MCV: 90.2 fL (ref 80.0–100.0)
Monocytes Absolute: 0.7 10*3/uL (ref 0.1–1.0)
Monocytes Relative: 6 %
Neutro Abs: 7.5 10*3/uL (ref 1.7–7.7)
Neutrophils Relative %: 63 %
Platelets: 209 10*3/uL (ref 150–400)
RBC: 5.2 MIL/uL (ref 4.22–5.81)
RDW: 13.2 % (ref 11.5–15.5)
WBC: 11.8 10*3/uL — ABNORMAL HIGH (ref 4.0–10.5)
nRBC: 0 % (ref 0.0–0.2)

## 2020-03-19 LAB — COMPREHENSIVE METABOLIC PANEL
ALT: 31 U/L (ref 0–44)
AST: 29 U/L (ref 15–41)
Albumin: 4.2 g/dL (ref 3.5–5.0)
Alkaline Phosphatase: 52 U/L (ref 38–126)
Anion gap: 8 (ref 5–15)
BUN: 13 mg/dL (ref 8–23)
CO2: 26 mmol/L (ref 22–32)
Calcium: 10.2 mg/dL (ref 8.9–10.3)
Chloride: 105 mmol/L (ref 98–111)
Creatinine, Ser: 1.21 mg/dL (ref 0.61–1.24)
GFR calc Af Amer: >60 mL/min (ref >60)
GFR calc non Af Amer: >60 mL/min (ref >60)
Glucose, Bld: 161 mg/dL — ABNORMAL HIGH (ref 70–99)
Potassium: 4.6 mmol/L (ref 3.5–5.1)
Sodium: 139 mmol/L (ref 135–145)
Total Bilirubin: 1 mg/dL (ref 0.3–1.2)
Total Protein: 8 g/dL (ref 6.5–8.1)

## 2020-03-19 NOTE — Assessment & Plan Note (Addendum)
#   Colon Cancer- stage III- on CALGB 41282; intolerance to celecoxib/placebo discontinued in Oct 2016 secondary to elevated blood sugars. STABLE.   # CT in Nov 2020- showed no evidence of disease. December 2019 colonoscopy negative. Clinically stable without evidence of recurrence. CEA 1.2 today.  Continue monitoring on a clinical basis.  # Diabetes Mellitus- post [id number].  Improved as per patient.  Continue follow-up with endocrinology.  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- STABLE.    # Pre-syncope/ fall- ? Hypotension; taken off lasix by cardiology; BP- today systolic 99- STOP olmesartan-hctz; continue Coreg.  Pt awaiting follow up with PCP.   Disposition:  # Follow up in 6 months-MD; labs (cbc, cmp, cea)-Dr. B  Dr.Fath/Dr.Hedrick

## 2020-03-19 NOTE — Progress Notes (Signed)
[facility] OFFICE PROGRESS NOTE  Patient Care Team: [place] Pink, MD as PCP - General (Family Medicine)   SUMMARY OF ONCOLOGIC HISTORY:  Oncology History Overview Note  # AUG 2014- COLO CANCER [s/p sigmoid colectomy] STAGE III [pT1a (adeno ca in large tubulovillous adenoma- invades sub-mucosa)pN1(1/4 Ln pos)]; well diff low grade; margins- neg; on CALGB 16109 [FOLFOX x6 treatments- Oct 2014-Dec 2014]- Study drug x 3 years [Oct 2018] placebo Vs.celexocib. Aug 2015- Colo [Dr.Elliot] Tubular adenoma x 2 ; CT march 2017- NED; CT OCT 2017- NED; colo- oct 2017 [repeat in 2019]  # LEFT KIDNEY cysts/ exophytic lesions <cm- STABLE; cont f/u on CT scan.   # Hx defib; DM  # OCT 2018- MMR-STABLE.   DIAGNOSIS: COLON CANCER  STAGE:   III      ;GOALS: cure  CURRENT/MOST RECENT THERAPY: surveillance    Cancer of sigmoid colon ([place])       INTERVAL HISTORY:  A very pleasant 64 -year-old male patient with above history of colon cancer stage III on protocol CALGB 60454- is here for follow-up.  Presyncope- 5-6 weeks ago; standing in shower. BG at that time- "normal" per pt. Seen cardiology- taken off Lasix.  He continues to feel fatigued.  Continues to feel lightheaded.   No blood in stools no black or stools.  No nausea no vomiting.  No abdominal pain.  Review of Systems  Constitutional: Positive for malaise/fatigue. Negative for chills, diaphoresis, fever and weight loss.  HENT: Negative for nosebleeds and sore throat.   Eyes: Negative for double vision.  Respiratory: Negative for cough, hemoptysis, sputum production, shortness of breath and wheezing.   Cardiovascular: Negative for chest pain, palpitations, orthopnea and leg swelling.  Gastrointestinal: Negative for abdominal pain, blood in stool, constipation, diarrhea, heartburn, melena, nausea and vomiting.  Genitourinary: Negative for dysuria, frequency and urgency.  Musculoskeletal: Negative for back pain and  joint pain.  Skin: Negative.  Negative for itching and rash.  Neurological: Positive for dizziness and tingling. Negative for focal weakness, weakness and headaches.  Endo/Heme/Allergies: Does not bruise/bleed easily.  Psychiatric/Behavioral: Negative for depression. The patient is not nervous/anxious and does not have insomnia.      PAST MEDICAL HISTORY :  Past Medical History:  Diagnosis Date  . 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  . Cardiac arrhythmia 06/22/2013  . Cardiomyopathy ([place]) 06/22/2013  . CHF (congestive heart failure) ([place])   . Colon cancer [facility]) 2014   Partial Colon Resection.  . Diabetes mellitus without complication ([place])    Patient takes Metformin  . Hyperlipidemia   . Hypertension   . Neck pain on right side 07/16/2015    PAST SURGICAL HISTORY :   Past Surgical History:  Procedure Laterality Date  . Biventricular implantable cardioverter-defbrillator Left 04/27/2014   Medtronic biventricular ICD model Consulta CRTD [place], serial [id number]. The right atrial lead is Medtronic [id number], serial # [id number].  The right ventrcular lead is Medtronic [id number], serial # [id number] and the LV lead is 4196 Medtronic serial # [id number]  . BLADDER SURGERY N/A 06/22/2013   "bladder stretched"  . CARDIAC CATHETERIZATION    . CHOLECYSTECTOMY  01/22/2014   Dr. Tamala Julian  . COLECTOMY Left 05/25/2013   sigmoid colectomy  . COLONOSCOPY WITH PROPOFOL N/A 08/03/2016   Procedure: COLONOSCOPY WITH PROPOFOL;  Surgeon: Manya Silvas, MD;  Location: [facility] ENDOSCOPY;  Service: Endoscopy;  Laterality: N/A;  . COLONOSCOPY WITH PROPOFOL N/A 09/21/2018   Procedure:  COLONOSCOPY WITH PROPOFOL;  Surgeon: Manya Silvas, MD;  Location: [facility] ENDOSCOPY;  Service: Endoscopy;  Laterality: N/A;  . INSERTION CENTRAL VENOUS ACCESS DEVICE W/ SUBCUTANEOUS PORT  2014   Dr. Tamala Julian  . ROTATOR CUFF REPAIR Left 08/2003    FAMILY  HISTORY :  Mom- breast/colon; mat- aunt- breast. No bladder /stomach cancer.  Family History  Problem Relation Age of Onset  . Cancer Mother   . Diabetes Father   . Stroke Father   . Sleep apnea Sister   . Cancer Maternal Aunt     SOCIAL HISTORY:   Social History   Tobacco Use  . Smoking status: Never Smoker  . Smokeless tobacco: Never Used  Substance Use Topics  . Alcohol use: No  . Drug use: No    ALLERGIES:  is allergic to no known allergies.  MEDICATIONS:  Current Outpatient Medications  Medication Sig Dispense Refill  . canagliflozin (INVOKANA) 300 MG TABS tablet Take 300 mg by mouth daily before breakfast.    . carvedilol (COREG) 3.125 MG tablet Take 3.125 mg by mouth 2 (two) times daily with a meal.     . glimepiride (AMARYL) 1 MG tablet Take 2 mg by mouth 2 (two) times daily. Take 2 tablets twice daily    . glucose blood (ONE TOUCH ULTRA TEST) test strip Use 2 (two) times daily. Use as instructed.    . insulin aspart protamine - aspart (NOVOLOG 70/30 MIX) (70-30) 100 UNIT/ML FlexPen Take 45 units before breakfast. Take 55 units before supper. Always take BEFORE the meals.    . meloxicam (MOBIC) 15 MG tablet Take 15 mg by mouth daily.    . metFORMIN (GLUCOPHAGE) 500 MG tablet Take 1,000 mg by mouth 2 (two) times daily with a meal.    . olmesartan-hydrochlorothiazide (BENICAR HCT) 20-12.5 MG per tablet Take 0.5 tablets by mouth daily.    . simvastatin (ZOCOR) 40 MG tablet Take 40 mg by mouth daily. Once daily at bedtime    . sitaGLIPtin (JANUVIA) 100 MG tablet Take 100 mg by mouth 1 day or 1 dose. In the morning    . furosemide (LASIX) 20 MG tablet Take 10 mg by mouth daily as needed.  (Patient not taking: Reported on 03/19/2020)     No current facility-administered medications for this visit.    PHYSICAL EXAMINATION: ECOG PERFORMANCE STATUS: 0 - Asymptomatic  BP 97/66   Pulse 78   Temp 98.4 F (36.9 C) (Oral)   Ht _0  (1.753 m)   Wt 246 lb (111.6 kg)   BMI  36.33 kg/m   Filed Weights   03/19/20 0959  Weight: 246 lb (111.6 kg)    Physical Exam HENT:     Head: Normocephalic and atraumatic.     Mouth/Throat:     Pharynx: No oropharyngeal exudate.  Eyes:     Pupils: Pupils are equal, round, and reactive to light.  Cardiovascular:     Rate and Rhythm: Normal rate and regular rhythm.  Pulmonary:     Effort: No respiratory distress.     Breath sounds: No wheezing.  Abdominal:     General: Bowel sounds are normal. There is no distension.     Palpations: Abdomen is soft. There is no mass.     Tenderness: There is no abdominal tenderness. There is no guarding or rebound.  Musculoskeletal:        General: No tenderness. Normal range of motion.     Cervical back: Normal range of motion  and neck supple.  Skin:    General: Skin is warm.  Neurological:     Mental Status: He is alert and oriented to person, place, and time.  Psychiatric:        Mood and Affect: Affect normal.     LABORATORY DATA:  I have reviewed the data as listed    Component Value Date/Time   NA 139 03/19/2020 0931   NA 132 (L) 01/07/2015 1052   K 4.6 03/19/2020 0931   K 4.3 01/07/2015 1052   CL 105 03/19/2020 0931   CL 101 01/07/2015 1052   CO2 26 03/19/2020 0931   CO2 23 01/07/2015 1052   GLUCOSE 161 (H) 03/19/2020 0931   GLUCOSE 404 (H) 01/07/2015 1052   BUN 13 03/19/2020 0931   BUN 14 01/07/2015 1052   CREATININE 1.21 03/19/2020 0931   CREATININE 0.99 01/07/2015 1052   CALCIUM 10.2 03/19/2020 0931   CALCIUM 9.2 01/07/2015 1052   PROT 8.0 03/19/2020 0931   PROT 7.7 01/07/2015 1052   ALBUMIN 4.2 03/19/2020 0931   ALBUMIN 4.3 01/07/2015 1052   AST 29 03/19/2020 0931   AST 42 (H) 01/07/2015 1052   ALT 31 03/19/2020 0931   ALT 51 01/07/2015 1052   ALKPHOS 52 03/19/2020 0931   ALKPHOS 64 01/07/2015 1052   BILITOT 1.0 03/19/2020 0931   BILITOT 1.0 01/07/2015 1052   GFRNONAA >60 03/19/2020 0931   GFRNONAA >60 01/07/2015 1052   GFRAA >60 03/19/2020  0931   GFRAA >60 01/07/2015 1052    No results found for: SPEP, UPEP  Lab Results  Component Value Date   WBC 11.8 (H) 03/19/2020   NEUTROABS 7.5 03/19/2020   HGB 16.0 03/19/2020   HCT 46.9 03/19/2020   MCV 90.2 03/19/2020   PLT 209 03/19/2020      Chemistry      Component Value Date/Time   NA 139 03/19/2020 0931   NA 132 (L) 01/07/2015 1052   K 4.6 03/19/2020 0931   K 4.3 01/07/2015 1052   CL 105 03/19/2020 0931   CL 101 01/07/2015 1052   CO2 26 03/19/2020 0931   CO2 23 01/07/2015 1052   BUN 13 03/19/2020 0931   BUN 14 01/07/2015 1052   CREATININE 1.21 03/19/2020 0931   CREATININE 0.99 01/07/2015 1052      Component Value Date/Time   CALCIUM 10.2 03/19/2020 0931   CALCIUM 9.2 01/07/2015 1052   ALKPHOS 52 03/19/2020 0931   ALKPHOS 64 01/07/2015 1052   AST 29 03/19/2020 0931   AST 42 (H) 01/07/2015 1052   ALT 31 03/19/2020 0931   ALT 51 01/07/2015 1052   BILITOT 1.0 03/19/2020 0931   BILITOT 1.0 01/07/2015 1052         ASSESSMENT & PLAN:   Cancer of sigmoid colon [facility]) # Colon Cancer- stage III- on CALGB 34742; intolerance to celecoxib/placebo discontinued in Oct 2016 secondary to elevated blood sugars. STABLE.   # CT in Nov 2020- showed no evidence of disease. December 2019 colonoscopy negative. Clinically stable without evidence of recurrence. CEA 1.2 today.  Continue monitoring on a clinical basis.  # Diabetes Mellitus- post BF-161.  Improved as per patient.  Continue follow-up with endocrinology.  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- STABLE.    # Pre-syncope/ fall- ? Hypotension; taken off lasix by cardiology; BP- today systolic 99- STOP olmesartan-hctz; continue Coreg.  Pt awaiting follow up with PCP.   Disposition:  # Follow up in 6 months-MD;  labs (cbc, cmp, cea)-Dr. B  Dr.Fath/Dr.Hedrick  # 25 minutes face-to-face with the patient discussing the above plan of care; more than 50% of time spent on prognosis/ natural  history; counseling and coordination.    Cammie Sickle, MD 03/19/2020 10:30 AM

## 2020-03-19 NOTE — Patient Instructions (Signed)
#   STOP omlesartan-HCTZ as you blood pressures were running low/ and because of dizziness.

## 2020-03-20 LAB — CEA: CEA: 1 ng/mL (ref 0.0–4.7)

## 2020-09-17 ENCOUNTER — Encounter: Payer: Self-pay | Admitting: Internal Medicine

## 2020-09-17 ENCOUNTER — Inpatient Hospital Stay: Payer: Commercial Managed Care - PPO | Attending: Internal Medicine

## 2020-09-17 ENCOUNTER — Inpatient Hospital Stay (HOSPITAL_BASED_OUTPATIENT_CLINIC_OR_DEPARTMENT_OTHER): Payer: Commercial Managed Care - PPO | Admitting: Internal Medicine

## 2020-09-17 DIAGNOSIS — Z836 Family history of other diseases of the respiratory system: Secondary | ICD-10-CM | POA: Insufficient documentation

## 2020-09-17 DIAGNOSIS — Z809 Family history of malignant neoplasm, unspecified: Secondary | ICD-10-CM | POA: Insufficient documentation

## 2020-09-17 DIAGNOSIS — Z833 Family history of diabetes mellitus: Secondary | ICD-10-CM | POA: Diagnosis not present

## 2020-09-17 DIAGNOSIS — C187 Malignant neoplasm of sigmoid colon: Secondary | ICD-10-CM

## 2020-09-17 DIAGNOSIS — Z9049 Acquired absence of other specified parts of digestive tract: Secondary | ICD-10-CM | POA: Diagnosis not present

## 2020-09-17 DIAGNOSIS — Z85038 Personal history of other malignant neoplasm of large intestine: Secondary | ICD-10-CM | POA: Insufficient documentation

## 2020-09-17 DIAGNOSIS — Z823 Family history of stroke: Secondary | ICD-10-CM | POA: Diagnosis not present

## 2020-09-17 DIAGNOSIS — Z79899 Other long term (current) drug therapy: Secondary | ICD-10-CM | POA: Diagnosis not present

## 2020-09-17 DIAGNOSIS — E1142 Type 2 diabetes mellitus with diabetic polyneuropathy: Secondary | ICD-10-CM | POA: Diagnosis not present

## 2020-09-17 LAB — CBC WITH DIFFERENTIAL/PLATELET
Abs Immature Granulocytes: 0.04 10*3/uL (ref 0.00–0.07)
Basophils Absolute: 0.1 10*3/uL (ref 0.0–0.1)
Basophils Relative: 1 %
Eosinophils Absolute: 0.5 10*3/uL (ref 0.0–0.5)
Eosinophils Relative: 5 %
HCT: 49.4 % (ref 39.0–52.0)
Hemoglobin: 16.2 g/dL (ref 13.0–17.0)
Immature Granulocytes: 0 %
Lymphocytes Relative: 26 %
Lymphs Abs: 2.4 10*3/uL (ref 0.7–4.0)
MCH: 29.8 pg (ref 26.0–34.0)
MCHC: 32.8 g/dL (ref 30.0–36.0)
MCV: 90.8 fL (ref 80.0–100.0)
Monocytes Absolute: 0.6 10*3/uL (ref 0.1–1.0)
Monocytes Relative: 7 %
Neutro Abs: 5.6 10*3/uL (ref 1.7–7.7)
Neutrophils Relative %: 61 %
Platelets: 180 10*3/uL (ref 150–400)
RBC: 5.44 MIL/uL (ref 4.22–5.81)
RDW: 13.4 % (ref 11.5–15.5)
WBC: 9.2 10*3/uL (ref 4.0–10.5)
nRBC: 0 % (ref 0.0–0.2)

## 2020-09-17 LAB — COMPREHENSIVE METABOLIC PANEL
ALT: 39 U/L (ref 0–44)
AST: 35 U/L (ref 15–41)
Albumin: 4.2 g/dL (ref 3.5–5.0)
Alkaline Phosphatase: 48 U/L (ref 38–126)
Anion gap: 14 (ref 5–15)
BUN: 13 mg/dL (ref 8–23)
CO2: 21 mmol/L — ABNORMAL LOW (ref 22–32)
Calcium: 10.1 mg/dL (ref 8.9–10.3)
Chloride: 100 mmol/L (ref 98–111)
Creatinine, Ser: 1.12 mg/dL (ref 0.61–1.24)
GFR, Estimated: >60 mL/min (ref >60)
Glucose, Bld: 283 mg/dL — ABNORMAL HIGH (ref 70–99)
Potassium: 4.3 mmol/L (ref 3.5–5.1)
Sodium: 135 mmol/L (ref 135–145)
Total Bilirubin: 1 mg/dL (ref 0.3–1.2)
Total Protein: 8 g/dL (ref 6.5–8.1)

## 2020-09-17 NOTE — Progress Notes (Signed)
[place] OFFICE PROGRESS NOTE  Patient Care Team: [place] Pink, MD as PCP - General (Family Medicine)   SUMMARY OF ONCOLOGIC HISTORY:  Oncology History Overview Note  # AUG 2014- COLO CANCER [s/p sigmoid colectomy] STAGE III [pT1a (adeno ca in large tubulovillous adenoma- invades sub-mucosa)pN1(1/4 Ln pos)]; well diff low grade; margins- neg; on CALGB 00762 [FOLFOX x6 treatments- Oct 2014-Dec 2014]- Study drug x 3 years [Oct 2018] placebo Vs.celexocib. Aug 2015- Colo [Dr.Elliot] Tubular adenoma x 2 ; CT march 2017- NED; CT OCT 2017- NED; colo- oct 2017 [repeat in 2019]  # LEFT KIDNEY cysts/ exophytic lesions <cm- STABLE; cont f/u on CT scan.   # Hx defib; DM  # OCT 2018- MMR-STABLE.   DIAGNOSIS: COLON CANCER  STAGE:   III      ;GOALS: cure  CURRENT/MOST RECENT THERAPY: surveillance    Cancer of sigmoid colon ([place])       INTERVAL HISTORY:  A very pleasant 64 -year-old male patient with above history of colon cancer stage III on protocol CALGB 26333- is here for follow-up.  Patient denies any episodes of dizziness or vertigo.  Chronic mild fatigue.  Not any worse.  No abdominal pain no nausea no vomiting.  No blood in stools or black or stools. Review of Systems  Constitutional: Positive for malaise/fatigue. Negative for chills, diaphoresis, fever and weight loss.  HENT: Negative for nosebleeds and sore throat.   Eyes: Negative for double vision.  Respiratory: Negative for cough, hemoptysis, sputum production, shortness of breath and wheezing.   Cardiovascular: Negative for chest pain, palpitations, orthopnea and leg swelling.  Gastrointestinal: Negative for abdominal pain, blood in stool, constipation, diarrhea, heartburn, melena, nausea and vomiting.  Genitourinary: Negative for dysuria, frequency and urgency.  Musculoskeletal: Negative for back pain and joint pain.  Skin: Negative.  Negative for itching and rash.  Neurological: Positive for dizziness  and tingling. Negative for focal weakness, weakness and headaches.  Endo/Heme/Allergies: Does not bruise/bleed easily.  Psychiatric/Behavioral: Negative for depression. The patient is not nervous/anxious and does not have insomnia.      PAST MEDICAL HISTORY :  Past Medical History:  Diagnosis Date  . 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  . Cardiac arrhythmia 06/22/2013  . Cardiomyopathy ([place]) 06/22/2013  . CHF (congestive heart failure) ([place])   . Colon cancer [facility]) 2014   Partial Colon Resection.  . Diabetes mellitus without complication ([place])    Patient takes Metformin  . Hyperlipidemia   . Hypertension   . Neck pain on right side 07/16/2015    PAST SURGICAL HISTORY :   Past Surgical History:  Procedure Laterality Date  . Biventricular implantable cardioverter-defbrillator Left 04/27/2014   Medtronic biventricular ICD model Consulta CRTD [place], serial [id number]. The right atrial lead is Medtronic [id number], serial # [id number].  The right ventrcular lead is Medtronic [id number], serial # [id number] and the LV lead is 4196 Medtronic serial # [id number]  . BLADDER SURGERY N/A 06/22/2013   "bladder stretched"  . CARDIAC CATHETERIZATION    . CHOLECYSTECTOMY  01/22/2014   Dr. Tamala Julian  . COLECTOMY Left 05/25/2013   sigmoid colectomy  . COLONOSCOPY WITH PROPOFOL N/A 08/03/2016   Procedure: COLONOSCOPY WITH PROPOFOL;  Surgeon: Manya Silvas, MD;  Location: [facility] ENDOSCOPY;  Service: Endoscopy;  Laterality: N/A;  . COLONOSCOPY WITH PROPOFOL N/A 09/21/2018   Procedure: COLONOSCOPY WITH PROPOFOL;  Surgeon: Manya Silvas, MD;  Location: [facility] ENDOSCOPY;  Service: Endoscopy;  Laterality: N/A;  . INSERTION CENTRAL VENOUS ACCESS DEVICE W/ SUBCUTANEOUS PORT  2014   Dr. Tamala Julian  . ROTATOR CUFF REPAIR Left 08/2003    FAMILY HISTORY :  Mom- breast/colon; mat- aunt- breast. No bladder /stomach cancer.  Family History   Problem Relation Age of Onset  . Cancer Mother   . Diabetes Father   . Stroke Father   . Sleep apnea Sister   . Cancer Maternal Aunt     SOCIAL HISTORY:   Social History   Tobacco Use  . Smoking status: Never Smoker  . Smokeless tobacco: Never Used  Substance Use Topics  . Alcohol use: No  . Drug use: No    ALLERGIES:  is allergic to no known allergies.  MEDICATIONS:  Current Outpatient Medications  Medication Sig Dispense Refill  . carvedilol (COREG) 3.125 MG tablet Take 3.125 mg by mouth 2 (two) times daily with a meal.     . furosemide (LASIX) 20 MG tablet Take 10 mg by mouth daily as needed.    Marland Kitchen glimepiride (AMARYL) 1 MG tablet Take 2 mg by mouth 2 (two) times daily. Take 2 tablets twice daily    . glucose blood test strip Use 2 (two) times daily. Use as instructed.    . metFORMIN (GLUCOPHAGE) 500 MG tablet Take 1,000 mg by mouth 2 (two) times daily with a meal.    . olmesartan-hydrochlorothiazide (BENICAR HCT) 20-12.5 MG per tablet Take 0.5 tablets by mouth daily.    . simvastatin (ZOCOR) 40 MG tablet Take 40 mg by mouth daily. Once daily at bedtime    . sitaGLIPtin (JANUVIA) 100 MG tablet Take 100 mg by mouth 1 day or 1 dose. In the morning     No current facility-administered medications for this visit.    PHYSICAL EXAMINATION: ECOG PERFORMANCE STATUS: 0 - Asymptomatic  BP 95/69 (BP Location: Left Arm, Patient Position: Sitting, Cuff Size: Large)   Pulse 82   Temp 97.6 F (36.4 C) (Tympanic)   Resp 16   Ht 5' 9"  (1.753 m)   Wt 244 lb (110.7 kg)   SpO2 97%   BMI 36.03 kg/m   Filed Weights   09/17/20 1010  Weight: 244 lb (110.7 kg)    Physical Exam HENT:     Head: Normocephalic and atraumatic.     Mouth/Throat:     Pharynx: No oropharyngeal exudate.  Eyes:     Pupils: Pupils are equal, round, and reactive to light.  Cardiovascular:     Rate and Rhythm: Normal rate and regular rhythm.  Pulmonary:     Effort: No respiratory distress.     Breath  sounds: No wheezing.  Abdominal:     General: Bowel sounds are normal. There is no distension.     Palpations: Abdomen is soft. There is no mass.     Tenderness: There is no abdominal tenderness. There is no guarding or rebound.  Musculoskeletal:        General: No tenderness. Normal range of motion.     Cervical back: Normal range of motion and neck supple.  Skin:    General: Skin is warm.  Neurological:     Mental Status: He is alert and oriented to person, place, and time.  Psychiatric:        Mood and Affect: Affect normal.     LABORATORY DATA:  I have reviewed the data as listed    Component Value Date/Time   NA 135 09/17/2020 0936   NA 132 (  L) 01/07/2015 1052   K 4.3 09/17/2020 0936   K 4.3 01/07/2015 1052   CL 100 09/17/2020 0936   CL 101 01/07/2015 1052   CO2 21 (L) 09/17/2020 0936   CO2 23 01/07/2015 1052   GLUCOSE 283 (H) 09/17/2020 0936   GLUCOSE 404 (H) 01/07/2015 1052   BUN 13 09/17/2020 0936   BUN 14 01/07/2015 1052   CREATININE 1.12 09/17/2020 0936   CREATININE 0.99 01/07/2015 1052   CALCIUM 10.1 09/17/2020 0936   CALCIUM 9.2 01/07/2015 1052   PROT 8.0 09/17/2020 0936   PROT 7.7 01/07/2015 1052   ALBUMIN 4.2 09/17/2020 0936   ALBUMIN 4.3 01/07/2015 1052   AST 35 09/17/2020 0936   AST 42 (H) 01/07/2015 1052   ALT 39 09/17/2020 0936   ALT 51 01/07/2015 1052   ALKPHOS 48 09/17/2020 0936   ALKPHOS 64 01/07/2015 1052   BILITOT 1.0 09/17/2020 0936   BILITOT 1.0 01/07/2015 1052   GFRNONAA >60 09/17/2020 0936   GFRNONAA >60 01/07/2015 1052   GFRAA >60 03/19/2020 0931   GFRAA >60 01/07/2015 1052    No results found for: SPEP, UPEP  Lab Results  Component Value Date   WBC 9.2 09/17/2020   NEUTROABS 5.6 09/17/2020   HGB 16.2 09/17/2020   HCT 49.4 09/17/2020   MCV 90.8 09/17/2020   PLT 180 09/17/2020      Chemistry      Component Value Date/Time   NA 135 09/17/2020 0936   NA 132 (L) 01/07/2015 1052   K 4.3 09/17/2020 0936   K 4.3 01/07/2015  1052   CL 100 09/17/2020 0936   CL 101 01/07/2015 1052   CO2 21 (L) 09/17/2020 0936   CO2 23 01/07/2015 1052   BUN 13 09/17/2020 0936   BUN 14 01/07/2015 1052   CREATININE 1.12 09/17/2020 0936   CREATININE 0.99 01/07/2015 1052      Component Value Date/Time   CALCIUM 10.1 09/17/2020 0936   CALCIUM 9.2 01/07/2015 1052   ALKPHOS 48 09/17/2020 0936   ALKPHOS 64 01/07/2015 1052   AST 35 09/17/2020 0936   AST 42 (H) 01/07/2015 1052   ALT 39 09/17/2020 0936   ALT 51 01/07/2015 1052   BILITOT 1.0 09/17/2020 0936   BILITOT 1.0 01/07/2015 1052         ASSESSMENT & PLAN:   Cancer of sigmoid colon [facility]) # Colon Cancer- stage III- on CALGB 25053; intolerance to celecoxib/placebo discontinued in Oct 2016 secondary to elevated blood sugars.  STABLE. .  # CT in Nov 2020- showed no evidence of disease. December 2019 colonoscopy negative. Clinically stable without evidence of recurrence. CEA 1.2 today.  Continue monitoring on a clinical basis; every 12 months  # Diabetes Mellitus- Fasting--283.   Continue follow-up with endocrinology.  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 STABLE    Disposition:  # Follow up in 12 months-MD; labs cbc, cmp, cea-Dr. B  Dr.Fath/Dr.Hedrick  # 25 minutes face-to-face with the patient discussing the above plan of care; more than 50% of time spent on prognosis/ natural history; counseling and coordination.    Cammie Sickle, MD 09/18/2020 7:47 AM

## 2020-09-17 NOTE — Assessment & Plan Note (Addendum)
#   Colon Cancer- stage III- on CALGB 00164; intolerance to celecoxib/placebo discontinued in Oct 2016 secondary to elevated blood sugars.  STABLE. .  # CT in Nov 2020- showed no evidence of disease. December 2019 colonoscopy negative. Clinically stable without evidence of recurrence. CEA 1.2 today.  Continue monitoring on a clinical basis; every 12 months  # Diabetes Mellitus- Fasting--283.   Continue follow-up with endocrinology.  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 STABLE    Disposition:  # Follow up in 12 months-MD; labs cbc, cmp, cea-Dr. B  Dr.Fath/Dr.Hedrick

## 2020-09-18 LAB — CEA: CEA: 1 ng/mL (ref 0.0–4.7)

## 2020-11-07 ENCOUNTER — Other Ambulatory Visit: Payer: Self-pay | Admitting: Cardiology

## 2020-11-07 DIAGNOSIS — Z45018 Encounter for adjustment and management of other part of cardiac pacemaker: Secondary | ICD-10-CM | POA: Insufficient documentation

## 2020-11-07 DIAGNOSIS — I429 Cardiomyopathy, unspecified: Secondary | ICD-10-CM | POA: Insufficient documentation

## 2020-11-07 DIAGNOSIS — Z01818 Encounter for other preprocedural examination: Secondary | ICD-10-CM

## 2020-11-20 ENCOUNTER — Other Ambulatory Visit: Payer: Self-pay

## 2020-11-20 ENCOUNTER — Other Ambulatory Visit
Admission: RE | Admit: 2020-11-20 | Discharge: 2020-11-20 | Disposition: A | Discharge status: Home / Self Care | Payer: Commercial Managed Care - PPO | Source: Ambulatory Visit | Attending: Cardiology | Admitting: Cardiology

## 2020-11-20 DIAGNOSIS — Z20822 Contact with and (suspected) exposure to covid-19: Secondary | ICD-10-CM | POA: Diagnosis not present

## 2020-11-20 DIAGNOSIS — Z01812 Encounter for preprocedural laboratory examination: Secondary | ICD-10-CM | POA: Diagnosis not present

## 2020-11-20 LAB — SARS CORONAVIRUS 2 (TAT 6-24 HRS): SARS Coronavirus 2: NEGATIVE

## 2020-11-22 ENCOUNTER — Other Ambulatory Visit: Payer: Self-pay

## 2020-11-22 ENCOUNTER — Encounter
Admission: RE | Disposition: A | Discharge status: Home / Self Care | Payer: Self-pay | Source: Home / Self Care | Attending: Cardiology

## 2020-11-22 ENCOUNTER — Ambulatory Visit
Admission: RE | Admit: 2020-11-22 | Discharge: 2020-11-22 | Disposition: A | Discharge status: Home / Self Care | Payer: Commercial Managed Care - PPO | Attending: Cardiology | Admitting: Cardiology

## 2020-11-22 ENCOUNTER — Encounter: Payer: Self-pay | Admitting: Cardiology

## 2020-11-22 DIAGNOSIS — E119 Type 2 diabetes mellitus without complications: Secondary | ICD-10-CM | POA: Diagnosis not present

## 2020-11-22 DIAGNOSIS — E785 Hyperlipidemia, unspecified: Secondary | ICD-10-CM | POA: Diagnosis not present

## 2020-11-22 DIAGNOSIS — I429 Cardiomyopathy, unspecified: Secondary | ICD-10-CM | POA: Insufficient documentation

## 2020-11-22 DIAGNOSIS — I420 Dilated cardiomyopathy: Secondary | ICD-10-CM | POA: Diagnosis not present

## 2020-11-22 DIAGNOSIS — Z45018 Encounter for adjustment and management of other part of cardiac pacemaker: Secondary | ICD-10-CM

## 2020-11-22 DIAGNOSIS — I10 Essential (primary) hypertension: Secondary | ICD-10-CM | POA: Insufficient documentation

## 2020-11-22 DIAGNOSIS — Z4502 Encounter for adjustment and management of automatic implantable cardiac defibrillator: Secondary | ICD-10-CM | POA: Diagnosis not present

## 2020-11-22 HISTORY — PX: ICD GENERATOR CHANGEOUT: EP1231

## 2020-11-22 LAB — GLUCOSE, CAPILLARY: Glucose-Capillary: 173 mg/dL — ABNORMAL HIGH (ref 70–99)

## 2020-11-22 SURGERY — ICD GENERATOR CHANGEOUT
Anesthesia: Moderate Sedation

## 2020-11-22 MED ORDER — ONDANSETRON HCL 4 MG/2ML IJ SOLN: 4.0000 mg | Freq: Four times a day (QID) | INTRAMUSCULAR | Status: DC | PRN

## 2020-11-22 MED ORDER — ACETAMINOPHEN 325 MG PO TABS: 325.0000 mg | ORAL_TABLET | ORAL | Status: DC | PRN

## 2020-11-22 MED ORDER — HEPARIN (PORCINE) IN NACL 1000-0.9 UT/500ML-% IV SOLN
INTRAVENOUS | Status: DC | PRN
  Administered 2020-11-22: 500 mL

## 2020-11-22 MED ORDER — MIDAZOLAM HCL 2 MG/2ML IJ SOLN
INTRAMUSCULAR | Status: AC
  Filled 2020-11-22: qty 2

## 2020-11-22 MED ORDER — SODIUM CHLORIDE 0.9 % IV SOLN
80.0000 mg | INTRAVENOUS | Status: AC
  Administered 2020-11-22: 80 mg
  Filled 2020-11-22: qty 80

## 2020-11-22 MED ORDER — CEFAZOLIN SODIUM-DEXTROSE 2-4 GM/100ML-% IV SOLN
2.0000 g | INTRAVENOUS | Status: AC
  Administered 2020-11-22: 2 g via INTRAVENOUS

## 2020-11-22 MED ORDER — HEPARIN (PORCINE) IN NACL 1000-0.9 UT/500ML-% IV SOLN
INTRAVENOUS | Status: AC
  Filled 2020-11-22: qty 1000

## 2020-11-22 MED ORDER — FENTANYL CITRATE (PF) 100 MCG/2ML IJ SOLN
INTRAMUSCULAR | Status: AC
  Filled 2020-11-22: qty 2

## 2020-11-22 MED ORDER — SODIUM CHLORIDE 0.9 % IV SOLN: INTRAVENOUS | Status: DC

## 2020-11-22 MED ORDER — CHLORHEXIDINE GLUCONATE 4 % EX LIQD: 4.0000 "application " | Freq: Once | CUTANEOUS | Status: DC

## 2020-11-22 MED ORDER — FENTANYL CITRATE (PF) 100 MCG/2ML IJ SOLN
INTRAMUSCULAR | Status: DC | PRN
  Administered 2020-11-22 (×3): 25 ug via INTRAVENOUS

## 2020-11-22 MED ORDER — MIDAZOLAM HCL 2 MG/2ML IJ SOLN
INTRAMUSCULAR | Status: DC | PRN
  Administered 2020-11-22 (×3): 1 mg via INTRAVENOUS

## 2020-11-22 MED ORDER — LIDOCAINE HCL (PF) 1 % IJ SOLN
INTRAMUSCULAR | Status: AC
  Filled 2020-11-22: qty 30

## 2020-11-22 MED ORDER — LIDOCAINE HCL (PF) 1 % IJ SOLN
INTRAMUSCULAR | Status: DC | PRN
  Administered 2020-11-22: 30 mL

## 2020-11-22 SURGICAL SUPPLY — 12 items
CABLE SURG 12 DISP A/V CHANNEL (MISCELLANEOUS) IMPLANT
DEVICE DSSCT PLSMBLD 3.0S LGHT (MISCELLANEOUS) ×1 IMPLANT
ICD CLARIA MRI DTMA1D1 (ICD Generator) ×2 IMPLANT
PAD ELECT DEFIB RADIOL ZOLL (MISCELLANEOUS) ×2 IMPLANT
PLASMABLADE 3.0S W/LIGHT (MISCELLANEOUS) ×2
SUT DVC V-LOC 4-0 90 CLR P-12 (SUTURE) ×2
SUT DVC VLOC 3-0 CL 6 P-12 (SUTURE) ×2 IMPLANT
SUT SILK 0 SH 30 (SUTURE) ×2 IMPLANT
SUT VIC AB 2-0 CT1 27 (SUTURE) ×2
SUT VIC AB 2-0 CT1 TAPERPNT 27 (SUTURE) ×1 IMPLANT
SUTURE DVC V-LC4-0 90 CLR P-12 (SUTURE) ×1 IMPLANT
TRAY PACEMAKER INSERTION (PACKS) ×2 IMPLANT

## 2020-11-22 NOTE — Cardiovascular Report (Addendum)
Patients NYHA class is II this is in reference to the procedure performed on 11/22/20.

## 2020-11-22 NOTE — H&P (Signed)
PCP: Lovie Macadamia, MD  History of Present Illness: [name] [name] is a 65 y.o.male patient who has a past medical history significant for dilated cardiomyopathy, ICD in place, uncontrolled type 2 diabetes, hypertension, and hyperlipidemia who presents for a follow up visit. He noted his BiV AICD began beeping every morning since January 7 of this year. Interrogation of device today revealed that the device reached ERI on 10/11/2020. Device is a Therapist, music. Lower rate is set at 50 upper rate of 150. There were no evidence of treatments. He reports that he is doing well from a cardiac standpoint and denies chest pain, palpitations, or heart racing sensation. He has had no terminated episodes with his ICD. He also denies shortness of breath, lower extremity swelling, orthopnea, PND, or syncopal/presyncopal episodes.  Here today for biventricular ICD generator change out/replacement.   Physical exam lungs clear to auscultation cardiovascular regular rate and rhythm device is located in the left infraclavicular fossa site is well-healed  Mallampati 2  Plan for biventricular ICD generator replacement.

## 2020-11-22 NOTE — Discharge Instructions (Signed)
Cardioverter Defibrillator Implantation, Care After The following information offers guidance on how to care for yourself after your procedure. Your health care provider may also give you more specific instructions. If you have problems or questions, contact your health care provider. What can I expect after the procedure? After the procedure, it is common to have:  Some pain. Pain may last a few days.  A slight bump over the skin where the device was placed. Sometimes, it is possible to feel the device under the skin. This is normal. During the months and years after your procedure, your health care provider will check the device, the wires (leads), and the battery every few months. The generator is monitored over time and will be replaced when the battery is depleted. Follow these instructions at home: Medicines  Take over-the-counter and prescription medicines only as told by your health care provider.  If you were prescribed an antibiotic medicine, take it as told by your health care provider. Do not stop taking the antibiotic even if you start to feel better. Bathing  Do not take baths, swim, or use a hot tub until your health care provider approves.  Ask your health care provider when you may take showers. You may only be allowed to take sponge baths. Cover your incision with a watertight covering if you take a sponge bath. Incision care  Follow instructions from your health care provider about how to take care of your incision. Make sure you: ? Wash your hands with soap and water for at least 20 seconds before and after you change your bandage (dressing). If soap and water are not available, use hand sanitizer. ? Change your dressing as told by your health care provider. ? Leave stitches (sutures), skin glue, adhesive strips, or staples in place. These skin closures may need to stay in place for 2 weeks or longer. If adhesive strip edges start to loosen and curl up, you may trim the  loose edges. Do not remove adhesive strips completely unless your health care provider tells you to do that.  Check your incision area every day for signs of infection. Check for: ? Redness, swelling, or more pain. ? Fluid or blood. ? Warmth. ? Pus or a bad smell.  Do not use lotions or ointments near the incision area unless told by your health care provider.  Do not wear tight clothes or clothes that could rub on your incision.   Activity  Do not lift anything that is heavier than 10 lb (4.5 kg), or the limit that you are told, until your health care provider says that it is safe.  Return to your normal activities as told by your health care provider. Ask your health care provider what activities are safe for you. Follow instructions from your health care provider about sports, exercise, work, and sexual activity restrictions after your procedure.  Avoid sitting for a long time without moving. Get up to take short walks every 1-2 hours. This is important to improve blood flow and breathing. Ask for help if you feel weak or unsteady.  For at least 6 weeks: ? Do not lift your upper arm above your shoulders. You may be given a sling to use. This means no tennis, golf, or swimming for this period of time. ? If you tend to sleep with your arm above your head, use a restraint, such as a sling, to prevent movement during sleep. ? You should use your shoulder on the side of the defibrillator  in daily tasks that do not require a lot of motion. ? Avoid sudden jerking, pulling, or chopping movements that pull your upper arm far away from your body.  Do not drive or operate machinery until your health care provider says that it is safe.  Participate in a cardiac rehabilitation program as told by your health care provider. A cardiac rehabilitation program is a treatment program to improve your health and well-being through exercise training, education, and counseling.   Electric and magnetic  fields  Tell all health care providers, including your dentist, that you have a defibrillator. Some medical devices and imaging methods such as MRI may affect your device.  If you must pass through a metal detector, quickly walk through it. Do not stop under the detector. Do not stand near it.  Avoid places or objects that have a strong electric or magnetic field, including: ? Airport [employer]. At the airport, let officials know that you have a defibrillator. Your defibrillator ID card will let you be checked in a way that is safe for you and will not damage your defibrillator. Do not let a security person wave a magnetic wand near your defibrillator. That can make it stop working. ? Power plants. ? Large electrical generators. ? Anti-theft systems or electronic article surveillance (EAS). ? Radiofrequency transmission towers, such as mobile phone and radio towers.  Some household devices and appliances may produce electromagnetic waves that affect your defibrillator. If you are not sure if something is safe to use, ask your health care provider. ? When using your mobile phone, hold it to the ear that is on the opposite side from the defibrillator. Do not leave your mobile phone in a pocket over the defibrillator. ? Many devices contain magnets. Examples include headphones and electronic cigarettes. Do not put them in a pocket near your device. If they are wearable, wear them as far away from the device as possible. ? Some devices are safe to use if they are held at least 12 inches (30 cm) away from your defibrillator. These include power tools, chain saws, lawn mowers, and speakers. ? You may safely use electric blankets, heating pads, computers, and microwave ovens. ? Do not use amateur or ham radio equipment, electric (arc) welding torches, magnetic mattresses or chairs, and body fat measuring scales. General instructions  Always keep your defibrillator ID card with you. The card should  list the implant date, device model, and manufacturer. Consider wearing a medical alert bracelet or necklace.  Have your defibrillator checked as often as told by your health care provider. Most defibrillators last for 5-7 years.  Do not use any products that contain nicotine or tobacco. These products include cigarettes, chewing tobacco, and vaping devices, such as e-cigarettes. If you need help quitting, ask your health care provider.  Discuss specific device restrictions with your device manufacturer or health care provider.  Get screened for depression and anxiety, and seek treatment if needed.  Keep all follow-up visits. This is important. Contact a health care provider if:  You feel one shock in your chest.  You gain weight suddenly.  You have a fever.  You have swelling in your arm on the same side of your body as the device or have swelling in your legs or feet.  It feels like your heart is fluttering or skipping beats (heart palpitations).  You have any of these signs of infection around your incision: ? Redness, swelling, or more pain. ? Fluid or blood. ?  Warmth. ? Pus or a bad smell.  You are feeling depressed, scared, anxious, or sad. Get help right away if:  You have chest pain.  You feel more than one shock.  You have problems breathing or have shortness of breath.  You have dizziness or fainting.  Your incision starts to open up. These symptoms may represent a serious problem that is an emergency. Do not wait to see if the symptoms will go away. Get medical help right away. Call your local emergency services (911 in the [place] Do not drive yourself to the hospital. Summary  It is important to follow your activity restrictions for at least 6 weeks following your device implantation. Do not lift your upper arm above your shoulder.  Always keep your defibrillator ID card with you. The card should list the implant date, device model, and manufacturer. Consider  wearing a medical alert bracelet or necklace.  Follow the device distance restrictions for electric and magnetic fields.  Keep all follow-up visits with your health care provider for monitoring of your defibrillator.  Contact your health care provider if you have signs of an infection such as a fever or redness, swelling, or more pain around your incision. Get help right away if you have chest pain, receive more than one shock, or faint. This information is not intended to replace advice given to you by your health care provider. Make sure you discuss any questions you have with your health care provider. Document Revised: 03/20/2020 Document Reviewed: 03/20/2020 Elsevier Patient Education  [address].

## 2020-12-23 NOTE — Hospital Course (Signed)
PTs NYHA class is  Class 3.

## 2021-09-17 ENCOUNTER — Ambulatory Visit: Payer: Commercial Managed Care - PPO | Admitting: Internal Medicine

## 2021-09-17 ENCOUNTER — Other Ambulatory Visit: Payer: Commercial Managed Care - PPO

## 2021-09-19 ENCOUNTER — Other Ambulatory Visit: Payer: Commercial Managed Care - PPO

## 2021-09-19 ENCOUNTER — Ambulatory Visit: Payer: Commercial Managed Care - PPO | Admitting: Oncology

## 2021-10-08 ENCOUNTER — Other Ambulatory Visit: Payer: Self-pay | Admitting: *Deleted

## 2021-10-08 DIAGNOSIS — C187 Malignant neoplasm of sigmoid colon: Secondary | ICD-10-CM

## 2021-10-14 ENCOUNTER — Inpatient Hospital Stay (HOSPITAL_BASED_OUTPATIENT_CLINIC_OR_DEPARTMENT_OTHER): Payer: Commercial Managed Care - PPO | Admitting: Internal Medicine

## 2021-10-14 ENCOUNTER — Other Ambulatory Visit: Payer: Self-pay

## 2021-10-14 ENCOUNTER — Encounter: Payer: Self-pay | Admitting: Internal Medicine

## 2021-10-14 ENCOUNTER — Inpatient Hospital Stay: Payer: Commercial Managed Care - PPO | Attending: Internal Medicine

## 2021-10-14 DIAGNOSIS — Z836 Family history of other diseases of the respiratory system: Secondary | ICD-10-CM | POA: Diagnosis not present

## 2021-10-14 DIAGNOSIS — C187 Malignant neoplasm of sigmoid colon: Secondary | ICD-10-CM

## 2021-10-14 DIAGNOSIS — E1142 Type 2 diabetes mellitus with diabetic polyneuropathy: Secondary | ICD-10-CM | POA: Insufficient documentation

## 2021-10-14 DIAGNOSIS — Z833 Family history of diabetes mellitus: Secondary | ICD-10-CM | POA: Insufficient documentation

## 2021-10-14 DIAGNOSIS — Z85038 Personal history of other malignant neoplasm of large intestine: Secondary | ICD-10-CM | POA: Insufficient documentation

## 2021-10-14 DIAGNOSIS — Z9049 Acquired absence of other specified parts of digestive tract: Secondary | ICD-10-CM | POA: Diagnosis not present

## 2021-10-14 DIAGNOSIS — Z809 Family history of malignant neoplasm, unspecified: Secondary | ICD-10-CM | POA: Diagnosis not present

## 2021-10-14 DIAGNOSIS — Z823 Family history of stroke: Secondary | ICD-10-CM | POA: Diagnosis not present

## 2021-10-14 DIAGNOSIS — M25511 Pain in right shoulder: Secondary | ICD-10-CM | POA: Diagnosis not present

## 2021-10-14 DIAGNOSIS — Z79899 Other long term (current) drug therapy: Secondary | ICD-10-CM | POA: Diagnosis not present

## 2021-10-14 LAB — CBC WITH DIFFERENTIAL/PLATELET
Abs Immature Granulocytes: 0.04 10*3/uL (ref 0.00–0.07)
Basophils Absolute: 0.1 10*3/uL (ref 0.0–0.1)
Basophils Relative: 1 %
Eosinophils Absolute: 0.6 10*3/uL — ABNORMAL HIGH (ref 0.0–0.5)
Eosinophils Relative: 6 %
HCT: 48 % (ref 39.0–52.0)
Hemoglobin: 15.6 g/dL (ref 13.0–17.0)
Immature Granulocytes: 0 %
Lymphocytes Relative: 27 %
Lymphs Abs: 2.6 10*3/uL (ref 0.7–4.0)
MCH: 29.9 pg (ref 26.0–34.0)
MCHC: 32.5 g/dL (ref 30.0–36.0)
MCV: 92.1 fL (ref 80.0–100.0)
Monocytes Absolute: 0.7 10*3/uL (ref 0.1–1.0)
Monocytes Relative: 8 %
Neutro Abs: 5.8 10*3/uL (ref 1.7–7.7)
Neutrophils Relative %: 58 %
Platelets: 215 10*3/uL (ref 150–400)
RBC: 5.21 MIL/uL (ref 4.22–5.81)
RDW: 13.2 % (ref 11.5–15.5)
WBC: 9.8 10*3/uL (ref 4.0–10.5)
nRBC: 0 % (ref 0.0–0.2)

## 2021-10-14 LAB — COMPREHENSIVE METABOLIC PANEL WITH GFR
ALT: 33 U/L (ref 0–44)
AST: 34 U/L (ref 15–41)
Albumin: 4.2 g/dL (ref 3.5–5.0)
Alkaline Phosphatase: 45 U/L (ref 38–126)
Anion gap: 6 (ref 5–15)
BUN: 11 mg/dL (ref 8–23)
CO2: 21 mmol/L — ABNORMAL LOW (ref 22–32)
Calcium: 8.8 mg/dL — ABNORMAL LOW (ref 8.9–10.3)
Chloride: 107 mmol/L (ref 98–111)
Creatinine, Ser: 0.97 mg/dL (ref 0.61–1.24)
GFR, Estimated: >60 mL/min (ref >60)
Glucose, Bld: 170 mg/dL — ABNORMAL HIGH (ref 70–99)
Potassium: 4.1 mmol/L (ref 3.5–5.1)
Sodium: 134 mmol/L — ABNORMAL LOW (ref 135–145)
Total Bilirubin: 0.7 mg/dL (ref 0.3–1.2)
Total Protein: 7.7 g/dL (ref 6.5–8.1)

## 2021-10-14 NOTE — Patient Instructions (Signed)
#  Will refer to GI regarding follow-up colonoscopy.

## 2021-10-14 NOTE — Progress Notes (Signed)
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 Aug 2015- Colo [Dr.Elliot] Tubular adenoma x 2 ; CT march 2017- NED; CT OCT 2017- NED; colo- oct 2017 [repeat in 2019]   # LEFT KIDNEY cysts/ exophytic lesions <cm- STABLE; cont f/u on CT scan.    # Hx defib; DM  # OCT 2018- MMR-STABLE.   DIAGNOSIS: COLON CANCER  STAGE:   III      ;GOALS: cure  CURRENT/MOST RECENT THERAPY: surveillance    Cancer of sigmoid colon ([place])    INTERVAL HISTORY:  A very pleasant 66 -year-old male patient with above history of colon cancer stage III on protocol CALGB 33545- is here for follow-up.  Complains of mild right-sided shoulder pain.  Not any worse.  States that he has gained weight during the holidays. No abdominal pain no nausea no vomiting.  No blood in stools or black or stools.  Review of Systems  Constitutional:  Positive for malaise/fatigue. Negative for chills, diaphoresis, fever and weight loss.  HENT:  Negative for nosebleeds and sore throat.   Eyes:  Negative for double vision.  Respiratory:  Negative for cough, hemoptysis, sputum production, shortness of breath and wheezing.   Cardiovascular:  Negative for chest pain, palpitations, orthopnea and leg swelling.  Gastrointestinal:  Negative for abdominal pain, blood in stool, constipation, diarrhea, heartburn, melena, nausea and vomiting.  Genitourinary:  Negative for dysuria, frequency and urgency.  Musculoskeletal:  Negative for back pain and joint pain.  Skin: Negative.  Negative for itching and rash.   Neurological:  Positive for dizziness and tingling. Negative for focal weakness, weakness and headaches.  Endo/Heme/Allergies:  Does not bruise/bleed easily.  Psychiatric/Behavioral:  Negative for depression. The patient is not nervous/anxious and does not have insomnia.     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   Cardiac arrhythmia 06/22/2013   Cardiomyopathy ([place]) 06/22/2013   CHF (congestive heart failure) ([place])    Colon cancer ([place]) 2014   Partial Colon Resection.   Diabetes mellitus without complication [facility])    Patient takes Metformin   Hyperlipidemia    Hypertension    Neck pain on right side 07/16/2015    PAST SURGICAL HISTORY :   Past Surgical History:  Procedure Laterality Date   Biventricular implantable cardioverter-defbrillator Left 04/27/2014   Medtronic biventricular ICD model Consulta CRTD [place], serial #[id number]. The right atrial lead is Medtronic [id number], serial # [id number].  The right ventrcular lead is Medtronic [id number], serial # [id number] and the LV lead is 4196 Medtronic serial # [id number]   BLADDER SURGERY N/A 06/22/2013   "bladder stretched"   CARDIAC CATHETERIZATION     CHOLECYSTECTOMY  01/22/2014   Dr. Tamala Julian   COLECTOMY Left 05/25/2013   sigmoid colectomy   COLONOSCOPY WITH PROPOFOL N/A 08/03/2016   Procedure: COLONOSCOPY WITH PROPOFOL;  Surgeon: Manya Silvas, MD;  Location[facility] ENDOSCOPY;  Service: Endoscopy;  Laterality: N/A;   COLONOSCOPY WITH PROPOFOL N/A 09/21/2018   Procedure: COLONOSCOPY WITH PROPOFOL;  Surgeon: Manya Silvas, MD;  Location: [facility] ENDOSCOPY;  Service: Endoscopy;  Laterality: N/A;   ICD GENERATOR CHANGEOUT N/A 11/22/2020   Procedure: ICD GENERATOR CHANGEOUT;  Surgeon: Marzetta Board, MD;  Location: [facility] CV LAB;  Service: Cardiovascular;  Laterality: N/A;   INSERTION CENTRAL  VENOUS ACCESS DEVICE W/ SUBCUTANEOUS PORT  2014   Dr. Tamala Julian   ROTATOR CUFF REPAIR Left 08/2003    FAMILY HISTORY :  Mom- breast/colon; mat- aunt- breast. No bladder /stomach cancer.  Family History  Problem Relation Age of Onset   Cancer Mother    Diabetes Father    Stroke Father    Sleep apnea Sister    Cancer Maternal Aunt     SOCIAL HISTORY:   Social History   Tobacco Use   Smoking status: Never   Smokeless tobacco: Never  Vaping Use   Vaping Use: Never used  Substance Use Topics   Alcohol use: No   Drug use: No    ALLERGIES:  has No Known Allergies.  MEDICATIONS:  Current Outpatient Medications  Medication Sig Dispense Refill   carvedilol (COREG) 3.125 MG tablet Take 3.125 mg by mouth 2 (two) times daily with a meal.      FARXIGA 10 MG TABS tablet Take 10 mg by mouth every morning.     furosemide (LASIX) 20 MG tablet Take 10 mg by mouth daily as needed for edema.     glimepiride (AMARYL) 1 MG tablet Take 2 mg by mouth 2 (two) times daily. Take 2 tablets twice daily     glucose blood test strip Use 2 (two) times daily. Use as instructed.     metFORMIN (GLUCOPHAGE) 500 MG tablet Take 1,000 mg by mouth 2 (two) times daily with a meal.     NOVOLOG MIX 70/30 FLEXPEN (70-30) 100 UNIT/ML FlexPen Inject 45-50 Units into the skin See admin instructions. 45 units in the morning, 50 units at bedtime     olmesartan-hydrochlorothiazide (BENICAR HCT) 20-12.5 MG per tablet Take 0.5 tablets by mouth daily.     simvastatin (ZOCOR) 40 MG tablet Take 40 mg by mouth daily. Once daily at bedtime     sitaGLIPtin (JANUVIA) 100 MG tablet Take 100 mg by mouth daily. In the morning     No current facility-administered medications for this visit.    PHYSICAL EXAMINATION: ECOG PERFORMANCE STATUS: 0 - Asymptomatic  BP 95/72 (BP Location: Right Arm, Patient Position: Sitting, Cuff Size: Large)    Pulse 78    Temp (!) 96.7 F (35.9 C) (Tympanic)    Wt 252 lb (114.3 kg)    SpO2 97%    BMI  37.21 kg/m   Filed Weights   10/14/21 0931  Weight: 252 lb (114.3 kg)    Physical Exam HENT:     Head: Normocephalic and atraumatic.     Mouth/Throat:     Pharynx: No oropharyngeal exudate.  Eyes:     Pupils: Pupils are equal, round, and reactive to light.  Cardiovascular:     Rate and Rhythm: Normal rate and regular rhythm.  Pulmonary:     Effort: No respiratory distress.     Breath sounds: No wheezing.  Abdominal:     General: Bowel sounds are normal. There is no distension.     Palpations: Abdomen is soft. There is no mass.     Tenderness: There is no abdominal tenderness. There is no guarding or rebound.  Musculoskeletal:        General: No tenderness.  Normal range of motion.     Cervical back: Normal range of motion and neck supple.  Skin:    General: Skin is warm.  Neurological:     Mental Status: He is alert and oriented to person, place, and time.  Psychiatric:        Mood and Affect: Affect normal.    LABORATORY DATA:  I have reviewed the data as listed    Component Value Date/Time   NA 134 (L) 10/14/2021 0846   NA 132 (L) 01/07/2015 1052   K 4.1 10/14/2021 0846   K 4.3 01/07/2015 1052   CL 107 10/14/2021 0846   CL 101 01/07/2015 1052   CO2 21 (L) 10/14/2021 0846   CO2 23 01/07/2015 1052   GLUCOSE 170 (H) 10/14/2021 0846   GLUCOSE 404 (H) 01/07/2015 1052   BUN 11 10/14/2021 0846   BUN 14 01/07/2015 1052   CREATININE 0.97 10/14/2021 0846   CREATININE 0.99 01/07/2015 1052   CALCIUM 8.8 (L) 10/14/2021 0846   CALCIUM 9.2 01/07/2015 1052   PROT 7.7 10/14/2021 0846   PROT 7.7 01/07/2015 1052   ALBUMIN 4.2 10/14/2021 0846   ALBUMIN 4.3 01/07/2015 1052   AST 34 10/14/2021 0846   AST 42 (H) 01/07/2015 1052   ALT 33 10/14/2021 0846   ALT 51 01/07/2015 1052   ALKPHOS 45 10/14/2021 0846   ALKPHOS 64 01/07/2015 1052   BILITOT 0.7 10/14/2021 0846   BILITOT 1.0 01/07/2015 1052   GFRNONAA >60 10/14/2021 0846   GFRNONAA >60 01/07/2015 1052   GFRAA >60  03/19/2020 0931   GFRAA >60 01/07/2015 1052    No results found for: SPEP, UPEP  Lab Results  Component Value Date   WBC 9.8 10/14/2021   NEUTROABS 5.8 10/14/2021   HGB 15.6 10/14/2021   HCT 48.0 10/14/2021   MCV 92.1 10/14/2021   PLT 215 10/14/2021      Chemistry      Component Value Date/Time   NA 134 (L) 10/14/2021 0846   NA 132 (L) 01/07/2015 1052   K 4.1 10/14/2021 0846   K 4.3 01/07/2015 1052   CL 107 10/14/2021 0846   CL 101 01/07/2015 1052   CO2 21 (L) 10/14/2021 0846   CO2 23 01/07/2015 1052   BUN 11 10/14/2021 0846   BUN 14 01/07/2015 1052   CREATININE 0.97 10/14/2021 0846   CREATININE 0.99 01/07/2015 1052      Component Value Date/Time   CALCIUM 8.8 (L) 10/14/2021 0846   CALCIUM 9.2 01/07/2015 1052   ALKPHOS 45 10/14/2021 0846   ALKPHOS 64 01/07/2015 1052   AST 34 10/14/2021 0846   AST 42 (H) 01/07/2015 1052   ALT 33 10/14/2021 0846   ALT 51 01/07/2015 1052   BILITOT 0.7 10/14/2021 0846   BILITOT 1.0 01/07/2015 1052         ASSESSMENT & PLAN:   Cancer of sigmoid colon [facility]) # Colon Cancer- stage III- on CALGB 61607; intolerance to celecoxib/placebo discontinued in Oct 2016 secondary to elevated blood sugars.  STABLE.   # CT in Nov 2020- showed no evidence of disease. December 2019 colonoscopy negative. Clinically stable without evidence of recurrence. CEA 1.2 today.  Continue monitoring on a clinical basis; every 12 months. Will inform [facility] re: repeat colonoscopy [? Over due 3 years]  # Diabetes Mellitus- Fasting--170-STABLE;  Continue follow-up with endocrinology.   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 STABLE    Disposition:  #Refer to [place] [facility] GI  regarding follow-up colonoscopy/history of colon cancer # Follow up in 12 months-MD; labs cbc, cmp, cea-Dr. B  Dr.Fath/Dr.Hedrick  # 25 minutes face-to-face with the patient discussing the above plan of care; more than 50% of time spent on prognosis/ natural history;  counseling and coordination.    Cammie Sickle, MD 10/14/2021 9:54 AM

## 2021-10-14 NOTE — Assessment & Plan Note (Addendum)
#   Colon Cancer- stage III- on CALGB 80702; intolerance to celecoxib/placebo discontinued in Oct 2016 secondary to elevated blood sugars.  STABLE.   # CT in Nov 2020- showed no evidence of disease. December 2019 colonoscopy negative. Clinically stable without evidence of recurrence. CEA 1.2 today.  Continue monitoring on a clinical basis; every 12 months. Will inform [facility] re: repeat colonoscopy [? Over due 3 years]  # Diabetes Mellitus- Fasting--170-STABLE;  Continue follow-up with endocrinology.   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 STABLE    Disposition:  #Refer to [facility] GI regarding follow-up colonoscopy/history of colon cancer # Follow up in 12 months-MD; labs cbc, cmp, cea-Dr. B  Dr.Fath/Dr.Hedrick 

## 2021-10-15 LAB — CEA: CEA: 0.9 ng/mL (ref 0.0–4.7)

## 2021-12-24 ENCOUNTER — Encounter: Payer: Self-pay | Admitting: Gastroenterology

## 2021-12-25 ENCOUNTER — Encounter
Admission: RE | Disposition: A | Discharge status: Home / Self Care | Payer: Self-pay | Source: Ambulatory Visit | Attending: Gastroenterology

## 2021-12-25 ENCOUNTER — Ambulatory Visit: Payer: Commercial Managed Care - PPO | Admitting: Anesthesiology

## 2021-12-25 ENCOUNTER — Ambulatory Visit
Admission: RE | Admit: 2021-12-25 | Discharge: 2021-12-25 | Disposition: A | Discharge status: Home / Self Care | Payer: Commercial Managed Care - PPO | Source: Ambulatory Visit | Attending: Gastroenterology | Admitting: Gastroenterology

## 2021-12-25 ENCOUNTER — Encounter: Payer: Self-pay | Admitting: Gastroenterology

## 2021-12-25 DIAGNOSIS — Z6836 Body mass index (BMI) 36.0-36.9, adult: Secondary | ICD-10-CM | POA: Diagnosis not present

## 2021-12-25 DIAGNOSIS — Z08 Encounter for follow-up examination after completed treatment for malignant neoplasm: Secondary | ICD-10-CM | POA: Diagnosis present

## 2021-12-25 DIAGNOSIS — Z980 Intestinal bypass and anastomosis status: Secondary | ICD-10-CM | POA: Insufficient documentation

## 2021-12-25 DIAGNOSIS — E669 Obesity, unspecified: Secondary | ICD-10-CM | POA: Insufficient documentation

## 2021-12-25 DIAGNOSIS — Z800 Family history of malignant neoplasm of digestive organs: Secondary | ICD-10-CM | POA: Insufficient documentation

## 2021-12-25 DIAGNOSIS — D123 Benign neoplasm of transverse colon: Secondary | ICD-10-CM | POA: Insufficient documentation

## 2021-12-25 DIAGNOSIS — R0601 Orthopnea: Secondary | ICD-10-CM | POA: Diagnosis not present

## 2021-12-25 DIAGNOSIS — Z95810 Presence of automatic (implantable) cardiac defibrillator: Secondary | ICD-10-CM | POA: Insufficient documentation

## 2021-12-25 DIAGNOSIS — Z85038 Personal history of other malignant neoplasm of large intestine: Secondary | ICD-10-CM | POA: Insufficient documentation

## 2021-12-25 DIAGNOSIS — I110 Hypertensive heart disease with heart failure: Secondary | ICD-10-CM | POA: Insufficient documentation

## 2021-12-25 DIAGNOSIS — Z7984 Long term (current) use of oral hypoglycemic drugs: Secondary | ICD-10-CM | POA: Insufficient documentation

## 2021-12-25 DIAGNOSIS — I509 Heart failure, unspecified: Secondary | ICD-10-CM | POA: Insufficient documentation

## 2021-12-25 DIAGNOSIS — D124 Benign neoplasm of descending colon: Secondary | ICD-10-CM | POA: Insufficient documentation

## 2021-12-25 DIAGNOSIS — D122 Benign neoplasm of ascending colon: Secondary | ICD-10-CM | POA: Insufficient documentation

## 2021-12-25 DIAGNOSIS — Z794 Long term (current) use of insulin: Secondary | ICD-10-CM | POA: Diagnosis not present

## 2021-12-25 DIAGNOSIS — E119 Type 2 diabetes mellitus without complications: Secondary | ICD-10-CM | POA: Insufficient documentation

## 2021-12-25 DIAGNOSIS — D120 Benign neoplasm of cecum: Secondary | ICD-10-CM | POA: Diagnosis not present

## 2021-12-25 HISTORY — PX: COLONOSCOPY WITH PROPOFOL: SHX5780

## 2021-12-25 LAB — GLUCOSE, CAPILLARY: Glucose-Capillary: 153 mg/dL — ABNORMAL HIGH (ref 70–99)

## 2021-12-25 SURGERY — COLONOSCOPY WITH PROPOFOL
Anesthesia: General

## 2021-12-25 MED ORDER — EPHEDRINE SULFATE (PRESSORS) 50 MG/ML IJ SOLN
INTRAMUSCULAR | Status: DC | PRN
Start: 2021-12-25 — End: 2021-12-25
  Administered 2021-12-25 (×2): 10 mg via INTRAVENOUS
  Administered 2021-12-25: 5 mg via INTRAVENOUS
  Administered 2021-12-25: 10 mg via INTRAVENOUS

## 2021-12-25 MED ORDER — SODIUM CHLORIDE 0.9 % IV SOLN: INTRAVENOUS | Status: DC

## 2021-12-25 MED ORDER — PHENYLEPHRINE HCL (PRESSORS) 10 MG/ML IV SOLN
INTRAVENOUS | Status: DC | PRN
Start: 2021-12-25 — End: 2021-12-25
  Administered 2021-12-25 (×2): 80 ug via INTRAVENOUS
  Administered 2021-12-25: 160 ug via INTRAVENOUS
  Administered 2021-12-25: 80 ug via INTRAVENOUS

## 2021-12-25 MED ORDER — PROPOFOL 500 MG/50ML IV EMUL
INTRAVENOUS | Status: DC | PRN
  Administered 2021-12-25: 75 ug/kg/min via INTRAVENOUS

## 2021-12-25 MED ORDER — VASOPRESSIN 20 UNIT/ML IV SOLN
INTRAVENOUS | Status: DC | PRN
  Administered 2021-12-25 (×2): 2 [IU] via INTRAVENOUS

## 2021-12-25 MED ORDER — LIDOCAINE HCL (PF) 2 % IJ SOLN
INTRAMUSCULAR | Status: AC
  Filled 2021-12-25: qty 5

## 2021-12-25 MED ORDER — MIDAZOLAM HCL 2 MG/2ML IJ SOLN
INTRAMUSCULAR | Status: AC
  Filled 2021-12-25: qty 2

## 2021-12-25 MED ORDER — LIDOCAINE HCL (CARDIAC) PF 100 MG/5ML IV SOSY
PREFILLED_SYRINGE | INTRAVENOUS | Status: DC | PRN
  Administered 2021-12-25: 50 mg via INTRAVENOUS

## 2021-12-25 MED ORDER — PROPOFOL 500 MG/50ML IV EMUL
INTRAVENOUS | Status: AC
  Filled 2021-12-25: qty 50

## 2021-12-25 MED ORDER — PROPOFOL 10 MG/ML IV BOLUS
INTRAVENOUS | Status: DC | PRN
  Administered 2021-12-25: 20 mg via INTRAVENOUS

## 2021-12-25 MED ORDER — PHENYLEPHRINE HCL-NACL 20-0.9 MG/250ML-% IV SOLN
INTRAVENOUS | Status: AC
Start: 2021-12-25 — End: ?
  Filled 2021-12-25: qty 250

## 2021-12-25 MED ORDER — FENTANYL CITRATE (PF) 100 MCG/2ML IJ SOLN
INTRAMUSCULAR | Status: DC | PRN
  Administered 2021-12-25: 50 ug via INTRAVENOUS

## 2021-12-25 MED ORDER — FENTANYL CITRATE (PF) 100 MCG/2ML IJ SOLN
INTRAMUSCULAR | Status: AC
  Filled 2021-12-25: qty 2

## 2021-12-25 MED ORDER — MIDAZOLAM HCL 2 MG/2ML IJ SOLN
INTRAMUSCULAR | Status: DC | PRN
  Administered 2021-12-25: 1 mg via INTRAVENOUS

## 2021-12-25 MED ORDER — EPHEDRINE 5 MG/ML INJ
INTRAVENOUS | Status: AC
  Filled 2021-12-25: qty 5

## 2021-12-25 NOTE — Anesthesia Procedure Notes (Signed)
Date/Time: 12/25/2021 7:41 AM ?Performed by: Johnna Acosta, CRNA ?Pre-anesthesia Checklist: Patient identified, Emergency Drugs available, Suction available, Patient being monitored and Timeout performed ?Patient Re-evaluated:Patient Re-evaluated prior to induction ?Oxygen Delivery Method: Nasal cannula ?Preoxygenation: Pre-oxygenation with 100% oxygen ?Induction Type: IV induction ? ? ? ? ?

## 2021-12-25 NOTE — H&P (Signed)
? ?Pre-Procedure H&P ?  ?Patient ID: [name] [name] is a 66 y.o. male. ? ?Gastroenterology Provider: [name], DO ? ?Referring Provider: Laurine Blazer, PA ?PCP: [place] Pink, MD ? ?Date: 12/25/2021 ? ?HPI ?[name] is a 66 y.o. male who presents today for Colonoscopy for surveillance-phx colon cancer s/p resection . ? ?Patient moving his bowel movements regularly without hematochezia melena diarrhea or constipation. ? 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  He underwent surgical resection of the sigmoid at that time.  He also underwent systemic chemotherapy. ?He has undergone surveillance colonoscopy in 2015 2017 2019 being his last demonstrating 1 tubular adenoma and internal hemorrhoids.  CEA in 2023 was negative ?Most recent lab work creatinine 1.1 hemoglobin 15.6 MCV 92 platelets 215,000 ? ?Denies any anticoagulant antiplatelet NSAID use.  He does have a dual pacemaker defibrillator device ? ?Mother colorectal cancer at 22 years old ? ?Past Medical History:  ?Diagnosis Date  ? AICD (automatic cardioverter/defibrillator) present 04/20/2014  ? Medtronic biventricular ICD model Viva CRT-D.  ? Cardiac arrhythmia 06/22/2013  ? Cardiomyopathy ([place]) 06/22/2013  ? CHF (congestive heart failure) ([place])   ? Colon cancer ([place]) 2014  ? Partial Colon Resection.  ? Diabetes mellitus without complication ([place])   ? Patient takes Metformin  ? Hyperlipidemia   ? Hypertension   ? Neck pain on right side 07/16/2015  ? ? ?Past Surgical History:  ?Procedure Laterality Date  ? Biventricular implantable cardioverter-defbrillator Left 04/27/2014  ? Medtronic biventricular ICD model Consulta CRTD [place], serial [id number]. The right atrial lead is Medtronic [id number], serial # [id number].  The right ventrcular lead is Medtronic [id number], serial # [id number] and the LV lead is [id number] Medtronic serial # [id number]  ? BLADDER SURGERY N/A 06/22/2013  ? "bladder  stretched"  ? CARDIAC CATHETERIZATION    ? CHOLECYSTECTOMY  01/22/2014  ? Dr. Tamala Julian  ? COLECTOMY Left 05/25/2013  ? sigmoid colectomy  ? COLONOSCOPY WITH PROPOFOL N/A 08/03/2016  ? Procedure: COLONOSCOPY WITH PROPOFOL;  Surgeon: Manya Silvas, MD;  Location: [facility] ENDOSCOPY;  Service: Endoscopy;  Laterality: N/A;  ? COLONOSCOPY WITH PROPOFOL N/A 09/21/2018  ? Procedure: COLONOSCOPY WITH PROPOFOL;  Surgeon: Manya Silvas, MD;  Location: [facility] ENDOSCOPY;  Service: Endoscopy;  Laterality: N/A;  ? ICD GENERATOR CHANGEOUT N/A 11/22/2020  ? Procedure: ICD GENERATOR CHANGEOUT;  Surgeon: Marzetta Board, MD;  Location: [facility] CV LAB;  Service: Cardiovascular;  Laterality: N/A;  ? INSERTION CENTRAL VENOUS ACCESS DEVICE W/ SUBCUTANEOUS PORT  2014  ? Dr. Tamala Julian  ? ROTATOR CUFF REPAIR Left 08/2003  ? ? ?Family History ?Mother colorectal cancer at 60 years old ?No h/o GI disease or malignancy ? ?Review of Systems  ?Constitutional:  Negative for activity change, appetite change, chills, diaphoresis, fatigue, fever and unexpected weight change.  ?HENT:  Negative for trouble swallowing and voice change.   ?Respiratory:  Negative for shortness of breath and wheezing.   ?Cardiovascular:  Negative for chest pain, palpitations and leg swelling.  ?Gastrointestinal:  Negative for abdominal distention, abdominal pain, anal bleeding, blood in stool, constipation, diarrhea, nausea and vomiting.  ?Musculoskeletal:  Negative for arthralgias and myalgias.  ?Skin:  Negative for color change and pallor.  ?Neurological:  Negative for dizziness, syncope and weakness.  ?Psychiatric/Behavioral:  Negative for confusion. The patient is not nervous/anxious.   ?All other systems reviewed and are negative.  ? ?Medications ?No current facility-administered medications on file prior to encounter.  ? ?  Current Outpatient Medications on File Prior to Encounter  ?Medication Sig Dispense Refill  ? carvedilol (COREG) 3.125 MG tablet Take 3.125 mg by  mouth 2 (two) times daily with a meal.     ? FARXIGA 10 MG TABS tablet Take 10 mg by mouth every morning.    ? furosemide (LASIX) 20 MG tablet Take 10 mg by mouth daily as needed for edema.    ? glimepiride (AMARYL) 1 MG tablet Take 2 mg by mouth 2 (two) times daily. Take 2 tablets twice daily    ? metFORMIN (GLUCOPHAGE) 500 MG tablet Take 1,000 mg by mouth 2 (two) times daily with a meal.    ? NOVOLOG MIX 70/30 FLEXPEN (70-30) 100 UNIT/ML FlexPen Inject 45-50 Units into the skin See admin instructions. 45 units in the morning, 50 units at bedtime    ? olmesartan-hydrochlorothiazide (BENICAR HCT) 20-12.5 MG per tablet Take 0.5 tablets by mouth daily.    ? simvastatin (ZOCOR) 40 MG tablet Take 40 mg by mouth daily. Once daily at bedtime    ? sitaGLIPtin (JANUVIA) 100 MG tablet Take 100 mg by mouth daily. In the morning    ? traMADol (ULTRAM) 50 MG tablet Take by mouth every 6 (six) hours as needed.    ? glucose blood test strip Use 2 (two) times daily. Use as instructed.    ? ? ?Pertinent medications related to GI and procedure were reviewed by me with the patient prior to the procedure ? ? ?Current Facility-Administered Medications:  ?  0.9 %  sodium chloride infusion, , Intravenous, Continuous, [name], DO, Last Rate: 20 mL/hr at 12/25/21 0708, New Bag at 12/25/21 0708 ? sodium chloride 20 mL/hr at 12/25/21 0708  ?  ?  ? ?No Known Allergies ?Allergies were reviewed by me prior to the procedure ? ?Objective  ? ? ?Vitals:  ? 12/25/21 0658  ?BP: 113/75  ?Pulse: 83  ?Resp: 18  ?Temp: (!) 96.7 ?F (35.9 ?C)  ?TempSrc: Temporal  ?SpO2: 98%  ?Weight: 111.1 kg  ?Height: '5\' 9"'$  (1.753 m)  ? ? ? ?Physical Exam ?Vitals and nursing note reviewed.  ?Constitutional:   ?   General: He is not in acute distress. ?   Appearance: Normal appearance. He is obese. He is not ill-appearing, toxic-appearing or diaphoretic.  ?HENT:  ?   Head: Normocephalic and atraumatic.  ?   Nose: Nose normal.  ?   Mouth/Throat:  ?   Mouth:  Mucous membranes are moist.  ?   Pharynx: Oropharynx is clear.  ?Eyes:  ?   General: No scleral icterus. ?   Extraocular Movements: Extraocular movements intact.  ?Cardiovascular:  ?   Rate and Rhythm: Normal rate and regular rhythm.  ?   Heart sounds: Normal heart sounds. No murmur heard. ?  No friction rub. No gallop.  ?   Comments: Pacemaker defibrillator and left chest wall ?Pulmonary:  ?   Effort: Pulmonary effort is normal. No respiratory distress.  ?   Breath sounds: Normal breath sounds. No wheezing, rhonchi or rales.  ?Abdominal:  ?   General: Bowel sounds are normal. There is no distension.  ?   Palpations: Abdomen is soft.  ?   Tenderness: There is no abdominal tenderness. There is no guarding or rebound.  ?Musculoskeletal:  ?   Cervical back: Neck supple.  ?   Right lower leg: No edema.  ?   Left lower leg: No edema.  ?Skin: ?   General: Skin is  warm and dry.  ?   Coloration: Skin is not jaundiced or pale.  ?Neurological:  ?   General: No focal deficit present.  ?   Mental Status: He is alert and oriented to person, place, and time. Mental status is at baseline.  ?Psychiatric:     ?   Mood and Affect: Mood normal.     ?   Behavior: Behavior normal.     ?   Thought Content: Thought content normal.     ?   Judgment: Judgment normal.  ? ? ? ?Assessment:  ?[name] [name] is a 66 y.o. male  who presents today for Colonoscopy for surveillance-personal history of colon cancer ? ?Plan:  ?Colonoscopy with possible intervention today ? ?Colonoscopy with possible biopsy, control of bleeding, polypectomy, and interventions as necessary has been discussed with the patient/patient representative. Informed consent was obtained from the patient/patient representative after explaining the indication, nature, and risks of the procedure including but not limited to death, bleeding, perforation, missed neoplasm/lesions, cardiorespiratory compromise, and reaction to medications. Opportunity for questions was given and  appropriate answers were provided. Patient/patient representative has verbalized understanding is amenable to undergoing the procedure. ? ? ?[name], DO  ?[facility] ? ?[place]

## 2021-12-25 NOTE — Transfer of Care (Signed)
Immediate Anesthesia Transfer of Care Note ? ?Patient: [name] [name] ? ?Procedure(s) Performed: COLONOSCOPY WITH PROPOFOL ? ?Patient Location: PACU ? ?Anesthesia Type:General ? ?Level of Consciousness: awake, alert  and oriented ? ?Airway & Oxygen Therapy: Patient Spontanous Breathing ? ?Post-op Assessment: Report given to RN and Post -op Vital signs reviewed and stable ? ?Post vital signs: Reviewed and stable ? ?Last Vitals:  ?Vitals Value Taken Time  ?BP 89/50 12/25/21 0835  ?Temp 35.9 ?C 12/25/21 0830  ?Pulse 81 12/25/21 0835  ?Resp 19 12/25/21 0835  ?SpO2 98 % 12/25/21 0835  ? ? ?Last Pain:  ?Vitals:  ? 12/25/21 0830  ?TempSrc: Temporal  ?PainSc:   ?   ? ?  ? ?Complications: No notable events documented. ?

## 2021-12-25 NOTE — Anesthesia Postprocedure Evaluation (Signed)
Anesthesia Post Note ? ?Patient: [name] [name] ? ?Procedure(s) Performed: COLONOSCOPY WITH PROPOFOL ? ?Patient location during evaluation: Endoscopy ?Anesthesia Type: General ?Level of consciousness: awake and alert ?Pain management: pain level controlled ?Vital Signs Assessment: post-procedure vital signs reviewed and stable ?Respiratory status: spontaneous breathing, nonlabored ventilation, respiratory function stable and patient connected to nasal cannula oxygen ?Cardiovascular status: blood pressure returned to baseline and stable ?Postop Assessment: no apparent nausea or vomiting ?Anesthetic complications: no ? ? ?No notable events documented. ? ? ?Last Vitals:  ?Vitals:  ? 12/25/21 0850 12/25/21 0900  ?BP: 111/70 107/73  ?Pulse: 76 73  ?Resp: (!) 21 (!) 22  ?Temp:    ?SpO2: 97% 94%  ?  ?Last Pain:  ?Vitals:  ? 12/25/21 0830  ?TempSrc: Temporal  ?PainSc:   ? ? ?  ?  ?  ?  ?  ?  ? ?Arita Miss ? ? ? ? ?

## 2021-12-25 NOTE — Op Note (Signed)
[facility] ?Gastroenterology ?Patient Name: [name] [name] ?Procedure Date: 12/25/2021 7:31 AM ?MRN: [id number] ?Account #: [id number] ?Date of Birth: 12-26-1955 ?Admit Type: Outpatient ?Age: 66 ?Room: [facility] ENDO ROOM 2 ?Gender: Male ?Note Status: Finalized ?Instrument Name: Colonoscope [id number] ?Procedure:             Colonoscopy ?Indications:           High risk colon cancer surveillance: Personal history  ?                       of colon cancer ?Providers:             [name] DO, DO ?Referring MD:          Irven Easterly. Kary Kos, MD (Referring MD) ?Medicines:             Monitored Anesthesia Care ?Complications:         No immediate complications. Estimated blood loss:  ?                       Minimal. ?Procedure:             Pre-Anesthesia Assessment: ?                       - Prior to the procedure, a History and Physical was  ?                       performed, and patient medications and allergies were  ?                       reviewed. The patient is competent. The risks and  ?                       benefits of the procedure and the sedation options and  ?                       risks were discussed with the patient. All questions  ?                       were answered and informed consent was obtained.  ?                       Patient identification and proposed procedure were  ?                       verified by the physician, the nurse, the anesthetist  ?                       and the technician in the endoscopy suite. Mental  ?                       Status Examination: alert and oriented. Airway  ?                       Examination: normal oropharyngeal airway and neck  ?                       mobility. Respiratory Examination: clear to  ?  auscultation. CV Examination: RRR, no murmurs, no S3  ?                       or S4. Prophylactic Antibiotics: The patient does not  ?                       require prophylactic antibiotics. Prior  ?                        Anticoagulants: The patient has taken no previous  ?                       anticoagulant or antiplatelet agents. ASA Grade  ?                       Assessment: III - A patient with severe systemic  ?                       disease. After reviewing the risks and benefits, the  ?                       patient was deemed in satisfactory condition to  ?                       undergo the procedure. The anesthesia plan was to use  ?                       monitored anesthesia care (MAC). Immediately prior to  ?                       administration of medications, the patient was  ?                       re-assessed for adequacy to receive sedatives. The  ?                       heart rate, respiratory rate, oxygen saturations,  ?                       blood pressure, adequacy of pulmonary ventilation, and  ?                       response to care were monitored throughout the  ?                       procedure. The physical status of the patient was  ?                       re-assessed after the procedure. ?                       After obtaining informed consent, the colonoscope was  ?                       passed under direct vision. Throughout the procedure,  ?                       the patient's blood pressure, pulse, and oxygen  ?  saturations were monitored continuously. The  ?                       Colonoscope was introduced through the anus and  ?                       advanced to the the cecum, identified by appendiceal  ?                       orifice and ileocecal valve. The colonoscopy was  ?                       performed without difficulty. The patient tolerated  ?                       the procedure well. The quality of the bowel  ?                       preparation was evaluated using the BBPS [facility] Bowel  ?                       Preparation Scale) with scores of: Right Colon = 2  ?                       (minor amount of residual staining, small fragments of  ?                        stool and/or opaque liquid, but mucosa seen well),  ?                       Transverse Colon = 2 (minor amount of residual  ?                       staining, small fragments of stool and/or opaque  ?                       liquid, but mucosa seen well) and Left Colon = 2  ?                       (minor amount of residual staining, small fragments of  ?                       stool and/or opaque liquid, but mucosa seen well). The  ?                       total BBPS score equals 6. The quality of the bowel  ?                       preparation was good. The ileocecal valve, appendiceal  ?                       orifice, and rectum were photographed. ?Findings: ?     The perianal and digital rectal examinations were normal. Pertinent  ?     negatives include normal sphincter tone. ?     Three sessile polyps were found in the transverse colon, ascending colon  ?     and cecum. The polyps were  1 to 2 mm in size. These polyps were removed  ?     with a cold biopsy forceps. Resection and retrieval were complete.  ?     Estimated blood loss was minimal. ?     Three sessile polyps were found in the descending colon, transverse  ?     colon and ascending colon. The polyps were 3 to 4 mm in size. These  ?     polyps were removed with a cold snare. Resection and retrieval were  ?     complete. Estimated blood loss was minimal. ?     A 2 to 3 mm polyp was found in the transverse colon. The polyp was  ?     pedunculated. The polyp was removed with a hot snare. Resection and  ?     retrieval were complete. Estimated blood loss was minimal. ?     There was evidence of a prior functional end-to-end colo-colonic  ?     anastomosis at 20 cm proximal to the anus. This was patent and was  ?     characterized by healthy appearing mucosa. The anastomosis was  ?     traversed. Estimated blood loss: none. ?     The exam was otherwise without abnormality on direct and retroflexion  ?     views. ?     There was moderate spasm in the ascending  colon. Estimated blood loss:  ?     none. ?Impression:            - Three 1 to 2 mm polyps in the transverse colon, in  ?                       the ascending colon and in the cecum, removed with a  ?                       cold biopsy forceps. Resected and retrieved. ?                       - Three 3 to 4 mm polyps in the descending colon, in  ?                       the transverse colon and in the ascending colon,  ?                       removed with a cold snare. Resected and retrieved. ?                       - One 2 to 3 mm polyp in the transverse colon, removed  ?                       with a hot snare. Resected and retrieved. ?                       - Patent functional end-to-end colo-colonic  ?                       anastomosis, characterized by healthy appearing mucosa. ?                       - The examination was otherwise normal on direct and  ?  retroflexion views. ?                       - Moderate colonic spasm. ?Recommendation:        - Discharge patient to home. ?                       - Resume previous diet. ?                       - No aspirin, ibuprofen, naproxen, or other  ?                       non-steroidal anti-inflammatory drugs for 5 days after  ?                       polyp removal. ?                       - Continue present medications. ?                       - Await pathology results. ?                       - Repeat colonoscopy in 1 year for surveillance. ?                       - Return to referring physician as previously  ?                       scheduled. ?                       - The findings and recommendations were discussed with  ?                       the patient. ?Procedure Code(s):     --- Professional --- ?                       [phone], Colonoscopy, flexible; with removal of  ?                       tumor(s), polyp(s), or other lesion(s) by snare  ?                       technique ?                       [id number], 59, Colonoscopy, flexible; with biopsy,  single  ?                       or multiple ?Diagnosis Code(s):     --- Professional --- ?                       K63.5, Polyp of colon ?                       Z85.038, Personal history of other malignant neoplasm  ?

## 2021-12-25 NOTE — Anesthesia Preprocedure Evaluation (Signed)
Anesthesia Evaluation  ?Patient identified by MRN, date of birth, ID band ?Patient awake ? ? ? ?Reviewed: ?Allergy & Precautions, NPO status , Patient's Chart, lab work & pertinent test results ? ?History of Anesthesia Complications ?Negative for: history of anesthetic complications ? ?Airway ?Mallampati: III ? ?TM Distance: >3 FB ?Neck ROM: Full ? ? ? Dental ?no notable dental hx. ?(+) Teeth Intact ?  ?Pulmonary ?neg pulmonary ROS, neg sleep apnea, neg COPD,  ?  ?breath sounds clear to auscultation- rhonchi ?(-) wheezing ? ? ? ? ? Cardiovascular ?hypertension, Pt. on medications ?+CHF and + Orthopnea  ?(-) CAD, (-) Past MI, (-) Cardiac Stents and (-) CABG + Cardiac Defibrillator ? ?Rhythm:Regular Rate:Normal ?- Systolic murmurs and - Diastolic murmurs ?Echo 11/20/16: ?SEVERE LV SYSTOLIC DYSFUNCTION  ?NORMAL RIGHT VENTRICULAR SYSTOLIC FUNCTION ?MILD VALVULAR REGURGITATION  ?NO VALVULAR STENOSIS ?PACER WIRE NOTED ?Contraction: SEVERE GLOBAL DECREASE ?Closest EF: 25% (Estimated) ?Size: SEVERELY ENLARGED ?Size: MILDLY ENLARGED ?Aortic: TRIVIAL AR ?Mitral: MILD MR ?Tricuspid: MILD TR ?  ?Neuro/Psych ?neg Seizures negative neurological ROS ? negative psych ROS  ? GI/Hepatic ?negative GI ROS, Neg liver ROS,   ?Endo/Other  ?diabetes, Insulin Dependent, Oral Hypoglycemic Agents ? Renal/GU ?negative Renal ROS  ? ?  ?Musculoskeletal ?negative musculoskeletal ROS ?(+)  ? Abdominal ?(+) + obese,   ?Peds ? Hematology ?negative hematology ROS ?(+)   ?Anesthesia Other Findings ?Past Medical History: ?04/20/2014: AICD (automatic cardioverter/defibrillator) present ?    Comment:  Medtronic biventricular ICD model Viva CRT-D. ?06/22/2013: Cardiac arrhythmia ?06/22/2013: Cardiomyopathy ([place]) ?No date: CHF (congestive heart failure) ([place]) ?2014: Colon cancer ([place]) ?    Comment:  Partial Colon Resection. ?No date: Diabetes mellitus without complication ([place]) ?    Comment:  Patient takes Metformin ?No  date: Hyperlipidemia ?No date: Hypertension ?07/16/2015: Neck pain on right side ? ? Reproductive/Obstetrics ? ?  ? ? ? ? ? ? ? ? ? ? ? ? ? ?  ?  ? ? ? ? ? ? ? ? ?Anesthesia Physical ? ?Anesthesia Plan ? ?ASA: 3 ? ?Anesthesia Plan: General  ? ?Post-op Pain Management: Minimal or no pain anticipated  ? ?Induction: Intravenous ? ?PONV Risk Score and Plan: 2 and Propofol infusion and Midazolam ? ?Airway Management Planned: Natural Airway ? ?Additional Equipment: None ? ?Intra-op Plan:  ? ?Post-operative Plan:  ? ?Informed Consent: I have reviewed the patients History and Physical, chart, labs and discussed the procedure including the risks, benefits and alternatives for the proposed anesthesia with the patient or authorized representative who has indicated his/her understanding and acceptance.  ? ? ? ?Dental advisory given ? ?Plan Discussed with: CRNA and Anesthesiologist ? ?Anesthesia Plan Comments: (Discussed risks of anesthesia with patient, including possibility of difficulty with spontaneous ventilation under anesthesia necessitating airway intervention, PONV, and rare risks such as cardiac or respiratory or neurological events, and allergic reactions. Discussed the role of CRNA in patient's perioperative care. Patient understands. ?Patient counseled on being higher risk for anesthesia due to comorbidities: HFrEF. Patient was told about increased risk of cardiac and respiratory events, including death. ?)  ? ? ? ? ? ? ?Anesthesia Quick Evaluation ? ?

## 2021-12-25 NOTE — Interval H&P Note (Signed)
History and Physical Interval Note: Preprocedure H&P from 12/25/21 ? was reviewed and there was no interval change after seeing and examining the patient.  Written consent was obtained from the patient after discussion of risks, benefits, and alternatives. Patient has consented to proceed with Colonoscopy with possible intervention ? ? ?12/25/2021 ?7:40 AM ? ?[name] [name]  has presented today for surgery, with the diagnosis of Z85.038 (ICD-10-CM) - Personal history of colon cancer.  The various methods of treatment have been discussed with the patient and family. After consideration of risks, benefits and other options for treatment, the patient has consented to  Procedure(s) with comments: ?COLONOSCOPY WITH PROPOFOL (N/A) - IDDM as a surgical intervention.  The patient's history has been reviewed, patient examined, no change in status, stable for surgery.  I have reviewed the patient's chart and labs.  Questions were answered to the patient's satisfaction.   ? ? ?[name] ? ? ?

## 2021-12-26 ENCOUNTER — Encounter: Payer: Self-pay | Admitting: Gastroenterology

## 2021-12-26 LAB — SURGICAL PATHOLOGY

## 2022-04-16 DIAGNOSIS — N182 Chronic kidney disease, stage 2 (mild): Secondary | ICD-10-CM | POA: Diagnosis not present

## 2022-04-16 DIAGNOSIS — Z794 Long term (current) use of insulin: Secondary | ICD-10-CM | POA: Diagnosis not present

## 2022-04-16 DIAGNOSIS — E1122 Type 2 diabetes mellitus with diabetic chronic kidney disease: Secondary | ICD-10-CM | POA: Diagnosis not present

## 2022-04-20 DIAGNOSIS — E782 Mixed hyperlipidemia: Secondary | ICD-10-CM | POA: Diagnosis not present

## 2022-04-20 DIAGNOSIS — R251 Tremor, unspecified: Secondary | ICD-10-CM | POA: Diagnosis not present

## 2022-04-20 DIAGNOSIS — Z794 Long term (current) use of insulin: Secondary | ICD-10-CM | POA: Diagnosis not present

## 2022-04-20 DIAGNOSIS — I10 Essential (primary) hypertension: Secondary | ICD-10-CM | POA: Diagnosis not present

## 2022-04-20 DIAGNOSIS — I420 Dilated cardiomyopathy: Secondary | ICD-10-CM | POA: Diagnosis not present

## 2022-04-20 DIAGNOSIS — I5022 Chronic systolic (congestive) heart failure: Secondary | ICD-10-CM | POA: Diagnosis not present

## 2022-04-20 DIAGNOSIS — N1831 Chronic kidney disease, stage 3a: Secondary | ICD-10-CM | POA: Diagnosis not present

## 2022-04-20 DIAGNOSIS — Z0000 Encounter for general adult medical examination without abnormal findings: Secondary | ICD-10-CM | POA: Diagnosis not present

## 2022-04-20 DIAGNOSIS — E1165 Type 2 diabetes mellitus with hyperglycemia: Secondary | ICD-10-CM | POA: Diagnosis not present

## 2022-04-20 DIAGNOSIS — E1122 Type 2 diabetes mellitus with diabetic chronic kidney disease: Secondary | ICD-10-CM | POA: Diagnosis not present

## 2022-05-12 DIAGNOSIS — E1122 Type 2 diabetes mellitus with diabetic chronic kidney disease: Secondary | ICD-10-CM | POA: Diagnosis not present

## 2022-05-12 DIAGNOSIS — I10 Essential (primary) hypertension: Secondary | ICD-10-CM | POA: Diagnosis not present

## 2022-05-12 DIAGNOSIS — N1831 Chronic kidney disease, stage 3a: Secondary | ICD-10-CM | POA: Diagnosis not present

## 2022-05-12 DIAGNOSIS — N182 Chronic kidney disease, stage 2 (mild): Secondary | ICD-10-CM | POA: Diagnosis not present

## 2022-05-12 DIAGNOSIS — Z794 Long term (current) use of insulin: Secondary | ICD-10-CM | POA: Diagnosis not present

## 2022-06-15 DIAGNOSIS — K219 Gastro-esophageal reflux disease without esophagitis: Secondary | ICD-10-CM | POA: Diagnosis not present

## 2022-06-15 DIAGNOSIS — Z85038 Personal history of other malignant neoplasm of large intestine: Secondary | ICD-10-CM | POA: Diagnosis not present

## 2022-07-14 DIAGNOSIS — I420 Dilated cardiomyopathy: Secondary | ICD-10-CM | POA: Diagnosis not present

## 2022-08-24 ENCOUNTER — Encounter: Payer: Self-pay | Admitting: Gastroenterology

## 2022-08-24 ENCOUNTER — Ambulatory Visit: Payer: PPO | Admitting: Certified Registered Nurse Anesthetist

## 2022-08-24 ENCOUNTER — Encounter
Admission: RE | Disposition: A | Discharge status: Home / Self Care | Payer: Self-pay | Source: Home / Self Care | Attending: Gastroenterology

## 2022-08-24 ENCOUNTER — Ambulatory Visit
Admission: RE | Admit: 2022-08-24 | Discharge: 2022-08-24 | Disposition: A | Discharge status: Home / Self Care | Payer: PPO | Attending: Gastroenterology | Admitting: Gastroenterology

## 2022-08-24 DIAGNOSIS — Z1211 Encounter for screening for malignant neoplasm of colon: Secondary | ICD-10-CM | POA: Diagnosis not present

## 2022-08-24 DIAGNOSIS — I110 Hypertensive heart disease with heart failure: Secondary | ICD-10-CM | POA: Insufficient documentation

## 2022-08-24 DIAGNOSIS — D122 Benign neoplasm of ascending colon: Secondary | ICD-10-CM | POA: Insufficient documentation

## 2022-08-24 DIAGNOSIS — K640 First degree hemorrhoids: Secondary | ICD-10-CM | POA: Diagnosis not present

## 2022-08-24 DIAGNOSIS — Z79899 Other long term (current) drug therapy: Secondary | ICD-10-CM | POA: Diagnosis not present

## 2022-08-24 DIAGNOSIS — D126 Benign neoplasm of colon, unspecified: Secondary | ICD-10-CM | POA: Diagnosis not present

## 2022-08-24 DIAGNOSIS — Z9049 Acquired absence of other specified parts of digestive tract: Secondary | ICD-10-CM | POA: Diagnosis not present

## 2022-08-24 DIAGNOSIS — Z95810 Presence of automatic (implantable) cardiac defibrillator: Secondary | ICD-10-CM | POA: Insufficient documentation

## 2022-08-24 DIAGNOSIS — E785 Hyperlipidemia, unspecified: Secondary | ICD-10-CM | POA: Insufficient documentation

## 2022-08-24 DIAGNOSIS — Z85038 Personal history of other malignant neoplasm of large intestine: Secondary | ICD-10-CM | POA: Diagnosis not present

## 2022-08-24 DIAGNOSIS — Z794 Long term (current) use of insulin: Secondary | ICD-10-CM | POA: Diagnosis not present

## 2022-08-24 DIAGNOSIS — Z7984 Long term (current) use of oral hypoglycemic drugs: Secondary | ICD-10-CM | POA: Diagnosis not present

## 2022-08-24 DIAGNOSIS — I429 Cardiomyopathy, unspecified: Secondary | ICD-10-CM | POA: Diagnosis not present

## 2022-08-24 DIAGNOSIS — Z86010 Personal history of colonic polyps: Secondary | ICD-10-CM | POA: Diagnosis not present

## 2022-08-24 DIAGNOSIS — E669 Obesity, unspecified: Secondary | ICD-10-CM | POA: Diagnosis not present

## 2022-08-24 DIAGNOSIS — Z6835 Body mass index (BMI) 35.0-35.9, adult: Secondary | ICD-10-CM | POA: Insufficient documentation

## 2022-08-24 DIAGNOSIS — I509 Heart failure, unspecified: Secondary | ICD-10-CM | POA: Insufficient documentation

## 2022-08-24 DIAGNOSIS — D124 Benign neoplasm of descending colon: Secondary | ICD-10-CM | POA: Insufficient documentation

## 2022-08-24 DIAGNOSIS — E119 Type 2 diabetes mellitus without complications: Secondary | ICD-10-CM | POA: Insufficient documentation

## 2022-08-24 DIAGNOSIS — K648 Other hemorrhoids: Secondary | ICD-10-CM | POA: Diagnosis not present

## 2022-08-24 DIAGNOSIS — K635 Polyp of colon: Secondary | ICD-10-CM | POA: Diagnosis not present

## 2022-08-24 HISTORY — PX: COLONOSCOPY WITH PROPOFOL: SHX5780

## 2022-08-24 LAB — GLUCOSE, CAPILLARY: Glucose-Capillary: 184 mg/dL — ABNORMAL HIGH (ref 70–99)

## 2022-08-24 SURGERY — COLONOSCOPY WITH PROPOFOL
Anesthesia: General

## 2022-08-24 MED ORDER — SODIUM CHLORIDE 0.9 % IV SOLN: INTRAVENOUS | Status: DC

## 2022-08-24 MED ORDER — EPHEDRINE SULFATE (PRESSORS) 50 MG/ML IJ SOLN
INTRAMUSCULAR | Status: DC | PRN
  Administered 2022-08-24 (×2): 10 mg via INTRAVENOUS
  Administered 2022-08-24: 5 mg via INTRAVENOUS

## 2022-08-24 MED ORDER — LIDOCAINE HCL (CARDIAC) PF 100 MG/5ML IV SOSY
PREFILLED_SYRINGE | INTRAVENOUS | Status: DC | PRN
  Administered 2022-08-24: 50 mg via INTRAVENOUS

## 2022-08-24 MED ORDER — PROPOFOL 10 MG/ML IV BOLUS
INTRAVENOUS | Status: DC | PRN
  Administered 2022-08-24: 80 mg via INTRAVENOUS

## 2022-08-24 MED ORDER — PHENYLEPHRINE 80 MCG/ML (10ML) SYRINGE FOR IV PUSH (FOR BLOOD PRESSURE SUPPORT)
PREFILLED_SYRINGE | INTRAVENOUS | Status: DC | PRN
  Administered 2022-08-24: 80 ug via INTRAVENOUS
  Administered 2022-08-24: 160 ug via INTRAVENOUS
  Administered 2022-08-24 (×2): 80 ug via INTRAVENOUS
  Administered 2022-08-24 (×2): 160 ug via INTRAVENOUS

## 2022-08-24 MED ORDER — PROPOFOL 500 MG/50ML IV EMUL
INTRAVENOUS | Status: DC | PRN
  Administered 2022-08-24: 150 ug/kg/min via INTRAVENOUS

## 2022-08-24 NOTE — Transfer of Care (Signed)
Immediate Anesthesia Transfer of Care Note  Patient: [name] [name]  Procedure(s) Performed: COLONOSCOPY WITH PROPOFOL  Patient Location: PACU  Anesthesia Type:General  Level of Consciousness: sedated  Airway & Oxygen Therapy: Patient Spontanous Breathing  Post-op Assessment: Report given to RN and Post -op Vital signs reviewed and stable  Post vital signs: Reviewed and stable  Last Vitals:  Vitals Value Taken Time  BP 108/65 08/24/22 1035  Temp    Pulse 62 08/24/22 1035  Resp 16 08/24/22 1035  SpO2 95 % 08/24/22 1035    Last Pain:  Vitals:   08/24/22 0852  TempSrc: Temporal         Complications: No notable events documented.

## 2022-08-24 NOTE — Anesthesia Postprocedure Evaluation (Signed)
Anesthesia Post Note  Patient: [name] [name]  Procedure(s) Performed: COLONOSCOPY WITH PROPOFOL  Patient location during evaluation: PACU Anesthesia Type: General Level of consciousness: awake Pain management: satisfactory to patient Vital Signs Assessment: post-procedure vital signs reviewed and stable Respiratory status: nonlabored ventilation and respiratory function stable Cardiovascular status: stable Anesthetic complications: no  No notable events documented.   Last Vitals:  Vitals:   08/24/22 1048 08/24/22 1055  BP: (!) 86/59 124/80  Pulse: 82 76  Resp: (!) 22 19  Temp:    SpO2: 97% 97%    Last Pain:  Vitals:   08/24/22 1048  TempSrc:   PainSc: 0-No pain                 VAN STAVEREN,Nelsy Madonna

## 2022-08-24 NOTE — Anesthesia Preprocedure Evaluation (Signed)
Anesthesia Evaluation  Patient identified by MRN, date of birth, ID band Patient awake    Reviewed: Allergy & Precautions, NPO status , Patient's Chart, lab work & pertinent test results  Airway Mallampati: II  TM Distance: >3 FB Neck ROM: full    Dental  (+) Teeth Intact   Pulmonary neg pulmonary ROS   Pulmonary exam normal breath sounds clear to auscultation       Cardiovascular Exercise Tolerance: Poor hypertension, Pt. on medications +CHF and + DOE  negative cardio ROS Normal cardiovascular exam+ Cardiac Defibrillator III Rhythm:Regular     Neuro/Psych negative neurological ROS  negative psych ROS   GI/Hepatic negative GI ROS, Neg liver ROS,,,  Endo/Other  negative endocrine ROSdiabetes, Insulin Dependent  Morbid obesity  Renal/GU negative Renal ROS  negative genitourinary   Musculoskeletal negative musculoskeletal ROS (+)    Abdominal  (+) + obese  Peds negative pediatric ROS (+)  Hematology negative hematology ROS (+)   Anesthesia Other Findings Past Medical History: 04/20/2014: AICD (automatic cardioverter/defibrillator) present     Comment:  Medtronic biventricular ICD model Viva CRT-D. 06/22/2013: Cardiac arrhythmia 06/22/2013: Cardiomyopathy ([place]) No date: CHF (congestive heart failure) ([place]) 2014: Colon cancer ([place])     Comment:  Partial Colon Resection. No date: Diabetes mellitus without complication (HCC)     Comment:  Patient takes Metformin No date: Hyperlipidemia No date: Hypertension 07/16/2015: Neck pain on right side  Past Surgical History: 04/27/2014: Biventricular implantable cardioverter-defbrillator; Left     Comment:  Medtronic biventricular ICD model Consulta CRTD [place],              serial #[id number]. The right atrial lead is Medtronic               [id number], serial # [id number].  The right ventrcular lead is               Medtronic [id number], serial # [id number] and the LV lead is                4196 Medtronic serial # [id number] 06/22/2013: BLADDER SURGERY; N/A     Comment:  "bladder stretched" No date: CARDIAC CATHETERIZATION 01/22/2014: CHOLECYSTECTOMY     Comment:  Dr. Tamala Julian 05/25/2013: COLECTOMY; Left     Comment:  sigmoid colectomy 08/03/2016: COLONOSCOPY WITH PROPOFOL; N/A     Comment:  Procedure: COLONOSCOPY WITH PROPOFOL;  Surgeon: Manya Silvas, MD;  Location: [facility] ENDOSCOPY;  Service:               Endoscopy;  Laterality: N/A; 09/21/2018: COLONOSCOPY WITH PROPOFOL; N/A     Comment:  Procedure: COLONOSCOPY WITH PROPOFOL;  Surgeon: Manya Silvas, MD;  Location: [facility] ENDOSCOPY;  Service:               Endoscopy;  Laterality: N/A; 12/25/2021: COLONOSCOPY WITH PROPOFOL; N/A     Comment:  Procedure: COLONOSCOPY WITH PROPOFOL;  Surgeon: [name], DO;  Location: [facility] ENDOSCOPY;  Service:               Gastroenterology;  Laterality: N/A;  IDDM 11/22/2020: ICD GENERATOR CHANGEOUT; N/A     Comment:  Procedure: ICD GENERATOR CHANGEOUT;  Surgeon: Marcello Moores,  Priscille Heidelberg, MD;  Location: [place] CV LAB;  Service:               Cardiovascular;  Laterality: N/A; 2014: INSERTION CENTRAL VENOUS ACCESS DEVICE W/ SUBCUTANEOUS PORT     Comment:  Dr. Tamala Julian 08/2003: ROTATOR CUFF REPAIR; Left  BMI    Body Mass Index: 35.44 kg/m      Reproductive/Obstetrics negative OB ROS                             Anesthesia Physical Anesthesia Plan  ASA: 3  Anesthesia Plan: General   Post-op Pain Management:    Induction: Intravenous  PONV Risk Score and Plan: Propofol infusion and TIVA  Airway Management Planned: Natural Airway  Additional Equipment:   Intra-op Plan:   Post-operative Plan:   Informed Consent: I have reviewed the patients History and Physical, chart, labs and discussed the procedure including the risks, benefits and alternatives for the proposed anesthesia  with the patient or authorized representative who has indicated his/her understanding and acceptance.     Dental Advisory Given  Plan Discussed with: CRNA and Surgeon  Anesthesia Plan Comments:        Anesthesia Quick Evaluation

## 2022-08-24 NOTE — H&P (Signed)
Pre-Procedure H&P   Patient ID: [name] [name] is a 66 y.o. male.  Gastroenterology Provider: [name], DO  Referring Provider: Laurine Blazer, PA PCP: Maryland Pink, MD  Date: 08/24/2022  HPI [name] is a 66 y.o. male who presents today for Colonoscopy for surveillance-personal history of colon cancer.  Patient with history of heart failure status post pacemaker and ICD placement.  Undergoing surveillance colonoscopy for personal history of colon cancer.  He is moving his stools regularly every 2 to 3 days without melena or hematochezia.  Appetite and weight have been stable.  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  Underwent partial colectomy with 1 out of 14 lymph nodes positive.  Margins were negative for disease.  He underwent a colonoscopy in March 2023 demonstrating both tubular adenomas and tubulovillous adenomas with inadequate prep.  Undergoing repeat today for further evaluation  A1c 7.6 creatinine 1.4 hemoglobin 15.6 MCV 92 platelets 215,000   Past Medical History:  Diagnosis Date   AICD (automatic cardioverter/defibrillator) present 04/20/2014   Medtronic biventricular ICD model Viva CRT-D.   Cardiac arrhythmia 06/22/2013   Cardiomyopathy ([place]) 06/22/2013   CHF (congestive heart failure) ([place])    Colon cancer ([place]) 2014   Partial Colon Resection.   Diabetes mellitus without complication [facility])    Patient takes Metformin   Hyperlipidemia    Hypertension    Neck pain on right side 07/16/2015    Past Surgical History:  Procedure Laterality Date   Biventricular implantable cardioverter-defbrillator Left 04/27/2014   Medtronic biventricular ICD model Consulta CRTD [place], serial #[id number]. The right atrial lead is Medtronic C338645, serial # [id number].  The right ventrcular lead is Medtronic [id number], serial # [id number] and the LV lead is 4196 Medtronic  serial # [id number]   BLADDER SURGERY N/A 06/22/2013   "bladder stretched"   CARDIAC CATHETERIZATION     CHOLECYSTECTOMY  01/22/2014   Dr. Tamala Julian   COLECTOMY Left 05/25/2013   sigmoid colectomy   COLONOSCOPY WITH PROPOFOL N/A 08/03/2016   Procedure: COLONOSCOPY WITH PROPOFOL;  Surgeon: Manya Silvas, MD;  Location: [facility] ENDOSCOPY;  Service: Endoscopy;  Laterality: N/A;   COLONOSCOPY WITH PROPOFOL N/A 09/21/2018   Procedure: COLONOSCOPY WITH PROPOFOL;  Surgeon: Manya Silvas, MD;  Location: [facility] ENDOSCOPY;  Service: Endoscopy;  Laterality: N/A;   COLONOSCOPY WITH PROPOFOL N/A 12/25/2021   Procedure: COLONOSCOPY WITH PROPOFOL;  Surgeon: [name], DO;  Location: [facility] ENDOSCOPY;  Service: Gastroenterology;  Laterality: N/A;  IDDM   ICD GENERATOR CHANGEOUT N/A 11/22/2020   Procedure: ICD GENERATOR CHANGEOUT;  Surgeon: Marzetta Board, MD;  Location: [place] CV LAB;  Service: Cardiovascular;  Laterality: N/A;   INSERTION CENTRAL VENOUS ACCESS DEVICE W/ SUBCUTANEOUS PORT  2014   Dr. Benard Halsted CUFF REPAIR Left 08/2003    Family History Crc- mother- 65 y/o No h/o GI disease or malignancy  Review of Systems  Constitutional:  Negative for activity change, appetite change, chills, diaphoresis, fatigue, fever and unexpected weight change.  HENT:  Negative for trouble swallowing and voice change.   Respiratory:  Negative for shortness of breath and wheezing.   Cardiovascular:  Negative for chest pain, palpitations and leg swelling.  Gastrointestinal:  Negative for abdominal distention, abdominal pain, anal bleeding, blood in stool, constipation, diarrhea, nausea and vomiting.  Musculoskeletal:  Negative for arthralgias and myalgias.  Skin:  Negative for color change  and pallor.  Neurological:  Negative for dizziness, syncope and weakness.  Psychiatric/Behavioral:  Negative for confusion. The patient is not nervous/anxious.   All other systems reviewed and are  negative.    Medications No current facility-administered medications on file prior to encounter.   Current Outpatient Medications on File Prior to Encounter  Medication Sig Dispense Refill   carvedilol (COREG) 3.125 MG tablet Take 3.125 mg by mouth 2 (two) times daily with a meal.      FARXIGA 10 MG TABS tablet Take 10 mg by mouth every morning.     furosemide (LASIX) 20 MG tablet Take 10 mg by mouth daily as needed for edema.     glimepiride (AMARYL) 1 MG tablet Take 2 mg by mouth 2 (two) times daily. Take 2 tablets twice daily     metFORMIN (GLUCOPHAGE) 500 MG tablet Take 1,000 mg by mouth 2 (two) times daily with a meal.     NOVOLOG MIX 70/30 FLEXPEN (70-30) 100 UNIT/ML FlexPen Inject 45-50 Units into the skin See admin instructions. 45 units in the morning, 50 units at bedtime     olmesartan-hydrochlorothiazide (BENICAR HCT) 20-12.5 MG per tablet Take 0.5 tablets by mouth daily.     simvastatin (ZOCOR) 40 MG tablet Take 40 mg by mouth daily. Once daily at bedtime     sitaGLIPtin (JANUVIA) 100 MG tablet Take 100 mg by mouth daily. In the morning     traMADol (ULTRAM) 50 MG tablet Take by mouth every 6 (six) hours as needed.     glucose blood test strip Use 2 (two) times daily. Use as instructed.      Pertinent medications related to GI and procedure were reviewed by me with the patient prior to the procedure   Current Facility-Administered Medications:    0.9 %  sodium chloride infusion, , Intravenous, Continuous, [name], DO, Last Rate: 20 mL/hr at 08/24/22 1601, Restarted at 08/24/22 0934      No Known Allergies Allergies were reviewed by me prior to the procedure  Objective   Body mass index is 35.44 kg/m. Vitals:   08/24/22 0852  BP: (!) 142/90  Pulse: 65  Temp: (!) 96.8 F (36 C)  TempSrc: Temporal  Weight: 108.9 kg  Height: '5\' 9"'$  (1.753 m)     Physical Exam Vitals and nursing note reviewed.  Constitutional:      General: He is not in acute  distress.    Appearance: Normal appearance. He is obese. He is not ill-appearing, toxic-appearing or diaphoretic.  HENT:     Head: Normocephalic and atraumatic.     Nose: Nose normal.     Mouth/Throat:     Mouth: Mucous membranes are moist.     Pharynx: Oropharynx is clear.  Eyes:     General: No scleral icterus.    Extraocular Movements: Extraocular movements intact.  Cardiovascular:     Rate and Rhythm: Normal rate and regular rhythm.     Heart sounds: Normal heart sounds. No murmur heard.    No friction rub. No gallop.     Comments: Ppm/icd in L chest wall Pulmonary:     Effort: Pulmonary effort is normal. No respiratory distress.     Breath sounds: Normal breath sounds. No wheezing, rhonchi or rales.  Abdominal:     General: Bowel sounds are normal. There is no distension.     Palpations: Abdomen is soft.     Tenderness: There is no abdominal tenderness. There is no guarding or rebound.  Musculoskeletal:     Cervical back: Neck supple.     Right lower leg: No edema.     Left lower leg: No edema.  Skin:    General: Skin is warm and dry.     Coloration: Skin is not jaundiced or pale.  Neurological:     General: No focal deficit present.     Mental Status: He is alert and oriented to person, place, and time. Mental status is at baseline.  Psychiatric:        Mood and Affect: Mood normal.        Behavior: Behavior normal.        Thought Content: Thought content normal.        Judgment: Judgment normal.      Assessment:  [name] is a 66 y.o. male  who presents today for Colonoscopy for surveillance-personal history of colon cancer.  Plan:  Colonoscopy with possible intervention today  Colonoscopy with possible biopsy, control of bleeding, polypectomy, and interventions as necessary has been discussed with the patient/patient representative. Informed consent was obtained from the patient/patient representative after explaining the indication, nature, and risks  of the procedure including but not limited to death, bleeding, perforation, missed neoplasm/lesions, cardiorespiratory compromise, and reaction to medications. Opportunity for questions was given and appropriate answers were provided. Patient/patient representative has verbalized understanding is amenable to undergoing the procedure.   [name], DO  [facility]  Portions of the record may have been created with voice recognition software. Occasional wrong-word or 'sound-a-like' substitutions may have occurred due to the inherent limitations of voice recognition software.  Read the chart carefully and recognize, using context, where substitutions may have occurred.

## 2022-08-24 NOTE — Anesthesia Procedure Notes (Signed)
Date/Time: 08/24/2022 9:46 AM  Performed by: Johnna Acosta, CRNAPre-anesthesia Checklist: Patient identified, Emergency Drugs available, Suction available, Patient being monitored and Timeout performed Patient Re-evaluated:Patient Re-evaluated prior to induction Oxygen Delivery Method: Nasal cannula Preoxygenation: Pre-oxygenation with 100% oxygen Induction Type: IV induction

## 2022-08-24 NOTE — Op Note (Signed)
[facility] Gastroenterology Patient Name: [name] [name] Procedure Date: 08/24/2022 9:34 AM MRN: [id number] Account #: [id number] Date of Birth: 08-24-1956 Admit Type: Outpatient Age: 66 Room: [facility] ENDO ROOM 2 Gender: Male Note Status: Finalized Instrument Name: Colonoscope [id number] Procedure:             Colonoscopy Indications:           High risk colon cancer surveillance: Personal history                         of colonic polyps Providers:             [name] DO, DO Medicines:             Monitored Anesthesia Care Complications:         No immediate complications. Estimated blood loss:                         Minimal. Procedure:             Pre-Anesthesia Assessment:                        - Prior to the procedure, a History and Physical was                         performed, and patient medications and allergies were                         reviewed. The patient is competent. The risks and                         benefits of the procedure and the sedation options and                         risks were discussed with the patient. All questions                         were answered and informed consent was obtained.                         Patient identification and proposed procedure were                         verified by the physician, the nurse, the anesthetist                         and the technician in the endoscopy suite. Mental                         Status Examination: alert and oriented. Airway                         Examination: normal oropharyngeal airway and neck                         mobility. Respiratory Examination: clear to                         auscultation. CV Examination: RRR, no murmurs, no S3  or S4. Prophylactic Antibiotics: The patient does not                         require prophylactic antibiotics. Prior                         Anticoagulants: The patient has taken no anticoagulant                          or antiplatelet agents. ASA Grade Assessment: III - A                         patient with severe systemic disease. After reviewing                         the risks and benefits, the patient was deemed in                         satisfactory condition to undergo the procedure. The                         anesthesia plan was to use monitored anesthesia care                         (MAC). Immediately prior to administration of                         medications, the patient was re-assessed for adequacy                         to receive sedatives. The heart rate, respiratory                         rate, oxygen saturations, blood pressure, adequacy of                         pulmonary ventilation, and response to care were                         monitored throughout the procedure. The physical                         status of the patient was re-assessed after the                         procedure.                        After obtaining informed consent, the colonoscope was                         passed under direct vision. Throughout the procedure,                         the patient's blood pressure, pulse, and oxygen                         saturations were monitored continuously. The  Colonoscope was introduced through the anus and                         advanced to the the terminal ileum, with                         identification of the appendiceal orifice and IC                         valve. The colonoscopy was performed without                         difficulty. The patient tolerated the procedure well.                         The quality of the bowel preparation was evaluated                         using the BBPS [facility] Bowel Preparation Scale) with                         scores of: Right Colon = 2 (minor amount of residual                         staining, small fragments of stool and/or opaque                         liquid, but  mucosa seen well), Transverse Colon = 3                         (entire mucosa seen well with no residual staining,                         small fragments of stool or opaque liquid) and Left                         Colon = 2 (minor amount of residual staining, small                         fragments of stool and/or opaque liquid, but mucosa                         seen well). The total BBPS score equals 7. The quality                         of the bowel preparation was good. The terminal ileum,                         ileocecal valve, appendiceal orifice, and rectum were                         photographed. Findings:      The perianal and digital rectal examinations were normal. Pertinent       negatives include normal sphincter tone.      The terminal ileum appeared normal. Estimated blood loss: none.      Non-bleeding internal hemorrhoids were found during retroflexion. The  hemorrhoids were Grade I (internal hemorrhoids that do not prolapse).       Estimated blood loss: none.      Four sessile polyps were found in the descending colon and ascending       colon (3). The polyps were 3 to 5 mm in size. These polyps were removed       with a cold snare. Resection and retrieval were complete. Estimated       blood loss was minimal.      A 1 to 2 mm polyp was found in the ascending colon. The polyp was       sessile. The polyp was removed with a jumbo cold forceps. Resection and       retrieval were complete. Estimated blood loss was minimal.      Retroflexion in the right colon was performed.      The exam was otherwise without abnormality on direct and retroflexion       views.      Liquid stool was found in the ascending colon and in the cecum,       interfering with visualization. Lavage of the area was performed using a       large amount, resulting in clearance with adequate visualization. Impression:            - The examined portion of the ileum was normal.                         - Non-bleeding internal hemorrhoids.                        - Four 3 to 5 mm polyps in the descending colon and in                         the ascending colon, removed with a cold snare.                         Resected and retrieved.                        - One 1 to 2 mm polyp in the ascending colon, removed                         with a jumbo cold forceps. Resected and retrieved.                        - The examination was otherwise normal on direct and                         retroflexion views.                        - Stool in the ascending colon and in the cecum. Recommendation:        - Patient has a contact number available for                         emergencies. The signs and symptoms of potential                         delayed complications were discussed with the patient.  Return to normal activities tomorrow. Written                         discharge instructions were provided to the patient.                        - Discharge patient to home.                        - Resume previous diet.                        - Continue present medications.                        - No ibuprofen, naproxen, or other non-steroidal                         anti-inflammatory drugs for 5 days after polyp removal.                        - Await pathology results.                        - Repeat colonoscopy for surveillance based on                         pathology results.                        - Return to referring physician as previously                         scheduled.                        - The findings and recommendations were discussed with                         the patient. Procedure Code(s):     --- Professional ---                        [phone], Colonoscopy, flexible; with removal of                         tumor(s), polyp(s), or other lesion(s) by snare                         technique                        45380, 24, Colonoscopy, flexible;  with biopsy, single                         or multiple Diagnosis Code(s):     --- Professional ---                        Z86.010, Personal history of colonic polyps                        K64.0, First degree hemorrhoids  D12.4, Benign neoplasm of descending colon                        D12.2, Benign neoplasm of ascending colon CPT copyright 2022 American Medical Association. All rights reserved. The codes documented in this report are preliminary and upon coder review may  be revised to meet current compliance requirements. Attending Participation:      I personally performed the entire procedure. Volney American, DO [name] DO, DO 08/24/2022 10:36:59 AM This report has been signed electronically. Number of Addenda: 0 Note Initiated On: 08/24/2022 9:34 AM Scope Withdrawal Time: 0 hours 26 minutes 40 seconds  Total Procedure Duration: 0 hours 39 minutes 45 seconds  Estimated Blood Loss:  Estimated blood loss was minimal.      [facility]

## 2022-08-24 NOTE — Interval H&P Note (Signed)
History and Physical Interval Note: Preprocedure H&P from 08/24/22  was reviewed and there was no interval change after seeing and examining the patient.  Written consent was obtained from the patient after discussion of risks, benefits, and alternatives. Patient has consented to proceed with Esophagogastroduodenoscopy and Colonoscopy with possible intervention   08/24/2022 9:40 AM  [name] [name]  has presented today for surgery, with the diagnosis of Z85.038  - Personal history of colon cancer.  The various methods of treatment have been discussed with the patient and family. After consideration of risks, benefits and other options for treatment, the patient has consented to  Procedure(s) with comments: COLONOSCOPY WITH PROPOFOL (N/A) - DM as a surgical intervention.  The patient's history has been reviewed, patient examined, no change in status, stable for surgery.  I have reviewed the patient's chart and labs.  Questions were answered to the patient's satisfaction.     [name]

## 2022-08-25 ENCOUNTER — Encounter: Payer: Self-pay | Admitting: Gastroenterology

## 2022-08-25 LAB — SURGICAL PATHOLOGY

## 2022-08-31 DIAGNOSIS — I10 Essential (primary) hypertension: Secondary | ICD-10-CM | POA: Diagnosis not present

## 2022-08-31 DIAGNOSIS — E1122 Type 2 diabetes mellitus with diabetic chronic kidney disease: Secondary | ICD-10-CM | POA: Diagnosis not present

## 2022-08-31 DIAGNOSIS — Z794 Long term (current) use of insulin: Secondary | ICD-10-CM | POA: Diagnosis not present

## 2022-08-31 DIAGNOSIS — N1831 Chronic kidney disease, stage 3a: Secondary | ICD-10-CM | POA: Diagnosis not present

## 2022-10-14 ENCOUNTER — Inpatient Hospital Stay: Payer: PPO | Attending: Internal Medicine

## 2022-10-14 ENCOUNTER — Inpatient Hospital Stay: Payer: PPO | Admitting: Internal Medicine

## 2022-10-14 ENCOUNTER — Encounter: Payer: Self-pay | Admitting: Internal Medicine

## 2022-10-14 DIAGNOSIS — I420 Dilated cardiomyopathy: Secondary | ICD-10-CM | POA: Diagnosis not present

## 2022-10-14 NOTE — Assessment & Plan Note (Deleted)
#   Colon Cancer- stage III- on CALGB 86282; intolerance to celecoxib/placebo discontinued in Oct 2016 secondary to elevated blood sugars.  STABLE.   # CT in Nov 2020- showed no evidence of disease. December 2019 colonoscopy negative. Clinically stable without evidence of recurrence. CEA 1.2 today.  Continue monitoring on a clinical basis; every 12 months. Will inform [facility] re: repeat colonoscopy [? Over due 3 years]  # Diabetes Mellitus- Fasting--170-STABLE;  Continue follow-up with endocrinology.   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 STABLE    Disposition:  #Refer to [facility] clinic GI regarding follow-up colonoscopy/history of colon cancer # Follow up in 12 months-MD; labs cbc, cmp, cea-Dr. B  Dr.Fath/Dr.Hedrick

## 2022-10-14 NOTE — Progress Notes (Deleted)
[place] OFFICE PROGRESS NOTE  Patient Care Team: [place] Pink, MD as PCP - General (Family Medicine)   SUMMARY OF ONCOLOGIC HISTORY:  Oncology History Overview Note  # AUG 2014- COLO CANCER [s/p sigmoid colectomy] STAGE III [pT1a (adeno ca in large tubulovillous adenoma- invades sub-mucosa)pN1(1/4 Ln pos)]; well diff low grade; margins- neg; on CALGB 28413 [FOLFOX x6 treatments- Oct 2014-Dec 2014]- Study drug x 3 years [Oct 2018] placebo Vs.celexocib. Aug 2015- Colo [Dr.Elliot] Tubular adenoma x 2 ; CT march 2017- NED; CT OCT 2017- NED; colo- oct 2017 [repeat in 2019]  # LEFT KIDNEY cysts/ exophytic lesions <cm- STABLE; cont f/u on CT scan.   # Hx defib; DM  # OCT 2018- MMR-STABLE.   DIAGNOSIS: COLON CANCER  STAGE:   III      ;GOALS: cure  CURRENT/MOST RECENT THERAPY: surveillance    Cancer of sigmoid colon ([place])    INTERVAL HISTORY:  A very pleasant 67 -year-old male patient with above history of colon cancer stage III on protocol CALGB 24401- is here for follow-up.  Complains of mild right-sided shoulder pain.  Not any worse.  States that he has gained weight during the holidays. No abdominal pain no nausea no vomiting.  No blood in stools or black or stools.  Review of Systems  Constitutional:  Positive for malaise/fatigue. Negative for chills, diaphoresis, fever and weight loss.  HENT:  Negative for nosebleeds and sore throat.   Eyes:  Negative for double vision.  Respiratory:  Negative for cough, hemoptysis, sputum production, shortness of breath and wheezing.   Cardiovascular:  Negative for chest pain, palpitations, orthopnea and leg swelling.  Gastrointestinal:  Negative for abdominal pain, blood in stool, constipation, diarrhea, heartburn, melena, nausea and vomiting.  Genitourinary:  Negative for dysuria, frequency and urgency.  Musculoskeletal:  Negative for back pain and joint pain.  Skin: Negative.  Negative for itching and rash.   Neurological:  Positive for dizziness and tingling. Negative for focal weakness, weakness and headaches.  Endo/Heme/Allergies:  Does not bruise/bleed easily.  Psychiatric/Behavioral:  Negative for depression. The patient is not nervous/anxious and does not have insomnia.     PAST MEDICAL HISTORY :  Past Medical History:  Diagnosis Date  . 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  . Cardiac arrhythmia 06/22/2013  . Cardiomyopathy ([place]) 06/22/2013  . CHF (congestive heart failure) ([place])   . Colon cancer [facility]) 2014   Partial Colon Resection.  . Diabetes mellitus without complication ([place])    Patient takes Metformin  . Hyperlipidemia   . Hypertension   . Neck pain on right side 07/16/2015    PAST SURGICAL HISTORY :   Past Surgical History:  Procedure Laterality Date  . Biventricular implantable cardioverter-defbrillator Left 04/27/2014   Medtronic biventricular ICD model Consulta CRTD [place], serial [id number]. The right atrial lead is Medtronic [id number], serial # [id number].  The right ventrcular lead is Medtronic [id number], serial # [id number] and the LV lead is [id number] Medtronic serial # [id number]  . BLADDER SURGERY N/A 06/22/2013   "bladder stretched"  . CARDIAC CATHETERIZATION    . CHOLECYSTECTOMY  01/22/2014   Dr. Tamala Julian  . COLECTOMY Left 05/25/2013   sigmoid colectomy  . COLONOSCOPY WITH PROPOFOL N/A 08/03/2016   Procedure: COLONOSCOPY WITH PROPOFOL;  Surgeon: Manya Silvas, MD;  Location: [facility] ENDOSCOPY;  Service: Endoscopy;  Laterality: N/A;  . COLONOSCOPY WITH PROPOFOL N/A 09/21/2018   Procedure: COLONOSCOPY WITH PROPOFOL;  Surgeon: Vira Agar,  Gavin Pound, MD;  Location: [facility] ENDOSCOPY;  Service: Endoscopy;  Laterality: N/A;  . COLONOSCOPY WITH PROPOFOL N/A 12/25/2021   Procedure: COLONOSCOPY WITH PROPOFOL;  Surgeon: [name], DO;  Location: [facility] ENDOSCOPY;  Service: Gastroenterology;  Laterality: N/A;   IDDM  . COLONOSCOPY WITH PROPOFOL N/A 08/24/2022   Procedure: COLONOSCOPY WITH PROPOFOL;  Surgeon: [name], DO;  Location[facility] END[facility]COPY;  Service: Gastroenterology;  Laterality: N/A;  DM  . ICD GENERATOR CHANGEOUT N/A 11/22/2020   Procedure: ICD GENERATOR CHANGEOUT;  Surgeon: Marzetta Board, MD;  Location: [place] CV LAB;  Service: Cardiovascular;  Laterality: N/A;  . INSERTION CENTRAL VENOUS ACCESS DEVICE W/ SUBCUTANEOUS PORT  2014   Dr. Tamala Julian  . ROTATOR CUFF REPAIR Left 08/2003    FAMILY HISTORY :  Mom- breast/colon; mat- aunt- breast. No bladder /stomach cancer.  Family History  Problem Relation Age of Onset  . Cancer Mother   . Diabetes Father   . Stroke Father   . Sleep apnea Sister   . Cancer Maternal Aunt     SOCIAL HISTORY:   Social History   Tobacco Use  . Smoking status: Never  . Smokeless tobacco: Never  Vaping Use  . Vaping Use: Never used  Substance Use Topics  . Alcohol use: No  . Drug use: No    ALLERGIES:  has No Known Allergies.  MEDICATIONS:  Current Outpatient Medications  Medication Sig Dispense Refill  . carvedilol (COREG) 3.125 MG tablet Take 3.125 mg by mouth 2 (two) times daily with a meal.     . FARXIGA 10 MG TABS tablet Take 10 mg by mouth every morning.    . furosemide (LASIX) 20 MG tablet Take 10 mg by mouth daily as needed for edema.    Marland Kitchen glimepiride (AMARYL) 1 MG tablet Take 2 mg by mouth 2 (two) times daily. Take 2 tablets twice daily    . glucose blood test strip Use 2 (two) times daily. Use as instructed.    . metFORMIN (GLUCOPHAGE) 500 MG tablet Take 1,000 mg by mouth 2 (two) times daily with a meal.    . NOVOLOG MIX 70/30 FLEXPEN (70-30) 100 UNIT/ML FlexPen Inject 45-50 Units into the skin See admin instructions. 45 units in the morning, 50 units at bedtime    . olmesartan-hydrochlorothiazide (BENICAR HCT) 20-12.5 MG per tablet Take 0.5 tablets by mouth daily.    . simvastatin (ZOCOR) 40 MG tablet Take 40 mg by  mouth daily. Once daily at bedtime    . sitaGLIPtin (JANUVIA) 100 MG tablet Take 100 mg by mouth daily. In the morning    . traMADol (ULTRAM) 50 MG tablet Take by mouth every 6 (six) hours as needed.     No current facility-administered medications for this visit.    PHYSICAL EXAMINATION: ECOG PERFORMANCE STATUS: 0 - Asymptomatic  There were no vitals taken for this visit.  There were no vitals filed for this visit.   Physical Exam HENT:     Head: Normocephalic and atraumatic.     Mouth/Throat:     Pharynx: No oropharyngeal exudate.  Eyes:     Pupils: Pupils are equal, round, and reactive to light.  Cardiovascular:     Rate and Rhythm: Normal rate and regular rhythm.  Pulmonary:     Effort: No respiratory distress.     Breath sounds: No wheezing.  Abdominal:     General: Bowel sounds are normal. There is no distension.     Palpations: Abdomen  is soft. There is no mass.     Tenderness: There is no abdominal tenderness. There is no guarding or rebound.  Musculoskeletal:        General: No tenderness. Normal range of motion.     Cervical back: Normal range of motion and neck supple.  Skin:    General: Skin is warm.  Neurological:     Mental Status: He is alert and oriented to person, place, and time.  Psychiatric:        Mood and Affect: Affect normal.    LABORATORY DATA:  I have reviewed the data as listed    Component Value Date/Time   NA 134 (L) 10/14/2021 0846   NA 132 (L) 01/07/2015 1052   K 4.1 10/14/2021 0846   K 4.3 01/07/2015 1052   CL 107 10/14/2021 0846   CL 101 01/07/2015 1052   CO2 21 (L) 10/14/2021 0846   CO2 23 01/07/2015 1052   GLUCOSE 170 (H) 10/14/2021 0846   GLUCOSE 404 (H) 01/07/2015 1052   BUN 11 10/14/2021 0846   BUN 14 01/07/2015 1052   CREATININE 0.97 10/14/2021 0846   CREATININE 0.99 01/07/2015 1052   CALCIUM 8.8 (L) 10/14/2021 0846   CALCIUM 9.2 01/07/2015 1052   PROT 7.7 10/14/2021 0846   PROT 7.7 01/07/2015 1052   ALBUMIN 4.2  10/14/2021 0846   ALBUMIN 4.3 01/07/2015 1052   AST 34 10/14/2021 0846   AST 42 (H) 01/07/2015 1052   ALT 33 10/14/2021 0846   ALT 51 01/07/2015 1052   ALKPHOS 45 10/14/2021 0846   ALKPHOS 64 01/07/2015 1052   BILITOT 0.7 10/14/2021 0846   BILITOT 1.0 01/07/2015 1052   GFRNONAA >60 10/14/2021 0846   GFRNONAA >60 01/07/2015 1052   GFRAA >60 03/19/2020 0931   GFRAA >60 01/07/2015 1052    No results found for: "SPEP", "UPEP"  Lab Results  Component Value Date   WBC 9.8 10/14/2021   NEUTROABS 5.8 10/14/2021   HGB 15.6 10/14/2021   HCT 48.0 10/14/2021   MCV 92.1 10/14/2021   PLT 215 10/14/2021      Chemistry      Component Value Date/Time   NA 134 (L) 10/14/2021 0846   NA 132 (L) 01/07/2015 1052   K 4.1 10/14/2021 0846   K 4.3 01/07/2015 1052   CL 107 10/14/2021 0846   CL 101 01/07/2015 1052   CO2 21 (L) 10/14/2021 0846   CO2 23 01/07/2015 1052   BUN 11 10/14/2021 0846   BUN 14 01/07/2015 1052   CREATININE 0.97 10/14/2021 0846   CREATININE 0.99 01/07/2015 1052      Component Value Date/Time   CALCIUM 8.8 (L) 10/14/2021 0846   CALCIUM 9.2 01/07/2015 1052   ALKPHOS 45 10/14/2021 0846   ALKPHOS 64 01/07/2015 1052   AST 34 10/14/2021 0846   AST 42 (H) 01/07/2015 1052   ALT 33 10/14/2021 0846   ALT 51 01/07/2015 1052   BILITOT 0.7 10/14/2021 0846   BILITOT 1.0 01/07/2015 1052         ASSESSMENT & PLAN:   No problem-specific Assessment & Plan notes found for this encounter.  # 25 minutes face-to-face with the patient discussing the above plan of care; more than 50% of time spent on prognosis/ natural history; counseling and coordination.    Cammie Sickle, MD 10/14/2022 10:26 AM

## 2022-10-28 DIAGNOSIS — Z95810 Presence of automatic (implantable) cardiac defibrillator: Secondary | ICD-10-CM | POA: Diagnosis not present

## 2022-10-28 DIAGNOSIS — Z23 Encounter for immunization: Secondary | ICD-10-CM | POA: Diagnosis not present

## 2022-10-28 DIAGNOSIS — I10 Essential (primary) hypertension: Secondary | ICD-10-CM | POA: Diagnosis not present

## 2022-10-28 DIAGNOSIS — E782 Mixed hyperlipidemia: Secondary | ICD-10-CM | POA: Diagnosis not present

## 2022-10-28 DIAGNOSIS — I420 Dilated cardiomyopathy: Secondary | ICD-10-CM | POA: Diagnosis not present

## 2022-10-28 DIAGNOSIS — Z794 Long term (current) use of insulin: Secondary | ICD-10-CM | POA: Diagnosis not present

## 2022-10-28 DIAGNOSIS — I5022 Chronic systolic (congestive) heart failure: Secondary | ICD-10-CM | POA: Diagnosis not present

## 2022-10-28 DIAGNOSIS — E1165 Type 2 diabetes mellitus with hyperglycemia: Secondary | ICD-10-CM | POA: Diagnosis not present

## 2022-12-03 DIAGNOSIS — R399 Unspecified symptoms and signs involving the genitourinary system: Secondary | ICD-10-CM | POA: Diagnosis not present

## 2022-12-14 DIAGNOSIS — I5022 Chronic systolic (congestive) heart failure: Secondary | ICD-10-CM | POA: Diagnosis not present

## 2022-12-14 DIAGNOSIS — R102 Pelvic and perineal pain: Secondary | ICD-10-CM | POA: Diagnosis not present

## 2022-12-14 DIAGNOSIS — Z87442 Personal history of urinary calculi: Secondary | ICD-10-CM | POA: Diagnosis not present

## 2022-12-14 DIAGNOSIS — Z8739 Personal history of other diseases of the musculoskeletal system and connective tissue: Secondary | ICD-10-CM | POA: Diagnosis not present

## 2022-12-14 DIAGNOSIS — E1165 Type 2 diabetes mellitus with hyperglycemia: Secondary | ICD-10-CM | POA: Diagnosis not present

## 2022-12-14 DIAGNOSIS — Z794 Long term (current) use of insulin: Secondary | ICD-10-CM | POA: Diagnosis not present

## 2022-12-24 DIAGNOSIS — Z794 Long term (current) use of insulin: Secondary | ICD-10-CM | POA: Diagnosis not present

## 2022-12-24 DIAGNOSIS — E1122 Type 2 diabetes mellitus with diabetic chronic kidney disease: Secondary | ICD-10-CM | POA: Diagnosis not present

## 2022-12-24 DIAGNOSIS — N1831 Chronic kidney disease, stage 3a: Secondary | ICD-10-CM | POA: Diagnosis not present

## 2022-12-29 DIAGNOSIS — E113393 Type 2 diabetes mellitus with moderate nonproliferative diabetic retinopathy without macular edema, bilateral: Secondary | ICD-10-CM | POA: Diagnosis not present

## 2022-12-31 DIAGNOSIS — R1013 Epigastric pain: Secondary | ICD-10-CM | POA: Diagnosis not present

## 2022-12-31 DIAGNOSIS — I10 Essential (primary) hypertension: Secondary | ICD-10-CM | POA: Diagnosis not present

## 2022-12-31 DIAGNOSIS — N1831 Chronic kidney disease, stage 3a: Secondary | ICD-10-CM | POA: Diagnosis not present

## 2022-12-31 DIAGNOSIS — Z794 Long term (current) use of insulin: Secondary | ICD-10-CM | POA: Diagnosis not present

## 2022-12-31 DIAGNOSIS — E1122 Type 2 diabetes mellitus with diabetic chronic kidney disease: Secondary | ICD-10-CM | POA: Diagnosis not present

## 2023-01-11 DIAGNOSIS — E782 Mixed hyperlipidemia: Secondary | ICD-10-CM | POA: Diagnosis not present

## 2023-01-11 DIAGNOSIS — Z794 Long term (current) use of insulin: Secondary | ICD-10-CM | POA: Diagnosis not present

## 2023-01-11 DIAGNOSIS — I420 Dilated cardiomyopathy: Secondary | ICD-10-CM | POA: Diagnosis not present

## 2023-01-11 DIAGNOSIS — R1031 Right lower quadrant pain: Secondary | ICD-10-CM | POA: Diagnosis not present

## 2023-01-11 DIAGNOSIS — I5022 Chronic systolic (congestive) heart failure: Secondary | ICD-10-CM | POA: Diagnosis not present

## 2023-01-11 DIAGNOSIS — I10 Essential (primary) hypertension: Secondary | ICD-10-CM | POA: Diagnosis not present

## 2023-01-11 DIAGNOSIS — E1165 Type 2 diabetes mellitus with hyperglycemia: Secondary | ICD-10-CM | POA: Diagnosis not present

## 2023-01-11 DIAGNOSIS — Z95810 Presence of automatic (implantable) cardiac defibrillator: Secondary | ICD-10-CM | POA: Diagnosis not present

## 2023-01-12 ENCOUNTER — Other Ambulatory Visit: Payer: Self-pay | Admitting: Gastroenterology

## 2023-01-12 ENCOUNTER — Ambulatory Visit
Admission: RE | Admit: 2023-01-12 | Discharge: 2023-01-12 | Disposition: A | Discharge status: Home / Self Care | Payer: PPO | Source: Ambulatory Visit | Attending: Gastroenterology | Admitting: Gastroenterology

## 2023-01-12 DIAGNOSIS — R1031 Right lower quadrant pain: Secondary | ICD-10-CM | POA: Insufficient documentation

## 2023-01-12 DIAGNOSIS — N2889 Other specified disorders of kidney and ureter: Secondary | ICD-10-CM | POA: Diagnosis not present

## 2023-01-12 DIAGNOSIS — Z85038 Personal history of other malignant neoplasm of large intestine: Secondary | ICD-10-CM | POA: Diagnosis not present

## 2023-01-12 DIAGNOSIS — I420 Dilated cardiomyopathy: Secondary | ICD-10-CM | POA: Diagnosis not present

## 2023-01-12 DIAGNOSIS — K5909 Other constipation: Secondary | ICD-10-CM | POA: Diagnosis not present

## 2023-01-12 MED ORDER — IOHEXOL 300 MG/ML  SOLN
100.0000 mL | Freq: Once | INTRAMUSCULAR | Status: AC | PRN
  Administered 2023-01-12: 100 mL via INTRAVENOUS

## 2023-01-21 ENCOUNTER — Other Ambulatory Visit: Payer: Self-pay | Admitting: *Deleted

## 2023-01-21 DIAGNOSIS — I5030 Unspecified diastolic (congestive) heart failure: Secondary | ICD-10-CM | POA: Diagnosis not present

## 2023-01-21 DIAGNOSIS — T827XXA Infection and inflammatory reaction due to other cardiac and vascular devices, implants and grafts, initial encounter: Secondary | ICD-10-CM | POA: Diagnosis not present

## 2023-01-21 DIAGNOSIS — Z538 Procedure and treatment not carried out for other reasons: Secondary | ICD-10-CM | POA: Diagnosis not present

## 2023-01-21 DIAGNOSIS — Z85038 Personal history of other malignant neoplasm of large intestine: Secondary | ICD-10-CM | POA: Diagnosis not present

## 2023-01-21 DIAGNOSIS — I2510 Atherosclerotic heart disease of native coronary artery without angina pectoris: Secondary | ICD-10-CM | POA: Diagnosis not present

## 2023-01-21 DIAGNOSIS — Z4502 Encounter for adjustment and management of automatic implantable cardiac defibrillator: Secondary | ICD-10-CM | POA: Diagnosis not present

## 2023-01-21 DIAGNOSIS — I429 Cardiomyopathy, unspecified: Secondary | ICD-10-CM | POA: Diagnosis not present

## 2023-01-21 DIAGNOSIS — E119 Type 2 diabetes mellitus without complications: Secondary | ICD-10-CM | POA: Diagnosis not present

## 2023-01-21 DIAGNOSIS — I517 Cardiomegaly: Secondary | ICD-10-CM | POA: Diagnosis not present

## 2023-01-21 DIAGNOSIS — Z794 Long term (current) use of insulin: Secondary | ICD-10-CM | POA: Diagnosis not present

## 2023-01-21 DIAGNOSIS — Z45018 Encounter for adjustment and management of other part of cardiac pacemaker: Secondary | ICD-10-CM | POA: Diagnosis not present

## 2023-01-21 DIAGNOSIS — I428 Other cardiomyopathies: Secondary | ICD-10-CM | POA: Diagnosis not present

## 2023-01-21 DIAGNOSIS — E1165 Type 2 diabetes mellitus with hyperglycemia: Secondary | ICD-10-CM | POA: Diagnosis not present

## 2023-01-21 DIAGNOSIS — I5022 Chronic systolic (congestive) heart failure: Secondary | ICD-10-CM | POA: Diagnosis not present

## 2023-01-21 DIAGNOSIS — I110 Hypertensive heart disease with heart failure: Secondary | ICD-10-CM | POA: Diagnosis not present

## 2023-01-21 DIAGNOSIS — Z9221 Personal history of antineoplastic chemotherapy: Secondary | ICD-10-CM | POA: Diagnosis not present

## 2023-01-21 DIAGNOSIS — Z7984 Long term (current) use of oral hypoglycemic drugs: Secondary | ICD-10-CM | POA: Diagnosis not present

## 2023-01-21 DIAGNOSIS — E782 Mixed hyperlipidemia: Secondary | ICD-10-CM | POA: Diagnosis not present

## 2023-01-21 DIAGNOSIS — Z8719 Personal history of other diseases of the digestive system: Secondary | ICD-10-CM | POA: Diagnosis not present

## 2023-01-21 DIAGNOSIS — C187 Malignant neoplasm of sigmoid colon: Secondary | ICD-10-CM

## 2023-01-25 ENCOUNTER — Inpatient Hospital Stay: Payer: PPO

## 2023-01-25 ENCOUNTER — Inpatient Hospital Stay: Payer: PPO | Admitting: Internal Medicine

## 2023-01-28 DIAGNOSIS — E119 Type 2 diabetes mellitus without complications: Secondary | ICD-10-CM | POA: Diagnosis not present

## 2023-01-28 DIAGNOSIS — Z794 Long term (current) use of insulin: Secondary | ICD-10-CM | POA: Diagnosis not present

## 2023-01-28 DIAGNOSIS — I420 Dilated cardiomyopathy: Secondary | ICD-10-CM | POA: Diagnosis not present

## 2023-01-28 DIAGNOSIS — Z9221 Personal history of antineoplastic chemotherapy: Secondary | ICD-10-CM | POA: Diagnosis not present

## 2023-01-28 DIAGNOSIS — Z85038 Personal history of other malignant neoplasm of large intestine: Secondary | ICD-10-CM | POA: Diagnosis not present

## 2023-01-28 DIAGNOSIS — I509 Heart failure, unspecified: Secondary | ICD-10-CM | POA: Diagnosis not present

## 2023-01-28 DIAGNOSIS — Z95810 Presence of automatic (implantable) cardiac defibrillator: Secondary | ICD-10-CM | POA: Diagnosis not present

## 2023-01-28 DIAGNOSIS — I455 Other specified heart block: Secondary | ICD-10-CM | POA: Diagnosis not present

## 2023-01-28 DIAGNOSIS — I429 Cardiomyopathy, unspecified: Secondary | ICD-10-CM | POA: Diagnosis not present

## 2023-01-28 DIAGNOSIS — E782 Mixed hyperlipidemia: Secondary | ICD-10-CM | POA: Diagnosis not present

## 2023-01-28 DIAGNOSIS — J9811 Atelectasis: Secondary | ICD-10-CM | POA: Diagnosis not present

## 2023-01-28 DIAGNOSIS — B958 Unspecified staphylococcus as the cause of diseases classified elsewhere: Secondary | ICD-10-CM | POA: Diagnosis not present

## 2023-01-28 DIAGNOSIS — I428 Other cardiomyopathies: Secondary | ICD-10-CM | POA: Diagnosis not present

## 2023-01-28 DIAGNOSIS — Z7984 Long term (current) use of oral hypoglycemic drugs: Secondary | ICD-10-CM | POA: Diagnosis not present

## 2023-01-28 DIAGNOSIS — Z4682 Encounter for fitting and adjustment of non-vascular catheter: Secondary | ICD-10-CM | POA: Diagnosis not present

## 2023-01-28 DIAGNOSIS — E1165 Type 2 diabetes mellitus with hyperglycemia: Secondary | ICD-10-CM | POA: Diagnosis not present

## 2023-01-28 DIAGNOSIS — Z45018 Encounter for adjustment and management of other part of cardiac pacemaker: Secondary | ICD-10-CM | POA: Diagnosis not present

## 2023-01-28 DIAGNOSIS — T827XXA Infection and inflammatory reaction due to other cardiac and vascular devices, implants and grafts, initial encounter: Secondary | ICD-10-CM | POA: Diagnosis not present

## 2023-01-28 DIAGNOSIS — I447 Left bundle-branch block, unspecified: Secondary | ICD-10-CM | POA: Diagnosis not present

## 2023-01-28 DIAGNOSIS — I110 Hypertensive heart disease with heart failure: Secondary | ICD-10-CM | POA: Diagnosis not present

## 2023-01-28 DIAGNOSIS — Z6836 Body mass index (BMI) 36.0-36.9, adult: Secondary | ICD-10-CM | POA: Diagnosis not present

## 2023-01-28 DIAGNOSIS — I5022 Chronic systolic (congestive) heart failure: Secondary | ICD-10-CM | POA: Diagnosis not present

## 2023-01-28 DIAGNOSIS — Z79899 Other long term (current) drug therapy: Secondary | ICD-10-CM | POA: Diagnosis not present

## 2023-01-28 DIAGNOSIS — Z452 Encounter for adjustment and management of vascular access device: Secondary | ICD-10-CM | POA: Diagnosis not present

## 2023-01-28 DIAGNOSIS — T827XXD Infection and inflammatory reaction due to other cardiac and vascular devices, implants and grafts, subsequent encounter: Secondary | ICD-10-CM | POA: Diagnosis not present

## 2023-02-08 ENCOUNTER — Encounter: Payer: Self-pay | Admitting: Internal Medicine

## 2023-02-08 ENCOUNTER — Inpatient Hospital Stay: Payer: PPO | Attending: Internal Medicine

## 2023-02-08 ENCOUNTER — Inpatient Hospital Stay (HOSPITAL_BASED_OUTPATIENT_CLINIC_OR_DEPARTMENT_OTHER): Payer: PPO | Admitting: Internal Medicine

## 2023-02-08 VITALS — BP 102/81 | HR 85 | Temp 97.3°F | Ht 69.0 in | Wt 243.2 lb

## 2023-02-08 DIAGNOSIS — Z9049 Acquired absence of other specified parts of digestive tract: Secondary | ICD-10-CM | POA: Insufficient documentation

## 2023-02-08 DIAGNOSIS — Z95810 Presence of automatic (implantable) cardiac defibrillator: Secondary | ICD-10-CM | POA: Insufficient documentation

## 2023-02-08 DIAGNOSIS — Z823 Family history of stroke: Secondary | ICD-10-CM | POA: Diagnosis not present

## 2023-02-08 DIAGNOSIS — Z794 Long term (current) use of insulin: Secondary | ICD-10-CM | POA: Diagnosis not present

## 2023-02-08 DIAGNOSIS — C187 Malignant neoplasm of sigmoid colon: Secondary | ICD-10-CM

## 2023-02-08 DIAGNOSIS — Z85038 Personal history of other malignant neoplasm of large intestine: Secondary | ICD-10-CM | POA: Insufficient documentation

## 2023-02-08 DIAGNOSIS — Z79899 Other long term (current) drug therapy: Secondary | ICD-10-CM | POA: Insufficient documentation

## 2023-02-08 DIAGNOSIS — N2889 Other specified disorders of kidney and ureter: Secondary | ICD-10-CM | POA: Insufficient documentation

## 2023-02-08 DIAGNOSIS — I2510 Atherosclerotic heart disease of native coronary artery without angina pectoris: Secondary | ICD-10-CM | POA: Diagnosis not present

## 2023-02-08 DIAGNOSIS — Z836 Family history of other diseases of the respiratory system: Secondary | ICD-10-CM | POA: Insufficient documentation

## 2023-02-08 DIAGNOSIS — E1142 Type 2 diabetes mellitus with diabetic polyneuropathy: Secondary | ICD-10-CM | POA: Diagnosis not present

## 2023-02-08 DIAGNOSIS — Z833 Family history of diabetes mellitus: Secondary | ICD-10-CM | POA: Diagnosis not present

## 2023-02-08 DIAGNOSIS — Z08 Encounter for follow-up examination after completed treatment for malignant neoplasm: Secondary | ICD-10-CM | POA: Insufficient documentation

## 2023-02-08 DIAGNOSIS — Z7984 Long term (current) use of oral hypoglycemic drugs: Secondary | ICD-10-CM | POA: Insufficient documentation

## 2023-02-08 LAB — CMP (CANCER CENTER ONLY)
ALT: 23 U/L (ref 0–44)
AST: 23 U/L (ref 15–41)
Albumin: 3.7 g/dL (ref 3.5–5.0)
Alkaline Phosphatase: 72 U/L (ref 38–126)
Anion gap: 8 (ref 5–15)
BUN: 12 mg/dL (ref 8–23)
CO2: 25 mmol/L (ref 22–32)
Calcium: 9.3 mg/dL (ref 8.9–10.3)
Chloride: 104 mmol/L (ref 98–111)
Creatinine: 1.24 mg/dL (ref 0.61–1.24)
GFR, Estimated: >60 mL/min (ref >60)
Glucose, Bld: 120 mg/dL — ABNORMAL HIGH (ref 70–99)
Potassium: 4.2 mmol/L (ref 3.5–5.1)
Sodium: 137 mmol/L (ref 135–145)
Total Bilirubin: 0.8 mg/dL (ref 0.3–1.2)
Total Protein: 7.3 g/dL (ref 6.5–8.1)

## 2023-02-08 LAB — CBC WITH DIFFERENTIAL (CANCER CENTER ONLY)
Abs Immature Granulocytes: 0.07 10*3/uL (ref 0.00–0.07)
Basophils Absolute: 0.1 10*3/uL (ref 0.0–0.1)
Basophils Relative: 1 %
Eosinophils Absolute: 0.4 10*3/uL (ref 0.0–0.5)
Eosinophils Relative: 3 %
HCT: 41.7 % (ref 39.0–52.0)
Hemoglobin: 13.3 g/dL (ref 13.0–17.0)
Immature Granulocytes: 1 %
Lymphocytes Relative: 19 %
Lymphs Abs: 2.5 10*3/uL (ref 0.7–4.0)
MCH: 28.9 pg (ref 26.0–34.0)
MCHC: 31.9 g/dL (ref 30.0–36.0)
MCV: 90.5 fL (ref 80.0–100.0)
Monocytes Absolute: 1 10*3/uL (ref 0.1–1.0)
Monocytes Relative: 8 %
Neutro Abs: 9.1 10*3/uL — ABNORMAL HIGH (ref 1.7–7.7)
Neutrophils Relative %: 68 %
Platelet Count: 222 10*3/uL (ref 150–400)
RBC: 4.61 MIL/uL (ref 4.22–5.81)
RDW: 13.9 % (ref 11.5–15.5)
WBC Count: 13.2 10*3/uL — ABNORMAL HIGH (ref 4.0–10.5)
nRBC: 0 % (ref 0.0–0.2)

## 2023-02-08 NOTE — Progress Notes (Signed)
Defibrillator surgery at [facility] since last visit.  CT scans, one at [facility], one at [facility] and an xray at [facility].

## 2023-02-08 NOTE — Progress Notes (Signed)
[facility] OFFICE PROGRESS NOTE  Patient Care Team: Jerl Mina, MD as PCP - General (Family Medicine)   SUMMARY OF ONCOLOGIC HISTORY:  Oncology History Overview Note  # AUG 2014- COLO CANCER [s/p sigmoid colectomy] STAGE III [pT1a (adeno ca in large tubulovillous adenoma- invades sub-mucosa)pN1(1/4 Ln pos)]; well diff low grade; margins- neg; on CALGB 40981 [FOLFOX x6 treatments- Oct 2014-Dec 2014]- Study drug x 3 years [Oct 2018] placebo Vs.celexocib. Aug 2015- Colo [Dr.Elliot] Tubular adenoma x 2 ; CT march 2017- NED; CT OCT 2017- NED; colo- oct 2017 [repeat in 2019]   # LEFT KIDNEY cysts/ exophytic lesions <cm- STABLE; cont f/u on CT scan.    # Hx defib; DM  # OCT 2018- MMR-STABLE.   DIAGNOSIS: COLON CANCER  STAGE:   III      ;GOALS: cure  CURRENT/MOST RECENT THERAPY: surveillance    Cancer of sigmoid colon ([facility])    INTERVAL HISTORY: Patient ambulating-independently. Alone.  A very pleasant 67 -year-old male patient with above history of colon cancer stage III on protocol CALGB 19147- is here for follow-up; and review the results of the CT scan.  CT scan was ordered by GI re: RUQ pain. Currently abdominal pain resolved.   Defibrillator surgery at [facility] since last visit.   CT scans, one at [facility], one at [facility] and an xray at [facility]   No abdominal pain no nausea no vomiting.  No blood in stools or black or stools.  Review of Systems  Constitutional:  Positive for malaise/fatigue. Negative for chills, diaphoresis, fever and weight loss.  HENT:  Negative for nosebleeds and sore throat.   Eyes:  Negative for double vision.  Respiratory:  Negative for cough, hemoptysis, sputum production, shortness of breath and wheezing.   Cardiovascular:  Negative for chest pain, palpitations, orthopnea and leg swelling.  Gastrointestinal:  Negative for abdominal pain, blood in stool, constipation, diarrhea, heartburn, melena, nausea and vomiting.  Genitourinary:  Negative  for dysuria, frequency and urgency.  Musculoskeletal:  Negative for back pain and joint pain.  Skin: Negative.  Negative for itching and rash.  Neurological:  Positive for dizziness and tingling. Negative for focal weakness, weakness and headaches.  Endo/Heme/Allergies:  Does not bruise/bleed easily.  Psychiatric/Behavioral:  Negative for depression. The patient is not nervous/anxious and does not have insomnia.      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   Cardiac arrhythmia 06/22/2013   Cardiomyopathy ([facility]) 06/22/2013   CHF (congestive heart failure) (HCC)    Colon cancer ([facility]) 2014   Partial Colon Resection.   Diabetes mellitus without complication [facility])    Patient takes Metformin   Hyperlipidemia    Hypertension    Neck pain on right side 07/16/2015    PAST SURGICAL HISTORY :   Past Surgical History:  Procedure Laterality Date   Biventricular implantable cardioverter-defbrillator Left 04/27/2014   Medtronic biventricular ICD model Consulta CRTD 224 [place], serial #[id number]. The right atrial lead is Medtronic [id number], serial # [id number].  The right ventrcular lead is Medtronic [id number], serial # [id number] and the LV lead is 4196 Medtronic serial # [id number]   BLADDER SURGERY N/A 06/22/2013   "bladder stretched"   CARDIAC CATHETERIZATION     CHOLECYSTECTOMY  01/22/2014   Dr. Katrinka Blazing   COLECTOMY Left 05/25/2013   sigmoid colectomy   COLONOSCOPY WITH PROPOFOL N/A 08/03/2016   Procedure: COLONOSCOPY WITH  PROPOFOL;  Surgeon: Scot Jun, MD;  Location: [facility] ENDOSCOPY;  Service: Endoscopy;  Laterality: N/A;   COLONOSCOPY WITH PROPOFOL N/A 09/21/2018   Procedure: COLONOSCOPY WITH PROPOFOL;  Surgeon: Scot Jun, MD;  Location: [facility] ENDOSCOPY;  Service: Endoscopy;  Laterality: N/A;   COLONOSCOPY WITH PROPOFOL N/A 12/25/2021   Procedure:  COLONOSCOPY WITH PROPOFOL;  Surgeon: Jaynie Collins, DO;  Location: [facility] ENDOSCOPY;  Service: Gastroenterology;  Laterality: N/A;  IDDM   COLONOSCOPY WITH PROPOFOL N/A 08/24/2022   Procedure: COLONOSCOPY WITH PROPOFOL;  Surgeon: Jaynie Collins, DO;  Location: [facility] ENDOSCOPY;  Service: Gastroenterology;  Laterality: N/A;  DM   ICD GENERATOR CHANGEOUT N/A 11/22/2020   Procedure: ICD GENERATOR CHANGEOUT;  Surgeon: Sharion Settler, MD;  Location: [facility] INVASIVE CV LAB;  Service: Cardiovascular;  Laterality: N/A;   INSERTION CENTRAL VENOUS ACCESS DEVICE W/ SUBCUTANEOUS PORT  2014   Dr. Katrinka Blazing   ROTATOR CUFF REPAIR Left 08/2003    FAMILY HISTORY :  Mom- breast/colon; mat- aunt- breast. No bladder /stomach cancer.  Family History  Problem Relation Age of Onset   Cancer Mother    Diabetes Father    Stroke Father    Sleep apnea Sister    Cancer Maternal Aunt     SOCIAL HISTORY:   Social History   Tobacco Use   Smoking status: Never   Smokeless tobacco: Never  Vaping Use   Vaping Use: Never used  Substance Use Topics   Alcohol use: No   Drug use: No    ALLERGIES:  has No Known Allergies.  MEDICATIONS:  Current Outpatient Medications  Medication Sig Dispense Refill   carvedilol (COREG) 3.125 MG tablet Take 3.125 mg by mouth 2 (two) times daily with a meal.      FARXIGA 10 MG TABS tablet Take 10 mg by mouth every morning.     furosemide (LASIX) 20 MG tablet Take 10 mg by mouth daily as needed for edema.     glimepiride (AMARYL) 1 MG tablet Take 2 mg by mouth 2 (two) times daily. Take 2 tablets twice daily     glucose blood test strip Use 2 (two) times daily. Use as instructed.     Hyoscyamine Sulfate SL 0.125 MG SUBL Place under the tongue.     metFORMIN (GLUCOPHAGE) 500 MG tablet Take 1,000 mg by mouth 2 (two) times daily with a meal.     NOVOLOG MIX 70/30 FLEXPEN (70-30) 100 UNIT/ML FlexPen Inject 45-50 Units into the skin See admin instructions. 45 units in the morning,  50 units at bedtime     olmesartan-hydrochlorothiazide (BENICAR HCT) 20-12.5 MG per tablet Take 0.5 tablets by mouth daily.     simvastatin (ZOCOR) 40 MG tablet Take 40 mg by mouth daily. Once daily at bedtime     sitaGLIPtin (JANUVIA) 100 MG tablet Take 100 mg by mouth daily. In the morning     traMADol (ULTRAM) 50 MG tablet Take by mouth every 6 (six) hours as needed.     No current facility-administered medications for this visit.    PHYSICAL EXAMINATION: ECOG PERFORMANCE STATUS: 0 - Asymptomatic  BP 102/81 (BP Location: Right Arm, Patient Position: Sitting, Cuff Size: Normal)   Pulse 85   Temp (!) 97.3 F (36.3 C) (Tympanic)   Ht 5\' 9"  (1.753 m)   Wt 243 lb 3.2 oz (110.3 kg)   SpO2 98%   BMI 35.91 kg/m   Filed Weights   02/08/23 1033  Weight: 243 lb 3.2 oz (  110.3 kg)    Physical Exam HENT:     Head: Normocephalic and atraumatic.     Mouth/Throat:     Pharynx: No oropharyngeal exudate.  Eyes:     Pupils: Pupils are equal, round, and reactive to light.  Cardiovascular:     Rate and Rhythm: Normal rate and regular rhythm.  Pulmonary:     Effort: No respiratory distress.     Breath sounds: No wheezing.  Abdominal:     General: Bowel sounds are normal. There is no distension.     Palpations: Abdomen is soft. There is no mass.     Tenderness: There is no abdominal tenderness. There is no guarding or rebound.  Musculoskeletal:        General: No tenderness. Normal range of motion.     Cervical back: Normal range of motion and neck supple.  Skin:    General: Skin is warm.  Neurological:     Mental Status: He is alert and oriented to person, place, and time.  Psychiatric:        Mood and Affect: Affect normal.     LABORATORY DATA:  I have reviewed the data as listed    Component Value Date/Time   NA 137 02/08/2023 1034   NA 132 (L) 01/07/2015 1052   K 4.2 02/08/2023 1034   K 4.3 01/07/2015 1052   CL 104 02/08/2023 1034   CL 101 01/07/2015 1052   CO2 25  02/08/2023 1034   CO2 23 01/07/2015 1052   GLUCOSE 120 (H) 02/08/2023 1034   GLUCOSE 404 (H) 01/07/2015 1052   BUN 12 02/08/2023 1034   BUN 14 01/07/2015 1052   CREATININE 1.24 02/08/2023 1034   CREATININE 0.99 01/07/2015 1052   CALCIUM 9.3 02/08/2023 1034   CALCIUM 9.2 01/07/2015 1052   PROT 7.3 02/08/2023 1034   PROT 7.7 01/07/2015 1052   ALBUMIN 3.7 02/08/2023 1034   ALBUMIN 4.3 01/07/2015 1052   AST 23 02/08/2023 1034   ALT 23 02/08/2023 1034   ALT 51 01/07/2015 1052   ALKPHOS 72 02/08/2023 1034   ALKPHOS 64 01/07/2015 1052   BILITOT 0.8 02/08/2023 1034   GFRNONAA >60 02/08/2023 1034   GFRNONAA >60 01/07/2015 1052   GFRAA >60 03/19/2020 0931   GFRAA >60 01/07/2015 1052    No results found for: "SPEP", "UPEP"  Lab Results  Component Value Date   WBC 13.2 (H) 02/08/2023   NEUTROABS 9.1 (H) 02/08/2023   HGB 13.3 02/08/2023   HCT 41.7 02/08/2023   MCV 90.5 02/08/2023   PLT 222 02/08/2023      Chemistry      Component Value Date/Time   NA 137 02/08/2023 1034   NA 132 (L) 01/07/2015 1052   K 4.2 02/08/2023 1034   K 4.3 01/07/2015 1052   CL 104 02/08/2023 1034   CL 101 01/07/2015 1052   CO2 25 02/08/2023 1034   CO2 23 01/07/2015 1052   BUN 12 02/08/2023 1034   BUN 14 01/07/2015 1052   CREATININE 1.24 02/08/2023 1034   CREATININE 0.99 01/07/2015 1052      Component Value Date/Time   CALCIUM 9.3 02/08/2023 1034   CALCIUM 9.2 01/07/2015 1052   ALKPHOS 72 02/08/2023 1034   ALKPHOS 64 01/07/2015 1052   AST 23 02/08/2023 1034   ALT 23 02/08/2023 1034   ALT 51 01/07/2015 1052   BILITOT 0.8 02/08/2023 1034         ASSESSMENT & PLAN:   Cancer of sigmoid  colon [facility]) # Colon Cancer- stage III- on CALGB 16109; intolerance to celecoxib/placebo discontinued in Oct 2016 secondary to elevated blood sugars.  # [facility] CT in APRIL 2024 [abdominal pain]- showed no evidence of disease. Colonoscopy 08/2022-Impression: Four 3 to 5 mm polyps in the descending colon and in   the ascending colon,Stool in the ascending colon and in the cecum; Path w/ TAs - 1 year repeat recommended.   # APRIL 2024- Small enhancing and enlarging lesion exophytic from the LEFT kidney is most consistent with a small renal neoplasm (benign or malignant)-  UROLOGY CONSULTATION. Left Bosniak I benign renal cyst measuring 2.6 cm.  Refer to Urology re: Left kidney mass   # Diabetes Mellitus- labile-  Continue follow-up with endocrinology [Dr.Solum]. stable.   # CAD s/p defibrillator [[facility]]   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 stable.   Disposition:  # Refer to Urology re: Left kidney mass   # Follow up in 12 months-MD; labs cbc, cmp, cea-Dr. B   # 25 minutes face-to-face with the patient discussing the above plan of care; more than 50% of time spent on prognosis/ natural history; counseling and coordination.    Earna Coder, MD 02/08/2023 11:35 AM

## 2023-02-08 NOTE — Assessment & Plan Note (Addendum)
#   Colon Cancer- stage III- on CALGB 16109; intolerance to celecoxib/placebo discontinued in Oct 2016 secondary to elevated blood sugars.  # [facility]GI CT in APRIL 2024 [abdominal pain]- showed no evidence of disease. Colonoscopy 08/2022-Impression: Four 3 to 5 mm polyps in the descending colon and in  the ascending colon,Stool in the ascending colon and in the cecum; Path w/ TAs - 1 year repeat recommended.   # APRIL 2024- Small enhancing and enlarging lesion exophytic from the LEFT kidney is most consistent with a small renal neoplasm (benign or malignant)-  UROLOGY CONSULTATION. Left Bosniak I benign renal cyst measuring 2.6 cm.  Refer to Urology re: Left kidney mass   # Diabetes Mellitus- labile-  Continue follow-up with endocrinology [Dr.Solum]. stable.   # CAD s/p defibrillator [[facility]]   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 stable.   Disposition:  # Refer to Urology re: Left kidney mass   # Follow up in 12 months-MD; labs cbc, cmp, cea-Dr. B

## 2023-02-10 LAB — CEA: CEA: 1.2 ng/mL (ref 0.0–4.7)

## 2023-02-11 DIAGNOSIS — I420 Dilated cardiomyopathy: Secondary | ICD-10-CM | POA: Diagnosis not present

## 2023-02-11 DIAGNOSIS — I5022 Chronic systolic (congestive) heart failure: Secondary | ICD-10-CM | POA: Diagnosis not present

## 2023-02-11 DIAGNOSIS — I10 Essential (primary) hypertension: Secondary | ICD-10-CM | POA: Diagnosis not present

## 2023-02-11 DIAGNOSIS — Z950 Presence of cardiac pacemaker: Secondary | ICD-10-CM | POA: Diagnosis not present

## 2023-02-11 DIAGNOSIS — T827XXS Infection and inflammatory reaction due to other cardiac and vascular devices, implants and grafts, sequela: Secondary | ICD-10-CM | POA: Diagnosis not present

## 2023-02-15 DIAGNOSIS — E1165 Type 2 diabetes mellitus with hyperglycemia: Secondary | ICD-10-CM | POA: Diagnosis not present

## 2023-02-15 DIAGNOSIS — I10 Essential (primary) hypertension: Secondary | ICD-10-CM | POA: Diagnosis not present

## 2023-02-15 DIAGNOSIS — Z794 Long term (current) use of insulin: Secondary | ICD-10-CM | POA: Diagnosis not present

## 2023-03-15 ENCOUNTER — Encounter: Payer: Self-pay | Admitting: Urology

## 2023-03-15 ENCOUNTER — Ambulatory Visit: Payer: PPO | Admitting: Urology

## 2023-03-15 VITALS — BP 117/80 | HR 87 | Ht 69.0 in | Wt 243.0 lb

## 2023-03-15 DIAGNOSIS — N2889 Other specified disorders of kidney and ureter: Secondary | ICD-10-CM

## 2023-03-15 NOTE — Progress Notes (Signed)
I, Maysun L Gibbs,acting as a scribe for Riki Altes, MD.,have documented all relevant documentation on the behalf of Riki Altes, MD,as directed by  Riki Altes, MD while in the presence of Riki Altes, MD.  03/15/2023 9:30 AM   [name] January 20, 1956 [id number]  Referring provider: Earna Coder, MD [address] [place],  [place] [address]  Chief Complaint  Patient presents with   Other    mass    HPI: [name] [name] is a 67 y.o. male referred for evaluation of a left renal mass.  CT scan of abdomen pelvis with contrast was performed 01/12/23 for right lower quadrant abdominal pain. He was incidentally noted to have a 14 mm hyperdense mass rt lower pole that appeared to enhance on delayed imaging. A prior CT in November 2020 showed the mass to be present, but measured 7 mm at that time and was felt to be a proteinaceous cyst on prior imaging. No left flank or abdominal pain. Denies gross hematuria, though 2-3 years ago he was having intermittent blood spotting on underwear. No fever or chills.   PMH: Past Medical History:  Diagnosis Date   AICD (automatic cardioverter/defibrillator) present 04/20/2014   Medtronic biventricular ICD model Viva CRT-D.   Cardiac arrhythmia 06/22/2013   Cardiomyopathy (HCC) 06/22/2013   CHF (congestive heart failure) (HCC)    Colon cancer (HCC) 2014   Partial Colon Resection.   Diabetes mellitus without complication [facility])    Patient takes Metformin   Hyperlipidemia    Hypertension    Neck pain on right side 07/16/2015    Surgical History: Past Surgical History:  Procedure Laterality Date   Biventricular implantable cardioverter-defbrillator Left 04/27/2014   Medtronic biventricular ICD model Consulta CRTD 224 TRK, serial #[id number]. The right atrial lead is Medtronic [id number], serial # [id number].  The right ventrcular lead is Medtronic [id number], serial # [id number] and the LV lead is 4196 Medtronic serial #  [id number]   BLADDER SURGERY N/A 06/22/2013   "bladder stretched"   CARDIAC CATHETERIZATION     CHOLECYSTECTOMY  01/22/2014   Dr. Katrinka Blazing   COLECTOMY Left 05/25/2013   sigmoid colectomy   COLONOSCOPY WITH PROPOFOL N/A 08/03/2016   Procedure: COLONOSCOPY WITH PROPOFOL;  Surgeon: Scot Jun, MD;  Location[facility] ENDOSCOPY;  Service: Endoscopy;  Laterality: N/A;   COLONOSCOPY WITH PROPOFOL N/A 09/21/2018   Procedure: COLONOSCOPY WITH PROPOFOL;  Surgeon: Scot Jun, MD;  Location: [facility] ENDOSCOPY;  Service: Endoscopy;  Laterality: N/A;   COLONOSCOPY WITH PROPOFOL N/A 12/25/2021   Procedure: COLONOSCOPY WITH PROPOFOL;  Surgeon: Jaynie Collins, DO;  Location: [facility] ENDOSCOPY;  Service: Gastroenterology;  Laterality: N/A;  IDDM   COLONOSCOPY WITH PROPOFOL N/A 08/24/2022   Procedure: COLONOSCOPY WITH PROPOFOL;  Surgeon: Jaynie Collins, DO;  Location: [facility] ENDOSCOPY;  Service: Gastroenterology;  Laterality: N/A;  DM   ICD GENERATOR CHANGEOUT N/A 11/22/2020   Procedure: ICD GENERATOR CHANGEOUT;  Surgeon: Sharion Settler, MD;  Location: [facility] INVASIVE CV LAB;  Service: Cardiovascular;  Laterality: N/A;   INSERTION CENTRAL VENOUS ACCESS DEVICE W/ SUBCUTANEOUS PORT  2014   Dr. Katrinka Blazing   ROTATOR CUFF REPAIR Left 08/2003    Home Medications:  Allergies as of 03/15/2023   No Known Allergies      Medication List        Accurate as of March 15, 2023  9:30 AM. If you have any questions, ask your nurse or doctor.  carvedilol 3.125 MG tablet Commonly known as: COREG Take 3.125 mg by mouth 2 (two) times daily with a meal.   Farxiga 10 MG Tabs tablet Generic drug: dapagliflozin propanediol Take 10 mg by mouth every morning.   furosemide 20 MG tablet Commonly known as: LASIX Take 10 mg by mouth daily as needed for edema.   glimepiride 1 MG tablet Commonly known as: AMARYL Take 2 mg by mouth 2 (two) times daily. Take 2 tablets twice daily   glucose blood test  strip Use 2 (two) times daily. Use as instructed.   Hyoscyamine Sulfate SL 0.125 MG Subl Place under the tongue.   metFORMIN 500 MG tablet Commonly known as: GLUCOPHAGE Take 1,000 mg by mouth 2 (two) times daily with a meal.   NovoLOG Mix 70/30 FlexPen (70-30) 100 UNIT/ML FlexPen Generic drug: insulin aspart protamine - aspart Inject 45-50 Units into the skin See admin instructions. 45 units in the morning, 50 units at bedtime   olmesartan-hydrochlorothiazide 20-12.5 MG tablet Commonly known as: BENICAR HCT Take 0.5 tablets by mouth daily.   simvastatin 40 MG tablet Commonly known as: ZOCOR Take 40 mg by mouth daily. Once daily at bedtime   sitaGLIPtin 100 MG tablet Commonly known as: JANUVIA Take 100 mg by mouth daily. In the morning   traMADol 50 MG tablet Commonly known as: ULTRAM Take by mouth every 6 (six) hours as needed.        Allergies: No Known Allergies  Family History: Family History  Problem Relation Age of Onset   Cancer Mother    Diabetes Father    Stroke Father    Sleep apnea Sister    Cancer Maternal Aunt     Social History:  reports that he has never smoked. He has never used smokeless tobacco. He reports that he does not drink alcohol and does not use drugs.   Physical Exam: BP 117/80   Pulse 87   Ht 5\' 9"  (1.753 m)   Wt 243 lb (110.2 kg)   BMI 35.88 kg/m   Constitutional:  Alert and oriented, No acute distress. HEENT: [place] AT Respiratory: Normal respiratory effort, no increased work of breathing. Psychiatric: Normal mood and affect.   Pertinent Imaging: CT abdomen was personally 01/12/23 and 08/10/2019 were personally reviewed and interpreted.    Assessment & Plan:    1. Left renal mass Exophytic 14 mm left renal mass, which has increased in size from prior scan of 2020.  He has not had renal mass protocol imaging. He has a implantable defibrillator and is not a candidate for MRI.  Schedule CT abdomen with and without contrast-  we'll call with results If this is a solid mass, we discussed management options of surveillance, percutaneous ablation by interventional radiology, laparoscopic excision, and renal mass biopsy.  I have reviewed the above documentation for accuracy and completeness, and I agree with the above.   Riki Altes, MD  [facility] [address] [place], [place] [address] [phone]

## 2023-03-15 NOTE — Addendum Note (Signed)
Addended by: Consuella Lose on: 03/15/2023 04:25 PM   Modules accepted: Orders

## 2023-03-24 ENCOUNTER — Ambulatory Visit
Admission: RE | Admit: 2023-03-24 | Discharge: 2023-03-24 | Disposition: A | Discharge status: Home / Self Care | Payer: PPO | Source: Ambulatory Visit | Attending: Urology | Admitting: Urology

## 2023-03-24 DIAGNOSIS — K7689 Other specified diseases of liver: Secondary | ICD-10-CM | POA: Diagnosis not present

## 2023-03-24 DIAGNOSIS — N2889 Other specified disorders of kidney and ureter: Secondary | ICD-10-CM | POA: Diagnosis not present

## 2023-03-24 DIAGNOSIS — N281 Cyst of kidney, acquired: Secondary | ICD-10-CM | POA: Diagnosis not present

## 2023-03-24 LAB — POCT I-STAT CREATININE: Creatinine, Ser: 1.3 mg/dL — ABNORMAL HIGH (ref 0.61–1.24)

## 2023-03-24 MED ORDER — IOHEXOL 300 MG/ML  SOLN
100.0000 mL | Freq: Once | INTRAMUSCULAR | Status: AC | PRN
  Administered 2023-03-24: 100 mL via INTRAVENOUS

## 2023-04-12 DIAGNOSIS — E782 Mixed hyperlipidemia: Secondary | ICD-10-CM | POA: Diagnosis not present

## 2023-04-12 DIAGNOSIS — I10 Essential (primary) hypertension: Secondary | ICD-10-CM | POA: Diagnosis not present

## 2023-04-12 DIAGNOSIS — I420 Dilated cardiomyopathy: Secondary | ICD-10-CM | POA: Diagnosis not present

## 2023-04-12 DIAGNOSIS — I5022 Chronic systolic (congestive) heart failure: Secondary | ICD-10-CM | POA: Diagnosis not present

## 2023-04-12 DIAGNOSIS — Z95810 Presence of automatic (implantable) cardiac defibrillator: Secondary | ICD-10-CM | POA: Diagnosis not present

## 2023-04-12 DIAGNOSIS — T827XXS Infection and inflammatory reaction due to other cardiac and vascular devices, implants and grafts, sequela: Secondary | ICD-10-CM | POA: Diagnosis not present

## 2023-04-12 DIAGNOSIS — T827XXD Infection and inflammatory reaction due to other cardiac and vascular devices, implants and grafts, subsequent encounter: Secondary | ICD-10-CM | POA: Diagnosis not present

## 2023-04-29 DIAGNOSIS — I10 Essential (primary) hypertension: Secondary | ICD-10-CM | POA: Diagnosis not present

## 2023-04-29 DIAGNOSIS — E1165 Type 2 diabetes mellitus with hyperglycemia: Secondary | ICD-10-CM | POA: Diagnosis not present

## 2023-04-29 DIAGNOSIS — Z794 Long term (current) use of insulin: Secondary | ICD-10-CM | POA: Diagnosis not present

## 2023-05-14 DIAGNOSIS — Z87442 Personal history of urinary calculi: Secondary | ICD-10-CM | POA: Diagnosis not present

## 2023-05-14 DIAGNOSIS — Z95810 Presence of automatic (implantable) cardiac defibrillator: Secondary | ICD-10-CM | POA: Diagnosis not present

## 2023-05-14 DIAGNOSIS — I5022 Chronic systolic (congestive) heart failure: Secondary | ICD-10-CM | POA: Diagnosis not present

## 2023-05-14 DIAGNOSIS — R300 Dysuria: Secondary | ICD-10-CM | POA: Diagnosis not present

## 2023-05-14 DIAGNOSIS — Z794 Long term (current) use of insulin: Secondary | ICD-10-CM | POA: Diagnosis not present

## 2023-05-14 DIAGNOSIS — N390 Urinary tract infection, site not specified: Secondary | ICD-10-CM | POA: Diagnosis not present

## 2023-05-14 DIAGNOSIS — I420 Dilated cardiomyopathy: Secondary | ICD-10-CM | POA: Diagnosis not present

## 2023-05-14 DIAGNOSIS — E1165 Type 2 diabetes mellitus with hyperglycemia: Secondary | ICD-10-CM | POA: Diagnosis not present

## 2023-07-13 DIAGNOSIS — I420 Dilated cardiomyopathy: Secondary | ICD-10-CM | POA: Diagnosis not present

## 2023-07-16 DIAGNOSIS — E782 Mixed hyperlipidemia: Secondary | ICD-10-CM | POA: Diagnosis not present

## 2023-07-16 DIAGNOSIS — Z23 Encounter for immunization: Secondary | ICD-10-CM | POA: Diagnosis not present

## 2023-07-16 DIAGNOSIS — T827XXD Infection and inflammatory reaction due to other cardiac and vascular devices, implants and grafts, subsequent encounter: Secondary | ICD-10-CM | POA: Diagnosis not present

## 2023-07-16 DIAGNOSIS — Z95810 Presence of automatic (implantable) cardiac defibrillator: Secondary | ICD-10-CM | POA: Diagnosis not present

## 2023-07-16 DIAGNOSIS — I10 Essential (primary) hypertension: Secondary | ICD-10-CM | POA: Diagnosis not present

## 2023-07-16 DIAGNOSIS — T827XXS Infection and inflammatory reaction due to other cardiac and vascular devices, implants and grafts, sequela: Secondary | ICD-10-CM | POA: Diagnosis not present

## 2023-07-16 DIAGNOSIS — I5022 Chronic systolic (congestive) heart failure: Secondary | ICD-10-CM | POA: Diagnosis not present

## 2023-07-16 DIAGNOSIS — I420 Dilated cardiomyopathy: Secondary | ICD-10-CM | POA: Diagnosis not present

## 2023-08-02 DIAGNOSIS — I10 Essential (primary) hypertension: Secondary | ICD-10-CM | POA: Diagnosis not present

## 2023-08-02 DIAGNOSIS — N1831 Chronic kidney disease, stage 3a: Secondary | ICD-10-CM | POA: Diagnosis not present

## 2023-08-02 DIAGNOSIS — E1122 Type 2 diabetes mellitus with diabetic chronic kidney disease: Secondary | ICD-10-CM | POA: Diagnosis not present

## 2023-08-02 DIAGNOSIS — E1165 Type 2 diabetes mellitus with hyperglycemia: Secondary | ICD-10-CM | POA: Diagnosis not present

## 2023-08-02 DIAGNOSIS — Z794 Long term (current) use of insulin: Secondary | ICD-10-CM | POA: Diagnosis not present

## 2023-10-19 DIAGNOSIS — Z794 Long term (current) use of insulin: Secondary | ICD-10-CM | POA: Diagnosis not present

## 2023-10-19 DIAGNOSIS — E1165 Type 2 diabetes mellitus with hyperglycemia: Secondary | ICD-10-CM | POA: Diagnosis not present

## 2023-10-19 DIAGNOSIS — J101 Influenza due to other identified influenza virus with other respiratory manifestations: Secondary | ICD-10-CM | POA: Diagnosis not present

## 2023-10-19 DIAGNOSIS — R051 Acute cough: Secondary | ICD-10-CM | POA: Diagnosis not present

## 2023-11-09 DIAGNOSIS — I10 Essential (primary) hypertension: Secondary | ICD-10-CM | POA: Diagnosis not present

## 2023-11-09 DIAGNOSIS — E1165 Type 2 diabetes mellitus with hyperglycemia: Secondary | ICD-10-CM | POA: Diagnosis not present

## 2023-11-09 DIAGNOSIS — Z794 Long term (current) use of insulin: Secondary | ICD-10-CM | POA: Diagnosis not present

## 2023-11-11 DIAGNOSIS — E1122 Type 2 diabetes mellitus with diabetic chronic kidney disease: Secondary | ICD-10-CM | POA: Diagnosis not present

## 2023-11-11 DIAGNOSIS — Z794 Long term (current) use of insulin: Secondary | ICD-10-CM | POA: Diagnosis not present

## 2023-11-11 DIAGNOSIS — N1831 Chronic kidney disease, stage 3a: Secondary | ICD-10-CM | POA: Diagnosis not present

## 2023-11-11 DIAGNOSIS — E1165 Type 2 diabetes mellitus with hyperglycemia: Secondary | ICD-10-CM | POA: Diagnosis not present

## 2023-11-11 DIAGNOSIS — I10 Essential (primary) hypertension: Secondary | ICD-10-CM | POA: Diagnosis not present

## 2023-11-16 DIAGNOSIS — I5022 Chronic systolic (congestive) heart failure: Secondary | ICD-10-CM | POA: Diagnosis not present

## 2023-11-16 DIAGNOSIS — Z95810 Presence of automatic (implantable) cardiac defibrillator: Secondary | ICD-10-CM | POA: Diagnosis not present

## 2023-11-16 DIAGNOSIS — I420 Dilated cardiomyopathy: Secondary | ICD-10-CM | POA: Diagnosis not present

## 2023-11-16 DIAGNOSIS — I10 Essential (primary) hypertension: Secondary | ICD-10-CM | POA: Diagnosis not present

## 2023-11-23 DIAGNOSIS — I255 Ischemic cardiomyopathy: Secondary | ICD-10-CM | POA: Diagnosis not present

## 2023-11-23 DIAGNOSIS — I5022 Chronic systolic (congestive) heart failure: Secondary | ICD-10-CM | POA: Diagnosis not present

## 2023-12-14 DIAGNOSIS — I5022 Chronic systolic (congestive) heart failure: Secondary | ICD-10-CM | POA: Diagnosis not present

## 2023-12-14 DIAGNOSIS — I420 Dilated cardiomyopathy: Secondary | ICD-10-CM | POA: Diagnosis not present

## 2023-12-14 DIAGNOSIS — E782 Mixed hyperlipidemia: Secondary | ICD-10-CM | POA: Diagnosis not present

## 2023-12-14 DIAGNOSIS — I10 Essential (primary) hypertension: Secondary | ICD-10-CM | POA: Diagnosis not present

## 2023-12-14 DIAGNOSIS — Z95810 Presence of automatic (implantable) cardiac defibrillator: Secondary | ICD-10-CM | POA: Diagnosis not present

## 2024-01-04 DIAGNOSIS — I5022 Chronic systolic (congestive) heart failure: Secondary | ICD-10-CM | POA: Diagnosis not present

## 2024-01-04 DIAGNOSIS — I420 Dilated cardiomyopathy: Secondary | ICD-10-CM | POA: Diagnosis not present

## 2024-02-08 ENCOUNTER — Inpatient Hospital Stay: Payer: PPO | Attending: Internal Medicine

## 2024-02-08 ENCOUNTER — Inpatient Hospital Stay: Payer: PPO | Admitting: Internal Medicine

## 2024-02-08 ENCOUNTER — Encounter: Payer: Self-pay | Admitting: Internal Medicine

## 2024-02-08 VITALS — BP 123/99 | HR 80 | Temp 96.8°F | Resp 16 | Ht 69.0 in | Wt 250.8 lb

## 2024-02-08 DIAGNOSIS — Z85038 Personal history of other malignant neoplasm of large intestine: Secondary | ICD-10-CM | POA: Diagnosis not present

## 2024-02-08 DIAGNOSIS — K760 Fatty (change of) liver, not elsewhere classified: Secondary | ICD-10-CM | POA: Diagnosis not present

## 2024-02-08 DIAGNOSIS — Z9049 Acquired absence of other specified parts of digestive tract: Secondary | ICD-10-CM | POA: Diagnosis not present

## 2024-02-08 DIAGNOSIS — I2510 Atherosclerotic heart disease of native coronary artery without angina pectoris: Secondary | ICD-10-CM | POA: Insufficient documentation

## 2024-02-08 DIAGNOSIS — E1165 Type 2 diabetes mellitus with hyperglycemia: Secondary | ICD-10-CM | POA: Diagnosis not present

## 2024-02-08 DIAGNOSIS — Z836 Family history of other diseases of the respiratory system: Secondary | ICD-10-CM | POA: Insufficient documentation

## 2024-02-08 DIAGNOSIS — C187 Malignant neoplasm of sigmoid colon: Secondary | ICD-10-CM | POA: Diagnosis not present

## 2024-02-08 DIAGNOSIS — Z08 Encounter for follow-up examination after completed treatment for malignant neoplasm: Secondary | ICD-10-CM | POA: Diagnosis not present

## 2024-02-08 DIAGNOSIS — Z79899 Other long term (current) drug therapy: Secondary | ICD-10-CM | POA: Diagnosis not present

## 2024-02-08 DIAGNOSIS — Z794 Long term (current) use of insulin: Secondary | ICD-10-CM | POA: Diagnosis not present

## 2024-02-08 DIAGNOSIS — Z823 Family history of stroke: Secondary | ICD-10-CM | POA: Insufficient documentation

## 2024-02-08 DIAGNOSIS — Z95810 Presence of automatic (implantable) cardiac defibrillator: Secondary | ICD-10-CM | POA: Diagnosis not present

## 2024-02-08 DIAGNOSIS — E1142 Type 2 diabetes mellitus with diabetic polyneuropathy: Secondary | ICD-10-CM | POA: Diagnosis not present

## 2024-02-08 DIAGNOSIS — N2889 Other specified disorders of kidney and ureter: Secondary | ICD-10-CM | POA: Diagnosis not present

## 2024-02-08 DIAGNOSIS — Z833 Family history of diabetes mellitus: Secondary | ICD-10-CM | POA: Diagnosis not present

## 2024-02-08 DIAGNOSIS — Z7984 Long term (current) use of oral hypoglycemic drugs: Secondary | ICD-10-CM | POA: Insufficient documentation

## 2024-02-08 LAB — CMP (CANCER CENTER ONLY)
ALT: 48 U/L — ABNORMAL HIGH (ref 0–44)
AST: 47 U/L — ABNORMAL HIGH (ref 15–41)
Albumin: 4.1 g/dL (ref 3.5–5.0)
Alkaline Phosphatase: 47 U/L (ref 38–126)
Anion gap: 8 (ref 5–15)
BUN: 20 mg/dL (ref 8–23)
CO2: 24 mmol/L (ref 22–32)
Calcium: 10 mg/dL (ref 8.9–10.3)
Chloride: 105 mmol/L (ref 98–111)
Creatinine: 1.12 mg/dL (ref 0.61–1.24)
GFR, Estimated: >60 mL/min (ref >60)
Glucose, Bld: 76 mg/dL (ref 70–99)
Potassium: 4.4 mmol/L (ref 3.5–5.1)
Sodium: 137 mmol/L (ref 135–145)
Total Bilirubin: 0.8 mg/dL (ref 0.0–1.2)
Total Protein: 7.9 g/dL (ref 6.5–8.1)

## 2024-02-08 LAB — CBC WITH DIFFERENTIAL (CANCER CENTER ONLY)
Abs Immature Granulocytes: 0.05 10*3/uL (ref 0.00–0.07)
Basophils Absolute: 0.1 10*3/uL (ref 0.0–0.1)
Basophils Relative: 1 %
Eosinophils Absolute: 0.6 10*3/uL — ABNORMAL HIGH (ref 0.0–0.5)
Eosinophils Relative: 5 %
HCT: 43 % (ref 39.0–52.0)
Hemoglobin: 14.6 g/dL (ref 13.0–17.0)
Immature Granulocytes: 0 %
Lymphocytes Relative: 33 %
Lymphs Abs: 3.9 10*3/uL (ref 0.7–4.0)
MCH: 30.3 pg (ref 26.0–34.0)
MCHC: 34 g/dL (ref 30.0–36.0)
MCV: 89.2 fL (ref 80.0–100.0)
Monocytes Absolute: 1.4 10*3/uL — ABNORMAL HIGH (ref 0.1–1.0)
Monocytes Relative: 11 %
Neutro Abs: 6 10*3/uL (ref 1.7–7.7)
Neutrophils Relative %: 50 %
Platelet Count: 228 10*3/uL (ref 150–400)
RBC: 4.82 MIL/uL (ref 4.22–5.81)
RDW: 12.6 % (ref 11.5–15.5)
WBC Count: 11.9 10*3/uL — ABNORMAL HIGH (ref 4.0–10.5)
nRBC: 0 % (ref 0.0–0.2)

## 2024-02-08 NOTE — Progress Notes (Signed)
 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 Aug 2015- Colo [Dr.Elliot] Tubular adenoma x 2 ; CT march 2017- NED; CT OCT 2017- NED; colo- oct 2017 [repeat in 2019]   # LEFT KIDNEY cysts/ exophytic lesions <cm- STABLE; cont f/u on CT scan.    # Hx defib; DM  # OCT 2018- MMR-STABLE.   DIAGNOSIS: COLON CANCER  STAGE:   III      ;GOALS: cure  CURRENT/MOST RECENT THERAPY: surveillance    Cancer of sigmoid colon (HCC)    INTERVAL HISTORY: Patient ambulating-independently. With son.   A very pleasant 68 -year-old male patient with above history of colon cancer stage III on protocol CALGB [address]- is here for follow-up.  Since last visit pt states he has had his pacemaker switched from the left to the right side and has had an ECG 02/11/23 and chest xray 02/02/23 done at [facility]. Did not get colonospcoy sec to admission to hospital.    No abdominal pain no nausea no vomiting.  No blood in stools or black or stools.  Review of Systems  Constitutional:  Positive for malaise/fatigue. Negative for chills, diaphoresis, fever and weight loss.  HENT:  Negative for nosebleeds and sore throat.   Eyes:  Negative for double vision.  Respiratory:  Negative for cough, hemoptysis, sputum production, shortness of breath and wheezing.   Cardiovascular:  Negative for chest pain, palpitations, orthopnea and leg swelling.  Gastrointestinal:  Negative for abdominal pain, blood in stool, constipation, diarrhea, heartburn,  melena, nausea and vomiting.  Genitourinary:  Negative for dysuria, frequency and urgency.  Musculoskeletal:  Negative for back pain and joint pain.  Skin: Negative.  Negative for itching and rash.  Neurological:  Positive for dizziness and tingling. Negative for focal weakness, weakness and headaches.  Endo/Heme/Allergies:  Does not bruise/bleed easily.  Psychiatric/Behavioral:  Negative for depression. The patient is not nervous/anxious and does not have insomnia.      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   Cardiac arrhythmia 06/22/2013   Cardiomyopathy (HCC) 06/22/2013   CHF (congestive heart failure) (HCC)    Colon cancer (HCC) 2014   Partial Colon Resection.   Diabetes mellitus without complication [facility])    Patient takes Metformin   Hyperlipidemia    Hypertension    Neck pain on right side 07/16/2015    PAST SURGICAL HISTORY :   Past Surgical History:  Procedure Laterality Date   Biventricular implantable cardioverter-defbrillator Left 04/27/2014   Medtronic biventricular ICD model Consulta CRTD 224 TRK, serial #[id number]. The right atrial lead is Medtronic [id number], serial # [id number].  The right ventrcular lead is Medtronic [id number], serial # [id number] and the LV lead is 4196 Medtronic serial # [id number]   BLADDER SURGERY N/A 06/22/2013   "bladder stretched"   CARDIAC CATHETERIZATION     CHOLECYSTECTOMY  01/22/2014   Dr. Felipe Horton   COLECTOMY Left 05/25/2013   sigmoid colectomy   COLONOSCOPY WITH PROPOFOL  N/A  08/03/2016   Procedure: COLONOSCOPY WITH PROPOFOL ;  Surgeon: Cassie Click, MD;  Location: [facility] ENDOSCOPY;  Service: Endoscopy;  Laterality: N/A;   COLONOSCOPY WITH PROPOFOL  N/A 09/21/2018   Procedure: COLONOSCOPY WITH PROPOFOL ;  Surgeon: Cassie Click, MD;  Location: [facility] ENDOSCOPY;  Service: Endoscopy;  Laterality: N/A;    COLONOSCOPY WITH PROPOFOL  N/A 12/25/2021   Procedure: COLONOSCOPY WITH PROPOFOL ;  Surgeon: Quintin Buckle, DO;  Location: [name] ENDOSCOPY;  Service: Gastroenterology;  Laterality: N/A;  IDDM   COLONOSCOPY WITH PROPOFOL  N/A 08/24/2022   Procedure: COLONOSCOPY WITH PROPOFOL ;  Surgeon: Quintin Buckle, DO;  Location: [name] ENDOSCOPY;  Service: Gastroenterology;  Laterality: N/A;  DM   ICD GENERATOR CHANGEOUT N/A 11/22/2020   Procedure: ICD GENERATOR CHANGEOUT;  Surgeon: Jerrie Morales, MD;  Location: [facility] INVASIVE CV LAB;  Service: Cardiovascular;  Laterality: N/A;   INSERTION CENTRAL VENOUS ACCESS DEVICE W/ SUBCUTANEOUS PORT  2014   Dr. Felipe Horton   ROTATOR CUFF REPAIR Left 08/2003    FAMILY HISTORY :  Mom- breast/colon; mat- aunt- breast. No bladder /stomach cancer.  Family History  Problem Relation Age of Onset   Cancer Mother    Diabetes Father    Stroke Father    Sleep apnea Sister    Cancer Maternal Aunt     SOCIAL HISTORY:   Social History   Tobacco Use   Smoking status: Never   Smokeless tobacco: Never  Vaping Use   Vaping status: Never Used  Substance Use Topics   Alcohol use: No   Drug use: No    ALLERGIES:  has no known allergies.  MEDICATIONS:  Current Outpatient Medications  Medication Sig Dispense Refill   carvedilol (COREG) 3.125 MG tablet Take 3.125 mg by mouth 2 (two) times daily with a meal.      FARXIGA 10 MG TABS tablet Take 10 mg by mouth every morning.     furosemide (LASIX) 20 MG tablet Take 10 mg by mouth daily as needed for edema.     glimepiride (AMARYL) 1 MG tablet Take 2 mg by mouth 2 (two) times daily. Take 2 tablets twice daily     glucose blood test strip Use 2 (two) times daily. Use as instructed.     Hyoscyamine Sulfate SL 0.125 MG SUBL Place under the tongue.     metFORMIN (GLUCOPHAGE) 500 MG tablet Take 1,000 mg by mouth 2 (two) times daily with a meal.     NOVOLOG MIX 70/30 FLEXPEN (70-30) 100 UNIT/ML FlexPen Inject 45-50 Units into  the skin See admin instructions. 45 units in the morning, 50 units at bedtime     olmesartan-hydrochlorothiazide (BENICAR HCT) 20-12.5 MG per tablet Take 0.5 tablets by mouth daily.     simvastatin (ZOCOR) 40 MG tablet Take 40 mg by mouth daily. Once daily at bedtime     sitaGLIPtin (JANUVIA) 100 MG tablet Take 100 mg by mouth daily. In the morning     traMADol (ULTRAM) 50 MG tablet Take by mouth every 6 (six) hours as needed.     No current facility-administered medications for this visit.    PHYSICAL EXAMINATION: ECOG PERFORMANCE STATUS: 0 - Asymptomatic  BP (!) 123/99 (BP Location: Left Arm, Patient Position: Sitting, Cuff Size: Normal)   Pulse 80   Temp (!) 96.8 F (36 C) (Tympanic)   Resp 16   Ht 5\' 9"  (1.753 m)   Wt 250 lb 12.8 oz (113.8 kg)   SpO2 100%   BMI 37.04 kg/m   Filed  Weights   02/08/24 1026  Weight: 250 lb 12.8 oz (113.8 kg)    Physical Exam HENT:     Head: Normocephalic and atraumatic.     Mouth/Throat:     Pharynx: No oropharyngeal exudate.  Eyes:     Pupils: Pupils are equal, round, and reactive to light.  Cardiovascular:     Rate and Rhythm: Normal rate and regular rhythm.  Pulmonary:     Effort: No respiratory distress.     Breath sounds: No wheezing.  Abdominal:     General: Bowel sounds are normal. There is no distension.     Palpations: Abdomen is soft. There is no mass.     Tenderness: There is no abdominal tenderness. There is no guarding or rebound.  Musculoskeletal:        General: No tenderness. Normal range of motion.     Cervical back: Normal range of motion and neck supple.  Skin:    General: Skin is warm.  Neurological:     Mental Status: He is alert and oriented to person, place, and time.  Psychiatric:        Mood and Affect: Affect normal.     LABORATORY DATA:  I have reviewed the data as listed    Component Value Date/Time   NA 137 02/08/2024 1020   NA 132 (L) 01/07/2015 1052   K 4.4 02/08/2024 1020   K 4.3  01/07/2015 1052   CL 105 02/08/2024 1020   CL 101 01/07/2015 1052   CO2 24 02/08/2024 1020   CO2 23 01/07/2015 1052   GLUCOSE 76 02/08/2024 1020   GLUCOSE 404 (H) 01/07/2015 1052   BUN 20 02/08/2024 1020   BUN 14 01/07/2015 1052   CREATININE 1.12 02/08/2024 1020   CREATININE 0.99 01/07/2015 1052   CALCIUM 10.0 02/08/2024 1020   CALCIUM 9.2 01/07/2015 1052   PROT 7.9 02/08/2024 1020   PROT 7.7 01/07/2015 1052   ALBUMIN 4.1 02/08/2024 1020   ALBUMIN 4.3 01/07/2015 1052   AST 47 (H) 02/08/2024 1020   ALT 48 (H) 02/08/2024 1020   ALT 51 01/07/2015 1052   ALKPHOS 47 02/08/2024 1020   ALKPHOS 64 01/07/2015 1052   BILITOT 0.8 02/08/2024 1020   GFRNONAA >60 02/08/2024 1020   GFRNONAA >60 01/07/2015 1052   GFRAA >60 03/19/2020 0931   GFRAA >60 01/07/2015 1052    No results found for: "SPEP", "UPEP"  Lab Results  Component Value Date   WBC 11.9 (H) 02/08/2024   NEUTROABS 6.0 02/08/2024   HGB 14.6 02/08/2024   HCT 43.0 02/08/2024   MCV 89.2 02/08/2024   PLT 228 02/08/2024      Chemistry      Component Value Date/Time   NA 137 02/08/2024 1020   NA 132 (L) 01/07/2015 1052   K 4.4 02/08/2024 1020   K 4.3 01/07/2015 1052   CL 105 02/08/2024 1020   CL 101 01/07/2015 1052   CO2 24 02/08/2024 1020   CO2 23 01/07/2015 1052   BUN 20 02/08/2024 1020   BUN 14 01/07/2015 1052   CREATININE 1.12 02/08/2024 1020   CREATININE 0.99 01/07/2015 1052      Component Value Date/Time   CALCIUM 10.0 02/08/2024 1020   CALCIUM 9.2 01/07/2015 1052   ALKPHOS 47 02/08/2024 1020   ALKPHOS 64 01/07/2015 1052   AST 47 (H) 02/08/2024 1020   ALT 48 (H) 02/08/2024 1020   ALT 51 01/07/2015 1052   BILITOT 0.8 02/08/2024 1020  ASSESSMENT & PLAN:   Cancer of sigmoid colon [name]) # 2-14- Colon Cancer- stage III- on CALGB [address]; intolerance to celecoxib/placebo discontinued in Oct 2016 secondary to elevated blood sugars.  # [facility] CT in APRIL 2024 [abdominal pain]- showed no evidence of  disease. Colonoscopy 08/2022 Four 3 to 5 mm polyps in the descending colon and in  the ascending colon,Stool in the ascending colon and in the cecum; Path w/ TAs - 1 year repeat recommended- will refer to [facility]  # APRIL 2024- Small enhancing and enlarging lesion exophytic from the LEFT kidney is most consistent with a small renal neoplasm (benign or malignant)-  s/p Dr.Stoioff- JUNE 2024- Left Bosniak I benign renal cyst measuring 2.6 cm- cyst- stable- no further follow up needed  # Diabetes Mellitus- labile-  Continue follow-up with endocrinology [Dr.Solum]. stable.   # CAD s/p defibrillator [[facility]]   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 stable.   # AST/ALT- mild elevation- sec to fatty liver- recommend weight loss.   #Since patient is clinically stable I think is reasonable for the patient to follow-up with PCP/can follow-up with us  as needed.  Patient comfortable with the plan; to call us  if any questions or concerns in the interim.   Disposition:  # refer to [facility] GI re: colonoscopy- Hx of colon cancer # Follow up as needed-Dr. B   # 25 minutes face-to-face with the patient discussing the above plan of care; more than 50% of time spent on prognosis/ natural history; counseling and coordination.    Gwyn Leos, MD 02/08/2024 11:23 AM

## 2024-02-08 NOTE — Assessment & Plan Note (Signed)
#   2-14- Colon Cancer- stage III- on CALGB 81191; intolerance to celecoxib/placebo discontinued in Oct 2016 secondary to elevated blood sugars.  # [facility] CT in APRIL 2024 [abdominal pain]- showed no evidence of disease. Colonoscopy 08/2022 Four 3 to 5 mm polyps in the descending colon and in  the ascending colon,Stool in the ascending colon and in the cecum; Path w/ TAs - 1 year repeat recommended- will refer to [facility]  # APRIL 2024- Small enhancing and enlarging lesion exophytic from the LEFT kidney is most consistent with a small renal neoplasm (benign or malignant)-  s/p Dr.Stoioff- JUNE 2024- Left Bosniak I benign renal cyst measuring 2.6 cm- cyst- stable- no further follow up needed  # Diabetes Mellitus- labile-  Continue follow-up with endocrinology [Dr.Solum]. stable.   # CAD s/p defibrillator [[facility]]   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 stable.   # AST/ALT- mild elevation- sec to fatty liver- recommend weight loss.   #Since patient is clinically stable I think is reasonable for the patient to follow-up with PCP/can follow-up with us  as needed.  Patient comfortable with the plan; to call us  if any questions or concerns in the interim.   Disposition:  # refer to [facility] GI re: colonoscopy- Hx of colon cancer # Follow up as needed-Dr. B

## 2024-02-08 NOTE — Progress Notes (Signed)
 Since last visit pt states he has had his port switched from the left to the right side and has had an ECG 02/11/23 and chest xray 02/02/23 done at [facility].

## 2024-02-09 LAB — CEA: CEA: 1.6 ng/mL (ref 0.0–4.7)

## 2024-02-10 DIAGNOSIS — N1831 Chronic kidney disease, stage 3a: Secondary | ICD-10-CM | POA: Diagnosis not present

## 2024-02-10 DIAGNOSIS — Z794 Long term (current) use of insulin: Secondary | ICD-10-CM | POA: Diagnosis not present

## 2024-02-10 DIAGNOSIS — E119 Type 2 diabetes mellitus without complications: Secondary | ICD-10-CM | POA: Diagnosis not present

## 2024-02-10 DIAGNOSIS — E1122 Type 2 diabetes mellitus with diabetic chronic kidney disease: Secondary | ICD-10-CM | POA: Diagnosis not present

## 2024-02-10 DIAGNOSIS — I10 Essential (primary) hypertension: Secondary | ICD-10-CM | POA: Diagnosis not present

## 2024-02-10 DIAGNOSIS — E1165 Type 2 diabetes mellitus with hyperglycemia: Secondary | ICD-10-CM | POA: Diagnosis not present

## 2024-03-08 DIAGNOSIS — Z794 Long term (current) use of insulin: Secondary | ICD-10-CM | POA: Diagnosis not present

## 2024-03-08 DIAGNOSIS — E538 Deficiency of other specified B group vitamins: Secondary | ICD-10-CM | POA: Diagnosis not present

## 2024-03-08 DIAGNOSIS — K5900 Constipation, unspecified: Secondary | ICD-10-CM | POA: Diagnosis not present

## 2024-03-08 DIAGNOSIS — T827XXD Infection and inflammatory reaction due to other cardiac and vascular devices, implants and grafts, subsequent encounter: Secondary | ICD-10-CM | POA: Diagnosis not present

## 2024-03-08 DIAGNOSIS — E08649 Diabetes mellitus due to underlying condition with hypoglycemia without coma: Secondary | ICD-10-CM | POA: Diagnosis not present

## 2024-03-08 DIAGNOSIS — Z0000 Encounter for general adult medical examination without abnormal findings: Secondary | ICD-10-CM | POA: Diagnosis not present

## 2024-03-08 DIAGNOSIS — I5022 Chronic systolic (congestive) heart failure: Secondary | ICD-10-CM | POA: Diagnosis not present

## 2024-03-08 DIAGNOSIS — I420 Dilated cardiomyopathy: Secondary | ICD-10-CM | POA: Diagnosis not present

## 2024-03-08 DIAGNOSIS — R5383 Other fatigue: Secondary | ICD-10-CM | POA: Diagnosis not present

## 2024-03-08 DIAGNOSIS — C189 Malignant neoplasm of colon, unspecified: Secondary | ICD-10-CM | POA: Diagnosis not present

## 2024-03-08 DIAGNOSIS — E782 Mixed hyperlipidemia: Secondary | ICD-10-CM | POA: Diagnosis not present

## 2024-03-08 DIAGNOSIS — I10 Essential (primary) hypertension: Secondary | ICD-10-CM | POA: Diagnosis not present

## 2024-03-08 DIAGNOSIS — E1165 Type 2 diabetes mellitus with hyperglycemia: Secondary | ICD-10-CM | POA: Diagnosis not present

## 2024-03-10 DIAGNOSIS — E538 Deficiency of other specified B group vitamins: Secondary | ICD-10-CM | POA: Diagnosis not present

## 2024-03-13 DIAGNOSIS — E1165 Type 2 diabetes mellitus with hyperglycemia: Secondary | ICD-10-CM | POA: Diagnosis not present

## 2024-03-13 DIAGNOSIS — Z794 Long term (current) use of insulin: Secondary | ICD-10-CM | POA: Diagnosis not present

## 2024-03-17 DIAGNOSIS — E538 Deficiency of other specified B group vitamins: Secondary | ICD-10-CM | POA: Diagnosis not present

## 2024-03-24 DIAGNOSIS — E538 Deficiency of other specified B group vitamins: Secondary | ICD-10-CM | POA: Diagnosis not present

## 2024-03-31 DIAGNOSIS — E538 Deficiency of other specified B group vitamins: Secondary | ICD-10-CM | POA: Diagnosis not present

## 2024-04-11 DIAGNOSIS — I420 Dilated cardiomyopathy: Secondary | ICD-10-CM | POA: Diagnosis not present

## 2024-04-19 DIAGNOSIS — R1032 Left lower quadrant pain: Secondary | ICD-10-CM | POA: Diagnosis not present

## 2024-04-19 DIAGNOSIS — D6489 Other specified anemias: Secondary | ICD-10-CM | POA: Diagnosis not present

## 2024-04-19 DIAGNOSIS — E538 Deficiency of other specified B group vitamins: Secondary | ICD-10-CM | POA: Diagnosis not present

## 2024-04-19 DIAGNOSIS — K5900 Constipation, unspecified: Secondary | ICD-10-CM | POA: Diagnosis not present

## 2024-04-19 DIAGNOSIS — R1012 Left upper quadrant pain: Secondary | ICD-10-CM | POA: Diagnosis not present

## 2024-05-01 DIAGNOSIS — R0789 Other chest pain: Secondary | ICD-10-CM | POA: Diagnosis not present

## 2024-05-01 DIAGNOSIS — M546 Pain in thoracic spine: Secondary | ICD-10-CM | POA: Diagnosis not present

## 2024-05-01 DIAGNOSIS — R0781 Pleurodynia: Secondary | ICD-10-CM | POA: Diagnosis not present

## 2024-05-05 DIAGNOSIS — E538 Deficiency of other specified B group vitamins: Secondary | ICD-10-CM | POA: Diagnosis not present

## 2024-05-09 DIAGNOSIS — E113393 Type 2 diabetes mellitus with moderate nonproliferative diabetic retinopathy without macular edema, bilateral: Secondary | ICD-10-CM | POA: Diagnosis not present

## 2024-05-15 DIAGNOSIS — E66812 Obesity, class 2: Secondary | ICD-10-CM | POA: Diagnosis not present

## 2024-05-15 DIAGNOSIS — N1831 Chronic kidney disease, stage 3a: Secondary | ICD-10-CM | POA: Diagnosis not present

## 2024-05-15 DIAGNOSIS — Z1331 Encounter for screening for depression: Secondary | ICD-10-CM | POA: Diagnosis not present

## 2024-05-15 DIAGNOSIS — E1169 Type 2 diabetes mellitus with other specified complication: Secondary | ICD-10-CM | POA: Diagnosis not present

## 2024-05-15 DIAGNOSIS — I10 Essential (primary) hypertension: Secondary | ICD-10-CM | POA: Diagnosis not present

## 2024-05-15 DIAGNOSIS — Z794 Long term (current) use of insulin: Secondary | ICD-10-CM | POA: Diagnosis not present

## 2024-05-15 DIAGNOSIS — E669 Obesity, unspecified: Secondary | ICD-10-CM | POA: Diagnosis not present

## 2024-05-15 DIAGNOSIS — E1165 Type 2 diabetes mellitus with hyperglycemia: Secondary | ICD-10-CM | POA: Diagnosis not present

## 2024-05-15 DIAGNOSIS — E1122 Type 2 diabetes mellitus with diabetic chronic kidney disease: Secondary | ICD-10-CM | POA: Diagnosis not present

## 2024-06-06 DIAGNOSIS — E538 Deficiency of other specified B group vitamins: Secondary | ICD-10-CM | POA: Diagnosis not present

## 2024-06-07 DIAGNOSIS — Z95810 Presence of automatic (implantable) cardiac defibrillator: Secondary | ICD-10-CM | POA: Diagnosis not present

## 2024-06-07 DIAGNOSIS — I5022 Chronic systolic (congestive) heart failure: Secondary | ICD-10-CM | POA: Diagnosis not present

## 2024-06-07 DIAGNOSIS — I420 Dilated cardiomyopathy: Secondary | ICD-10-CM | POA: Diagnosis not present

## 2024-06-07 DIAGNOSIS — E782 Mixed hyperlipidemia: Secondary | ICD-10-CM | POA: Diagnosis not present

## 2024-06-07 DIAGNOSIS — I10 Essential (primary) hypertension: Secondary | ICD-10-CM | POA: Diagnosis not present

## 2024-06-13 DIAGNOSIS — K5900 Constipation, unspecified: Secondary | ICD-10-CM | POA: Diagnosis not present

## 2024-06-13 DIAGNOSIS — E118 Type 2 diabetes mellitus with unspecified complications: Secondary | ICD-10-CM | POA: Diagnosis not present

## 2024-06-13 DIAGNOSIS — R109 Unspecified abdominal pain: Secondary | ICD-10-CM | POA: Diagnosis not present

## 2024-06-21 ENCOUNTER — Other Ambulatory Visit: Payer: Self-pay | Admitting: Family Medicine

## 2024-06-21 DIAGNOSIS — R109 Unspecified abdominal pain: Secondary | ICD-10-CM

## 2024-06-22 ENCOUNTER — Ambulatory Visit
Admission: RE | Admit: 2024-06-22 | Discharge: 2024-06-22 | Disposition: A | Discharge status: Home / Self Care | Source: Ambulatory Visit | Attending: Family Medicine | Admitting: Family Medicine

## 2024-06-22 DIAGNOSIS — R109 Unspecified abdominal pain: Secondary | ICD-10-CM | POA: Insufficient documentation

## 2024-06-22 DIAGNOSIS — R1084 Generalized abdominal pain: Secondary | ICD-10-CM | POA: Diagnosis not present

## 2024-06-22 DIAGNOSIS — K7689 Other specified diseases of liver: Secondary | ICD-10-CM | POA: Diagnosis not present

## 2024-06-22 DIAGNOSIS — K5730 Diverticulosis of large intestine without perforation or abscess without bleeding: Secondary | ICD-10-CM | POA: Diagnosis not present

## 2024-06-22 DIAGNOSIS — N281 Cyst of kidney, acquired: Secondary | ICD-10-CM | POA: Diagnosis not present

## 2024-06-22 LAB — POCT I-STAT CREATININE: Creatinine, Ser: 1.2 mg/dL (ref 0.61–1.24)

## 2024-06-22 MED ORDER — IOHEXOL 300 MG/ML  SOLN
100.0000 mL | Freq: Once | INTRAMUSCULAR | Status: AC | PRN
  Administered 2024-06-22: 100 mL via INTRAVENOUS

## 2024-06-22 MED ORDER — BARIUM SULFATE 2 % PO SUSP
450.0000 mL | Freq: Once | ORAL | Status: AC
  Administered 2024-06-22: 450 mL via ORAL

## 2024-07-07 DIAGNOSIS — E538 Deficiency of other specified B group vitamins: Secondary | ICD-10-CM | POA: Diagnosis not present

## 2024-07-11 DIAGNOSIS — I420 Dilated cardiomyopathy: Secondary | ICD-10-CM | POA: Diagnosis not present

## 2024-08-04 DIAGNOSIS — E538 Deficiency of other specified B group vitamins: Secondary | ICD-10-CM | POA: Diagnosis not present

## 2024-08-08 DIAGNOSIS — K760 Fatty (change of) liver, not elsewhere classified: Secondary | ICD-10-CM | POA: Diagnosis not present

## 2024-08-08 DIAGNOSIS — Z85038 Personal history of other malignant neoplasm of large intestine: Secondary | ICD-10-CM | POA: Diagnosis not present

## 2024-08-08 DIAGNOSIS — R935 Abnormal findings on diagnostic imaging of other abdominal regions, including retroperitoneum: Secondary | ICD-10-CM | POA: Diagnosis not present

## 2024-08-15 DIAGNOSIS — E66812 Obesity, class 2: Secondary | ICD-10-CM | POA: Diagnosis not present

## 2024-08-15 DIAGNOSIS — N1831 Chronic kidney disease, stage 3a: Secondary | ICD-10-CM | POA: Diagnosis not present

## 2024-08-15 DIAGNOSIS — R935 Abnormal findings on diagnostic imaging of other abdominal regions, including retroperitoneum: Secondary | ICD-10-CM | POA: Diagnosis not present

## 2024-08-15 DIAGNOSIS — K760 Fatty (change of) liver, not elsewhere classified: Secondary | ICD-10-CM | POA: Diagnosis not present

## 2024-08-15 DIAGNOSIS — Z794 Long term (current) use of insulin: Secondary | ICD-10-CM | POA: Diagnosis not present

## 2024-08-15 DIAGNOSIS — E1165 Type 2 diabetes mellitus with hyperglycemia: Secondary | ICD-10-CM | POA: Diagnosis not present

## 2024-08-15 DIAGNOSIS — I10 Essential (primary) hypertension: Secondary | ICD-10-CM | POA: Diagnosis not present

## 2024-08-15 DIAGNOSIS — E1122 Type 2 diabetes mellitus with diabetic chronic kidney disease: Secondary | ICD-10-CM | POA: Diagnosis not present

## 2024-08-25 DIAGNOSIS — K760 Fatty (change of) liver, not elsewhere classified: Secondary | ICD-10-CM | POA: Diagnosis not present

## 2024-08-25 DIAGNOSIS — R935 Abnormal findings on diagnostic imaging of other abdominal regions, including retroperitoneum: Secondary | ICD-10-CM | POA: Diagnosis not present

## 2024-08-25 DIAGNOSIS — R7689 Other specified abnormal immunological findings in serum: Secondary | ICD-10-CM | POA: Diagnosis not present

## 2024-09-10 NOTE — H&P (Signed)
 Pre-Procedure H&P   Patient ID: [name] [name] is a 68 y.o. male.  Gastroenterology Provider: Elspeth Ozell Jungling, DO  Referring Provider: Romero Antigua, PA PCP: Valora Lynwood FALCON, MD  Date: 09/11/2024  HPI [name] [name] is a 68 y.o. male who presents today for Colonoscopy for Personal history colon polyps, personal history of colon cancer, family history of colon cancer .  Patient with a personal history of colon cancer in 2014.  Mother with history of colon cancer.  Underwent colonoscopy in March 2023 with poor prep-at that time 6 tubular adenomas and 1 tubulovillous adenoma was removed.  Repeat colonoscopy in November 2023 with normal TI, internal hemorrhoids and 5 adenomatous polyps removed.  Stool was present in the ascending colon.  Denies any current melena or hematochezia.  Left greater than right sided abdominal pain.  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  Status post PPM/AICD.  CHF with a EF of 25%   Past Medical History:  Diagnosis Date   AICD (automatic cardioverter/defibrillator) present 04/20/2014   Medtronic biventricular ICD model Viva CRT-D.   Cardiac arrhythmia 06/22/2013   Cardiomyopathy (HCC) 06/22/2013   CHF (congestive heart failure) (HCC)    Colon cancer (HCC) 2014   Partial Colon Resection.   Diabetes mellitus without complication [facility])    Patient takes Metformin   Hyperlipidemia    Hypertension    Neck pain on right side 07/16/2015    Past Surgical History:  Procedure Laterality Date   Biventricular implantable cardioverter-defbrillator Left 04/27/2014   Medtronic biventricular ICD model Consulta CRTD 224 TRK, serial #[id number]. The right atrial lead is Medtronic [id number], serial # [id number].  The right ventrcular lead is Medtronic [id number], serial # [id number] and the LV lead is [id number] Medtronic serial # [id number]   BLADDER SURGERY N/A 06/22/2013    bladder stretched   CARDIAC CATHETERIZATION     CHOLECYSTECTOMY  01/22/2014   Dr. Claudene   COLECTOMY Left 05/25/2013   sigmoid colectomy   COLONOSCOPY WITH PROPOFOL  N/A 08/03/2016   Procedure: COLONOSCOPY WITH PROPOFOL ;  Surgeon: Lamar ONEIDA Holmes, MD;  Location: [facility] ENDOSCOPY;  Service: Endoscopy;  Laterality: N/A;   COLONOSCOPY WITH PROPOFOL  N/A 09/21/2018   Procedure: COLONOSCOPY WITH PROPOFOL ;  Surgeon: Holmes Lamar ONEIDA, MD;  Location: [facility] ENDOSCOPY;  Service: Endoscopy;  Laterality: N/A;   COLONOSCOPY WITH PROPOFOL  N/A 12/25/2021   Procedure: COLONOSCOPY WITH PROPOFOL ;  Surgeon: Jungling Elspeth Ozell, DO;  Location: [facility] ENDOSCOPY;  Service: Gastroenterology;  Laterality: N/A;  IDDM   COLONOSCOPY WITH PROPOFOL  N/A 08/24/2022   Procedure: COLONOSCOPY WITH PROPOFOL ;  Surgeon: Jungling Elspeth Ozell, DO;  Location: [facility] ENDOSCOPY;  Service: Gastroenterology;  Laterality: N/A;  DM   ICD GENERATOR CHANGEOUT N/A 11/22/2020   Procedure: ICD GENERATOR CHANGEOUT;  Surgeon: Debby Franky CROME, MD;  Location: [facility] INVASIVE CV LAB;  Service: Cardiovascular;  Laterality: N/A;   INSERTION CENTRAL VENOUS ACCESS DEVICE W/ SUBCUTANEOUS PORT  2014   Dr. claudene FRIED CUFF REPAIR Left 08/2003    Family History Mother- crc No other h/o GI disease or malignancy  Review of Systems  Constitutional:  Negative for activity change, appetite change, chills, diaphoresis, fatigue, fever and unexpected weight change.  HENT:  Negative for trouble swallowing and voice change.   Respiratory:  Negative for shortness of breath and wheezing.   Cardiovascular:  Negative for chest pain, palpitations and leg swelling.  Gastrointestinal:  Positive for abdominal pain. Negative for abdominal distention, anal bleeding, blood in stool, constipation, diarrhea, nausea and vomiting.  Musculoskeletal:  Negative for arthralgias and myalgias.  Skin:  Negative for color change and pallor.  Neurological:  Negative for  dizziness, syncope and weakness.  Psychiatric/Behavioral:  Negative for confusion. The patient is not nervous/anxious.   All other systems reviewed and are negative.    Medications No current facility-administered medications on file prior to encounter.   Current Outpatient Medications on File Prior to Encounter  Medication Sig Dispense Refill   carvedilol (COREG) 3.125 MG tablet Take 3.125 mg by mouth 2 (two) times daily with a meal.      FARXIGA 10 MG TABS tablet Take 10 mg by mouth every morning.     furosemide (LASIX) 20 MG tablet Take 10 mg by mouth daily as needed for edema.     glimepiride (AMARYL) 1 MG tablet Take 2 mg by mouth 2 (two) times daily. Take 2 tablets twice daily     metFORMIN (GLUCOPHAGE) 500 MG tablet Take 1,000 mg by mouth 2 (two) times daily with a meal.     NOVOLOG MIX 70/30 FLEXPEN (70-30) 100 UNIT/ML FlexPen Inject 45-50 Units into the skin See admin instructions. 45 units in the morning, 50 units at bedtime     olmesartan-hydrochlorothiazide (BENICAR HCT) 20-12.5 MG per tablet Take 0.5 tablets by mouth daily.     simvastatin (ZOCOR) 40 MG tablet Take 40 mg by mouth daily. Once daily at bedtime     sitaGLIPtin (JANUVIA) 100 MG tablet Take 100 mg by mouth daily. In the morning     traMADol (ULTRAM) 50 MG tablet Take by mouth every 6 (six) hours as needed.     glucose blood test strip Use 2 (two) times daily. Use as instructed.     Hyoscyamine Sulfate SL 0.125 MG SUBL Place under the tongue.      Pertinent medications related to GI and procedure were reviewed by me with the patient prior to the procedure   Current Facility-Administered Medications:    0.9 %  sodium chloride  infusion, , Intravenous, Continuous, Onita Elspeth Sharper, DO, Last Rate: 20 mL/hr at 09/11/24 0912, New Bag at 09/11/24 0912  sodium chloride  20 mL/hr at 09/11/24 9087       No Known Allergies Allergies were reviewed by me prior to the procedure  Objective   Body mass index is 36.62  kg/m. Vitals:   09/11/24 0858  BP: 129/85  Pulse: 90  Resp: 13  Temp: (!) 96.2 F (35.7 C)  TempSrc: Temporal  SpO2: 99%  Weight: 112.5 kg  Height: 5' 9 (1.753 m)     Physical Exam Vitals and nursing note reviewed.  Constitutional:      General: He is not in acute distress.    Appearance: Normal appearance. He is not ill-appearing, toxic-appearing or diaphoretic.  HENT:     Head: Normocephalic and atraumatic.     Nose: Nose normal.     Mouth/Throat:     Mouth: Mucous membranes are moist.     Pharynx: Oropharynx is clear.  Eyes:     General: No scleral icterus.    Extraocular Movements: Extraocular movements intact.  Cardiovascular:     Rate and Rhythm: Normal rate and regular rhythm.     Comments: PPM/AICD R chest wall Pulmonary:     Effort: Pulmonary effort is normal. No respiratory distress.     Breath sounds: Normal breath sounds. No wheezing, rhonchi or rales.  Abdominal:     General: Bowel sounds are normal. There is no distension.     Palpations: Abdomen is soft.     Tenderness: There is no abdominal tenderness. There is no guarding or rebound.  Musculoskeletal:     Cervical back: Neck supple.     Right lower leg: No edema.     Left lower leg: No edema.  Skin:    General: Skin is warm and dry.     Coloration: Skin is not jaundiced or pale.  Neurological:     General: No focal deficit present.     Mental Status: He is alert and oriented to person, place, and time. Mental status is at baseline.  Psychiatric:        Mood and Affect: Mood normal.        Behavior: Behavior normal.        Thought Content: Thought content normal.        Judgment: Judgment normal.      Assessment:  [name] is a 68 y.o. male  who presents today for Colonoscopy for Personal history colon polyps, personal history of colon cancer, family history of colon cancer .  Plan:  Colonoscopy with possible intervention today  Colonoscopy with possible biopsy, control of  bleeding, polypectomy, and interventions as necessary has been discussed with the patient/patient representative. Informed consent was obtained from the patient/patient representative after explaining the indication, nature, and risks of the procedure including but not limited to death, bleeding, perforation, missed neoplasm/lesions, cardiorespiratory compromise, and reaction to medications. Opportunity for questions was given and appropriate answers were provided. Patient/patient representative has verbalized understanding is amenable to undergoing the procedure.   Elspeth Ozell Jungling, DO  [facility]  Portions of the record may have been created with voice recognition software. Occasional wrong-word or 'sound-a-like' substitutions may have occurred due to the inherent limitations of voice recognition software.  Read the chart carefully and recognize, using context, where substitutions may have occurred.

## 2024-09-11 ENCOUNTER — Encounter: Payer: Self-pay | Admitting: Gastroenterology

## 2024-09-11 ENCOUNTER — Other Ambulatory Visit: Payer: Self-pay

## 2024-09-11 ENCOUNTER — Encounter: Admission: RE | Disposition: A | Payer: Self-pay | Source: Home / Self Care | Attending: Gastroenterology

## 2024-09-11 ENCOUNTER — Ambulatory Visit
Admission: RE | Admit: 2024-09-11 | Discharge: 2024-09-11 | Disposition: A | Attending: Gastroenterology | Admitting: Gastroenterology

## 2024-09-11 ENCOUNTER — Ambulatory Visit: Admitting: Anesthesiology

## 2024-09-11 HISTORY — PX: POLYPECTOMY: SHX149

## 2024-09-11 HISTORY — PX: COLONOSCOPY: SHX5424

## 2024-09-11 LAB — GLUCOSE, CAPILLARY: Glucose-Capillary: 253 mg/dL — ABNORMAL HIGH (ref 70–99)

## 2024-09-11 SURGERY — COLONOSCOPY
Anesthesia: General

## 2024-09-11 MED ORDER — SODIUM CHLORIDE 0.9 % IV SOLN: INTRAVENOUS | Status: DC

## 2024-09-11 MED ORDER — PHENYLEPHRINE 80 MCG/ML (10ML) SYRINGE FOR IV PUSH (FOR BLOOD PRESSURE SUPPORT)
PREFILLED_SYRINGE | INTRAVENOUS | Status: DC | PRN
  Administered 2024-09-11: 160 ug via INTRAVENOUS
  Administered 2024-09-11: 240 ug via INTRAVENOUS

## 2024-09-11 MED ORDER — LIDOCAINE HCL (PF) 2 % IJ SOLN
INTRAMUSCULAR | Status: AC
  Filled 2024-09-11: qty 5

## 2024-09-11 MED ORDER — DEXMEDETOMIDINE HCL IN NACL 80 MCG/20ML IV SOLN
INTRAVENOUS | Status: DC | PRN
  Administered 2024-09-11: 12 ug via INTRAVENOUS
  Administered 2024-09-11: 8 ug via INTRAVENOUS

## 2024-09-11 MED ORDER — PROPOFOL 500 MG/50ML IV EMUL
INTRAVENOUS | Status: DC | PRN
  Administered 2024-09-11: 75 ug/kg/min via INTRAVENOUS

## 2024-09-11 MED ORDER — PHENYLEPHRINE 80 MCG/ML (10ML) SYRINGE FOR IV PUSH (FOR BLOOD PRESSURE SUPPORT)
PREFILLED_SYRINGE | INTRAVENOUS | Status: AC
  Filled 2024-09-11: qty 20

## 2024-09-11 MED ORDER — PROPOFOL 10 MG/ML IV BOLUS
INTRAVENOUS | Status: DC | PRN
  Administered 2024-09-11 (×2): 50 mg via INTRAVENOUS

## 2024-09-11 MED ORDER — LIDOCAINE HCL (CARDIAC) PF 100 MG/5ML IV SOSY
PREFILLED_SYRINGE | INTRAVENOUS | Status: DC | PRN
  Administered 2024-09-11: 80 mg via INTRAVENOUS

## 2024-09-11 MED ORDER — EPHEDRINE SULFATE-NACL 50-0.9 MG/10ML-% IV SOSY
PREFILLED_SYRINGE | INTRAVENOUS | Status: DC | PRN
  Administered 2024-09-11: 15 mg via INTRAVENOUS
  Administered 2024-09-11: 10 mg via INTRAVENOUS

## 2024-09-11 NOTE — Anesthesia Preprocedure Evaluation (Addendum)
 Anesthesia Evaluation  Patient identified by MRN, date of birth, ID band Patient awake    Reviewed: Allergy & Precautions, NPO status , Patient's Chart, lab work & pertinent test results  History of Anesthesia Complications Negative for: history of anesthetic complications  Airway Mallampati: III  TM Distance: >3 FB Neck ROM: full    Dental  (+) Caps   Pulmonary neg pulmonary ROS   Pulmonary exam normal        Cardiovascular hypertension, On Medications +CHF (ef 20%)  Normal cardiovascular exam+ Cardiac Defibrillator + Valvular Problems/Murmurs (tr)      Neuro/Psych negative neurological ROS  negative psych ROS   GI/Hepatic negative GI ROS, Neg liver ROS,,,  Endo/Other  diabetes, Type 2, Oral Hypoglycemic Agents    Renal/GU negative Renal ROS  negative genitourinary   Musculoskeletal   Abdominal   Peds  Hematology negative hematology ROS (+)   Anesthesia Other Findings Past Medical History: 04/20/2014: AICD (automatic cardioverter/defibrillator) present     Comment:  Medtronic biventricular ICD model Viva CRT-D. 06/22/2013: Cardiac arrhythmia 06/22/2013: Cardiomyopathy (HCC) No date: CHF (congestive heart failure) (HCC) 2014: Colon cancer (HCC)     Comment:  Partial Colon Resection. No date: Diabetes mellitus without complication (HCC)     Comment:  Patient takes Metformin No date: Hyperlipidemia No date: Hypertension 07/16/2015: Neck pain on right side  Past Surgical History: 04/27/2014: Biventricular implantable cardioverter-defbrillator; Left     Comment:  Medtronic biventricular ICD model Consulta CRTD 224 TRK,              serial #[id number]. The right atrial lead is Medtronic               [id number], serial # [id number].  The right ventrcular lead is               Medtronic [id number], serial # [id number] and the LV lead is               [id number] Medtronic serial # [id number] 06/22/2013: BLADDER SURGERY; N/A      Comment:  bladder stretched No date: CARDIAC CATHETERIZATION 01/22/2014: CHOLECYSTECTOMY     Comment:  Dr. Claudene 05/25/2013: COLECTOMY; Left     Comment:  sigmoid colectomy 08/03/2016: COLONOSCOPY WITH PROPOFOL ; N/A     Comment:  Procedure: COLONOSCOPY WITH PROPOFOL ;  Surgeon: Lamar ONEIDA Holmes, MD;  Location: [facility] ENDOSCOPY;  Service:               Endoscopy;  Laterality: N/A; 09/21/2018: COLONOSCOPY WITH PROPOFOL ; N/A     Comment:  Procedure: COLONOSCOPY WITH PROPOFOL ;  Surgeon: Holmes Lamar ONEIDA, MD;  Location: [facility] ENDOSCOPY;  Service:               Endoscopy;  Laterality: N/A; 12/25/2021: COLONOSCOPY WITH PROPOFOL ; N/A     Comment:  Procedure: COLONOSCOPY WITH PROPOFOL ;  Surgeon: Onita Elspeth Sharper, DO;  Location: [facility] ENDOSCOPY;  Service:               Gastroenterology;  Laterality: N/A;  IDDM 08/24/2022: COLONOSCOPY WITH PROPOFOL ; N/A     Comment:  Procedure: COLONOSCOPY WITH PROPOFOL ;  Surgeon: Onita Elspeth Sharper, DO;  Location: [facility] ENDOSCOPY;  Service:  Gastroenterology;  Laterality: N/A;  DM 11/22/2020: ICD GENERATOR CHANGEOUT; N/A     Comment:  Procedure: ICD GENERATOR CHANGEOUT;  Surgeon: Debby Franky CROME, MD;  Location: [facility] INVASIVE CV LAB;  Service:               Cardiovascular;  Laterality: N/A; 2014: INSERTION CENTRAL VENOUS ACCESS DEVICE W/ SUBCUTANEOUS PORT     Comment:  Dr. claudene 08/2003: ROTATOR CUFF REPAIR; Left  BMI    Body Mass Index: 36.62 kg/m      Reproductive/Obstetrics negative OB ROS                              Anesthesia Physical Anesthesia Plan  ASA: 4  Anesthesia Plan: General   Post-op Pain Management: Minimal or no pain anticipated   Induction: Intravenous  PONV Risk Score and Plan: 1 and Propofol  infusion and TIVA  Airway Management Planned: Natural Airway and Nasal Cannula  Additional Equipment:   Intra-op Plan:    Post-operative Plan:   Informed Consent: I have reviewed the patients History and Physical, chart, labs and discussed the procedure including the risks, benefits and alternatives for the proposed anesthesia with the patient or authorized representative who has indicated his/her understanding and acceptance.     Dental Advisory Given  Plan Discussed with: Anesthesiologist, CRNA and Surgeon  Anesthesia Plan Comments: (Patient consented for risks of anesthesia including but not limited to:  - adverse reactions to medications - risk of airway placement if required - damage to eyes, teeth, lips or other oral mucosa - nerve damage due to positioning  - sore throat or hoarseness - Damage to heart, brain, nerves, lungs, other parts of body or loss of life  Patient voiced understanding and assent.)         Anesthesia Quick Evaluation

## 2024-09-11 NOTE — Op Note (Signed)
 [facility] Gastroenterology Patient Name: [name] [name] Procedure Date: 09/11/2024 9:49 AM MRN: [id number] Account #: [id number] Date of Birth: 01/18/1956 Admit Type: Outpatient Age: 68 Room: [facility] ENDO ROOM 2 Gender: Male Note Status: Finalized Instrument Name: Colon Scope [id number] Procedure:             Colonoscopy Indications:           High risk colon cancer surveillance: Personal history                         of colon cancer Providers:             Elspeth Ozell Onita ROSALEA, DO Referring MD:          Elspeth Ozell Onita DO, DO (Referring MD), Lynwood FALCON.                         Valora, MD (Referring MD) Medicines:             Monitored Anesthesia Care Complications:         No immediate complications. Estimated blood loss:                         Minimal. Procedure:             Pre-Anesthesia Assessment:                        - Prior to the procedure, a History and Physical was                         performed, and patient medications and allergies were                         reviewed. The patient is competent. The risks and                         benefits of the procedure and the sedation options and                         risks were discussed with the patient. All questions                         were answered and informed consent was obtained.                         Patient identification and proposed procedure were                         verified by the physician, the nurse, the anesthetist                         and the technician in the endoscopy suite. Mental                         Status Examination: alert and oriented. Airway                         Examination: normal oropharyngeal airway and neck  mobility. Respiratory Examination: clear to                         auscultation. CV Examination: RRR, no murmurs, no S3                         or S4. Prophylactic Antibiotics: The patient does not                          require prophylactic antibiotics. Prior                         Anticoagulants: The patient has taken no anticoagulant                         or antiplatelet agents. ASA Grade Assessment: IV - A                         patient with severe systemic disease that is a                         constant threat to life. After reviewing the risks and                         benefits, the patient was deemed in satisfactory                         condition to undergo the procedure. The anesthesia                         plan was to use monitored anesthesia care (MAC).                         Immediately prior to administration of medications,                         the patient was re-assessed for adequacy to receive                         sedatives. The heart rate, respiratory rate, oxygen                         saturations, blood pressure, adequacy of pulmonary                         ventilation, and response to care were monitored                         throughout the procedure. The physical status of the                         patient was re-assessed after the procedure.                        After obtaining informed consent, the colonoscope was                         passed under direct vision. Throughout the procedure,  the patient's blood pressure, pulse, and oxygen                         saturations were monitored continuously. The                         Colonoscope was introduced through the anus and                         advanced to the the terminal ileum, with                         identification of the appendiceal orifice and IC                         valve. The colonoscopy was performed without                         difficulty. The patient tolerated the procedure well.                         The quality of the bowel preparation was good. The                         terminal ileum, ileocecal valve, appendiceal orifice,                         and  rectum were photographed. Findings:      The perianal and digital rectal examinations were normal. Pertinent       negatives include normal sphincter tone.      The terminal ileum appeared normal. Estimated blood loss: none.      Retroflexion in the right colon was performed.      Two sessile polyps were found in the transverse colon and ascending       colon. The polyps were 1 to 2 mm in size. These polyps were removed with       a jumbo cold forceps. Resection and retrieval were complete. Estimated       blood loss was minimal.      The exam was otherwise without abnormality on direct and retroflexion       views. Impression:            - The examined portion of the ileum was normal.                        - Two 1 to 2 mm polyps in the transverse colon and in                         the ascending colon, removed with a jumbo cold                         forceps. Resected and retrieved.                        - The examination was otherwise normal on direct and                         retroflexion views. Recommendation:        -  Patient has a contact number available for                         emergencies. The signs and symptoms of potential                         delayed complications were discussed with the patient.                         Return to normal activities tomorrow. Written                         discharge instructions were provided to the patient.                        - Discharge patient to home.                        - Resume previous diet.                        - Continue present medications.                        - Await pathology results.                        - Repeat colonoscopy in 5 years for surveillance based                         on pathology results.                        - Return to referring physician as previously                         scheduled.                        - The findings and recommendations were discussed with                          the patient. Procedure Code(s):     --- Professional ---                        [phone], Colonoscopy, flexible; with biopsy, single or                         multiple Diagnosis Code(s):     --- Professional ---                        S14.961, Personal history of other malignant neoplasm                         of large intestine                        D12.3, Benign neoplasm of transverse colon (hepatic                         flexure or splenic flexure)  D12.2, Benign neoplasm of ascending colon CPT copyright 2022 American Medical Association. All rights reserved. The codes documented in this report are preliminary and upon coder review may  be revised to meet current compliance requirements. Attending Participation:      I personally performed the entire procedure. Elspeth Jungling, DO Elspeth Ozell Jungling DO, DO 09/11/2024 10:21:53 AM This report has been signed electronically. Number of Addenda: 0 Note Initiated On: 09/11/2024 9:49 AM Scope Withdrawal Time: 0 hours 13 minutes 40 seconds  Total Procedure Duration: 0 hours 16 minutes 36 seconds  Estimated Blood Loss:  Estimated blood loss was minimal.      [facility]

## 2024-09-11 NOTE — Interval H&P Note (Signed)
 History and Physical Interval Note: Preprocedure H&P from 09/11/24  was reviewed and there was no interval change after seeing and examining the patient.  Written consent was obtained from the patient after discussion of risks, benefits, and alternatives. Patient has consented to proceed with Colonoscopy with possible intervention   09/11/2024 9:50 AM  [name] [name]  has presented today for surgery, with the diagnosis of Personal history of colon cancer (Z85.038).  The various methods of treatment have been discussed with the patient and family. After consideration of risks, benefits and other options for treatment, the patient has consented to  Procedure(s): COLONOSCOPY (N/A) as a surgical intervention.  The patient's history has been reviewed, patient examined, no change in status, stable for surgery.  I have reviewed the patient's chart and labs.  Questions were answered to the patient's satisfaction.     Elspeth Ozell Jungling

## 2024-09-11 NOTE — Transfer of Care (Signed)
 Immediate Anesthesia Transfer of Care Note  Patient: [name] [name]  Procedure(s) Performed: COLONOSCOPY POLYPECTOMY, INTESTINE  Patient Location: PACU  Anesthesia Type:General  Level of Consciousness: sedated  Airway & Oxygen Therapy: Patient Spontanous Breathing  Post-op Assessment: Report given to RN and Post -op Vital signs reviewed and stable  Post vital signs: Reviewed and stable  Last Vitals:  Vitals Value Taken Time  BP 117/85 09/11/24 10:22  Temp    Pulse 70 09/11/24 10:23  Resp 19 09/11/24 10:23  SpO2 96 % 09/11/24 10:23  Vitals shown include unfiled device data.  Last Pain:  Vitals:   09/11/24 0858  TempSrc: Temporal  PainSc: 0-No pain         Complications: No notable events documented.

## 2024-09-11 NOTE — Anesthesia Postprocedure Evaluation (Signed)
 Anesthesia Post Note  Patient: [name] [name]  Procedure(s) Performed: COLONOSCOPY POLYPECTOMY, INTESTINE  Patient location during evaluation: Endoscopy Anesthesia Type: General Level of consciousness: awake and alert Pain management: pain level controlled Vital Signs Assessment: post-procedure vital signs reviewed and stable Respiratory status: spontaneous breathing, nonlabored ventilation, respiratory function stable and patient connected to nasal cannula oxygen Cardiovascular status: blood pressure returned to baseline and stable Postop Assessment: no apparent nausea or vomiting Anesthetic complications: no   No notable events documented.   Last Vitals:  Vitals:   09/11/24 1032 09/11/24 1042  BP:  95/73  Pulse: 81 81  Resp: 20 (!) 21  Temp:    SpO2: 97% 97%    Last Pain:  Vitals:   09/11/24 1042  TempSrc:   PainSc: 0-No pain                 [name]

## 2024-09-12 LAB — SURGICAL PATHOLOGY
